# Patient Record
Sex: Female | Born: 1935 | Race: White | Hispanic: No | Smoking: Never smoker
Health system: Southern US, Community
[De-identification: ages and names within clinical notes are randomized; demographics above are authoritative.]

## PROBLEM LIST (undated history)

## (undated) ENCOUNTER — Inpatient Hospital Stay: Admission: AD | Payer: Medicare PPO | Source: Ambulatory Visit | Admitting: Internal Medicine

## (undated) DIAGNOSIS — I739 Peripheral vascular disease, unspecified: Secondary | ICD-10-CM

## (undated) DIAGNOSIS — I2510 Atherosclerotic heart disease of native coronary artery without angina pectoris: Secondary | ICD-10-CM

## (undated) DIAGNOSIS — L8990 Pressure ulcer of unspecified site, unspecified stage: Secondary | ICD-10-CM

## (undated) DIAGNOSIS — I219 Acute myocardial infarction, unspecified: Secondary | ICD-10-CM

## (undated) DIAGNOSIS — E785 Hyperlipidemia, unspecified: Secondary | ICD-10-CM

## (undated) DIAGNOSIS — I773 Arterial fibromuscular dysplasia: Secondary | ICD-10-CM

## (undated) DIAGNOSIS — I509 Heart failure, unspecified: Secondary | ICD-10-CM

## (undated) DIAGNOSIS — K219 Gastro-esophageal reflux disease without esophagitis: Secondary | ICD-10-CM

## (undated) DIAGNOSIS — K222 Esophageal obstruction: Secondary | ICD-10-CM

## (undated) DIAGNOSIS — D649 Anemia, unspecified: Secondary | ICD-10-CM

## (undated) DIAGNOSIS — K5730 Diverticulosis of large intestine without perforation or abscess without bleeding: Secondary | ICD-10-CM

## (undated) DIAGNOSIS — F419 Anxiety disorder, unspecified: Secondary | ICD-10-CM

## (undated) DIAGNOSIS — I10 Essential (primary) hypertension: Secondary | ICD-10-CM

## (undated) DIAGNOSIS — I4891 Unspecified atrial fibrillation: Secondary | ICD-10-CM

## (undated) DIAGNOSIS — S72001A Fracture of unspecified part of neck of right femur, initial encounter for closed fracture: Secondary | ICD-10-CM

## (undated) DIAGNOSIS — R0602 Shortness of breath: Secondary | ICD-10-CM

## (undated) DIAGNOSIS — J449 Chronic obstructive pulmonary disease, unspecified: Secondary | ICD-10-CM

## (undated) HISTORY — PX: CATARACT EXTRACTION: SUR2

## (undated) HISTORY — DX: Arterial fibromuscular dysplasia: I77.3

## (undated) HISTORY — DX: Essential (primary) hypertension: I10

## (undated) HISTORY — DX: Atherosclerotic heart disease of native coronary artery without angina pectoris: I25.10

## (undated) HISTORY — DX: Heart failure, unspecified: I50.9

## (undated) HISTORY — DX: Anemia, unspecified: D64.9

## (undated) HISTORY — DX: Gastro-esophageal reflux disease without esophagitis: K21.9

## (undated) HISTORY — DX: Esophageal obstruction: K22.2

## (undated) HISTORY — DX: Diverticulosis of large intestine without perforation or abscess without bleeding: K57.30

## (undated) HISTORY — DX: Pressure ulcer of unspecified site, unspecified stage: L89.90

## (undated) HISTORY — DX: Hyperlipidemia, unspecified: E78.5

## (undated) HISTORY — DX: Unspecified atrial fibrillation: I48.91

## (undated) HISTORY — DX: Chronic obstructive pulmonary disease, unspecified: J44.9

## (undated) HISTORY — DX: Peripheral vascular disease, unspecified: I73.9

## (undated) HISTORY — DX: Anxiety disorder, unspecified: F41.9

## (undated) HISTORY — DX: Fracture of unspecified part of neck of right femur, initial encounter for closed fracture: S72.001A

---

## 1998-10-08 ENCOUNTER — Other Ambulatory Visit: Admission: RE | Admit: 1998-10-08 | Discharge: 1998-10-08 | Payer: Self-pay | Admitting: Gastroenterology

## 1998-10-08 LAB — HM COLONOSCOPY

## 1999-05-29 ENCOUNTER — Encounter: Payer: Self-pay | Admitting: Emergency Medicine

## 1999-05-29 ENCOUNTER — Emergency Department (HOSPITAL_COMMUNITY): Admission: EM | Admit: 1999-05-29 | Discharge: 1999-05-29 | Payer: Self-pay | Admitting: Emergency Medicine

## 1999-10-16 ENCOUNTER — Ambulatory Visit (HOSPITAL_COMMUNITY): Admission: RE | Admit: 1999-10-16 | Discharge: 1999-10-16 | Payer: Self-pay | Admitting: Internal Medicine

## 1999-10-16 ENCOUNTER — Encounter: Payer: Self-pay | Admitting: Internal Medicine

## 2001-01-27 ENCOUNTER — Encounter: Payer: Self-pay | Admitting: Emergency Medicine

## 2001-01-27 ENCOUNTER — Emergency Department (HOSPITAL_COMMUNITY): Admission: EM | Admit: 2001-01-27 | Discharge: 2001-01-27 | Payer: Self-pay | Admitting: Emergency Medicine

## 2002-02-09 ENCOUNTER — Emergency Department (HOSPITAL_COMMUNITY): Admission: EM | Admit: 2002-02-09 | Discharge: 2002-02-09 | Payer: Self-pay | Admitting: Emergency Medicine

## 2002-10-23 ENCOUNTER — Encounter: Admission: RE | Admit: 2002-10-23 | Discharge: 2002-10-23 | Payer: Self-pay | Admitting: Internal Medicine

## 2002-10-23 ENCOUNTER — Encounter: Payer: Self-pay | Admitting: Internal Medicine

## 2003-04-21 HISTORY — PX: PTCA: SHX146

## 2003-05-16 ENCOUNTER — Emergency Department (HOSPITAL_COMMUNITY): Admission: EM | Admit: 2003-05-16 | Discharge: 2003-05-16 | Payer: Self-pay | Admitting: Emergency Medicine

## 2003-05-17 ENCOUNTER — Inpatient Hospital Stay (HOSPITAL_COMMUNITY): Admission: AD | Admit: 2003-05-17 | Discharge: 2003-05-19 | Payer: Self-pay | Admitting: Internal Medicine

## 2003-05-17 ENCOUNTER — Encounter: Payer: Self-pay | Admitting: Cardiology

## 2003-05-17 ENCOUNTER — Encounter: Payer: Self-pay | Admitting: Emergency Medicine

## 2005-01-05 ENCOUNTER — Ambulatory Visit: Payer: Self-pay | Admitting: Gastroenterology

## 2005-01-08 ENCOUNTER — Ambulatory Visit (HOSPITAL_COMMUNITY): Admission: RE | Admit: 2005-01-08 | Discharge: 2005-01-08 | Payer: Self-pay | Admitting: Gastroenterology

## 2005-02-03 ENCOUNTER — Ambulatory Visit: Payer: Self-pay | Admitting: Internal Medicine

## 2005-02-26 ENCOUNTER — Ambulatory Visit: Payer: Self-pay | Admitting: Internal Medicine

## 2005-04-08 ENCOUNTER — Emergency Department (HOSPITAL_COMMUNITY): Admission: EM | Admit: 2005-04-08 | Discharge: 2005-04-09 | Payer: Self-pay | Admitting: Emergency Medicine

## 2005-04-16 ENCOUNTER — Ambulatory Visit: Payer: Self-pay | Admitting: Internal Medicine

## 2005-10-14 ENCOUNTER — Ambulatory Visit: Payer: Self-pay | Admitting: Internal Medicine

## 2005-11-11 ENCOUNTER — Emergency Department (HOSPITAL_COMMUNITY): Admission: EM | Admit: 2005-11-11 | Discharge: 2005-11-11 | Payer: Self-pay | Admitting: Emergency Medicine

## 2005-11-30 ENCOUNTER — Ambulatory Visit: Payer: Self-pay | Admitting: Internal Medicine

## 2005-12-03 ENCOUNTER — Ambulatory Visit: Payer: Self-pay | Admitting: Internal Medicine

## 2006-02-23 ENCOUNTER — Ambulatory Visit: Payer: Self-pay | Admitting: Internal Medicine

## 2006-03-05 ENCOUNTER — Ambulatory Visit: Payer: Self-pay

## 2006-05-13 ENCOUNTER — Ambulatory Visit: Payer: Self-pay | Admitting: Internal Medicine

## 2006-11-02 ENCOUNTER — Ambulatory Visit: Payer: Self-pay | Admitting: Internal Medicine

## 2006-11-12 ENCOUNTER — Ambulatory Visit: Payer: Self-pay | Admitting: Internal Medicine

## 2006-11-24 ENCOUNTER — Ambulatory Visit: Payer: Self-pay | Admitting: Internal Medicine

## 2006-11-24 ENCOUNTER — Inpatient Hospital Stay (HOSPITAL_COMMUNITY): Admission: EM | Admit: 2006-11-24 | Discharge: 2006-11-27 | Payer: Self-pay | Admitting: Emergency Medicine

## 2007-01-18 ENCOUNTER — Ambulatory Visit: Payer: Self-pay | Admitting: Internal Medicine

## 2007-06-15 ENCOUNTER — Ambulatory Visit: Payer: Self-pay | Admitting: Internal Medicine

## 2007-06-15 LAB — CONVERTED CEMR LAB
ALT: 13 units/L (ref 0–35)
Albumin: 3.3 g/dL — ABNORMAL LOW (ref 3.5–5.2)
Alkaline Phosphatase: 67 units/L (ref 39–117)
BUN: 15 mg/dL (ref 6–23)
CO2: 33 meq/L — ABNORMAL HIGH (ref 19–32)
Calcium: 9.2 mg/dL (ref 8.4–10.5)
Eosinophils Absolute: 0.2 10*3/uL (ref 0.0–0.6)
Eosinophils Relative: 2.7 % (ref 0.0–5.0)
GFR calc Af Amer: 87 mL/min
GFR calc non Af Amer: 72 mL/min
Glucose, Bld: 105 mg/dL — ABNORMAL HIGH (ref 70–99)
HCT: 39.5 % (ref 36.0–46.0)
Ketones, ur: NEGATIVE mg/dL
Mucus, UA: NEGATIVE
Neutro Abs: 4 10*3/uL (ref 1.4–7.7)
Neutrophils Relative %: 61.5 % (ref 43.0–77.0)
Nitrite: POSITIVE — AB
Platelets: 197 10*3/uL (ref 150–400)
Sodium: 142 meq/L (ref 135–145)
Total Protein, Urine: NEGATIVE mg/dL
Urine Glucose: NEGATIVE mg/dL
Urobilinogen, UA: 1 (ref 0.0–1.0)
WBC: 6.6 10*3/uL (ref 4.5–10.5)

## 2007-10-04 ENCOUNTER — Ambulatory Visit: Payer: Self-pay | Admitting: Internal Medicine

## 2007-10-04 DIAGNOSIS — E785 Hyperlipidemia, unspecified: Secondary | ICD-10-CM

## 2007-10-04 DIAGNOSIS — I739 Peripheral vascular disease, unspecified: Secondary | ICD-10-CM

## 2007-10-04 DIAGNOSIS — K219 Gastro-esophageal reflux disease without esophagitis: Secondary | ICD-10-CM

## 2007-10-04 DIAGNOSIS — M810 Age-related osteoporosis without current pathological fracture: Secondary | ICD-10-CM | POA: Insufficient documentation

## 2007-10-04 DIAGNOSIS — I10 Essential (primary) hypertension: Secondary | ICD-10-CM | POA: Insufficient documentation

## 2007-10-04 DIAGNOSIS — K5730 Diverticulosis of large intestine without perforation or abscess without bleeding: Secondary | ICD-10-CM | POA: Insufficient documentation

## 2007-12-22 HISTORY — PX: ORIF ACETABULAR FRACTURE: SHX5029

## 2008-01-16 ENCOUNTER — Ambulatory Visit: Payer: Self-pay | Admitting: Internal Medicine

## 2008-01-16 DIAGNOSIS — J309 Allergic rhinitis, unspecified: Secondary | ICD-10-CM | POA: Insufficient documentation

## 2008-01-16 DIAGNOSIS — R42 Dizziness and giddiness: Secondary | ICD-10-CM

## 2008-01-17 ENCOUNTER — Telehealth: Payer: Self-pay | Admitting: Internal Medicine

## 2008-02-15 ENCOUNTER — Ambulatory Visit: Payer: Self-pay | Admitting: Internal Medicine

## 2008-02-15 ENCOUNTER — Ambulatory Visit: Payer: Self-pay | Admitting: Cardiology

## 2008-02-15 ENCOUNTER — Inpatient Hospital Stay (HOSPITAL_COMMUNITY): Admission: EM | Admit: 2008-02-15 | Discharge: 2008-02-23 | Payer: Self-pay | Admitting: Emergency Medicine

## 2008-02-17 ENCOUNTER — Encounter: Payer: Self-pay | Admitting: Internal Medicine

## 2008-03-20 ENCOUNTER — Inpatient Hospital Stay (HOSPITAL_COMMUNITY): Admission: EM | Admit: 2008-03-20 | Discharge: 2008-03-22 | Payer: Self-pay | Admitting: Emergency Medicine

## 2008-03-29 ENCOUNTER — Ambulatory Visit: Payer: Self-pay | Admitting: Internal Medicine

## 2008-05-01 ENCOUNTER — Encounter: Payer: Self-pay | Admitting: Internal Medicine

## 2008-05-03 ENCOUNTER — Ambulatory Visit: Payer: Self-pay | Admitting: Internal Medicine

## 2008-05-03 DIAGNOSIS — S72009A Fracture of unspecified part of neck of unspecified femur, initial encounter for closed fracture: Secondary | ICD-10-CM | POA: Insufficient documentation

## 2008-05-17 ENCOUNTER — Telehealth: Payer: Self-pay | Admitting: Internal Medicine

## 2008-05-24 ENCOUNTER — Telehealth: Payer: Self-pay | Admitting: Internal Medicine

## 2008-05-25 ENCOUNTER — Encounter: Payer: Self-pay | Admitting: Internal Medicine

## 2008-05-30 ENCOUNTER — Telehealth: Payer: Self-pay | Admitting: Internal Medicine

## 2008-05-31 ENCOUNTER — Ambulatory Visit: Payer: Self-pay | Admitting: Internal Medicine

## 2008-05-31 DIAGNOSIS — I4891 Unspecified atrial fibrillation: Secondary | ICD-10-CM | POA: Insufficient documentation

## 2008-05-31 DIAGNOSIS — F411 Generalized anxiety disorder: Secondary | ICD-10-CM | POA: Insufficient documentation

## 2008-05-31 DIAGNOSIS — I509 Heart failure, unspecified: Secondary | ICD-10-CM | POA: Insufficient documentation

## 2008-05-31 DIAGNOSIS — D649 Anemia, unspecified: Secondary | ICD-10-CM

## 2008-07-16 ENCOUNTER — Telehealth: Payer: Self-pay | Admitting: Internal Medicine

## 2008-07-24 ENCOUNTER — Telehealth: Payer: Self-pay | Admitting: Internal Medicine

## 2008-07-30 ENCOUNTER — Ambulatory Visit: Payer: Self-pay | Admitting: Internal Medicine

## 2008-07-30 ENCOUNTER — Telehealth: Payer: Self-pay | Admitting: Internal Medicine

## 2008-07-30 LAB — CONVERTED CEMR LAB
Bilirubin Urine: NEGATIVE
Ketones, ur: NEGATIVE mg/dL
Mucus, UA: NEGATIVE
Nitrite: NEGATIVE
RBC / HPF: NONE SEEN
Specific Gravity, Urine: 1.015 (ref 1.000–1.03)
Urine Glucose: NEGATIVE mg/dL
Urobilinogen, UA: 0.2 (ref 0.0–1.0)
pH: 6.5 (ref 5.0–8.0)

## 2008-09-05 ENCOUNTER — Telehealth: Payer: Self-pay | Admitting: Internal Medicine

## 2008-09-05 ENCOUNTER — Ambulatory Visit: Payer: Self-pay | Admitting: Internal Medicine

## 2008-09-05 DIAGNOSIS — M79609 Pain in unspecified limb: Secondary | ICD-10-CM

## 2008-09-27 ENCOUNTER — Telehealth: Payer: Self-pay | Admitting: Internal Medicine

## 2008-12-06 ENCOUNTER — Telehealth: Payer: Self-pay | Admitting: Internal Medicine

## 2009-02-26 ENCOUNTER — Ambulatory Visit: Payer: Self-pay | Admitting: Internal Medicine

## 2009-02-26 DIAGNOSIS — I2510 Atherosclerotic heart disease of native coronary artery without angina pectoris: Secondary | ICD-10-CM | POA: Insufficient documentation

## 2009-03-04 ENCOUNTER — Telehealth: Payer: Self-pay | Admitting: Internal Medicine

## 2009-07-10 ENCOUNTER — Encounter: Payer: Self-pay | Admitting: Internal Medicine

## 2009-07-10 ENCOUNTER — Telehealth (INDEPENDENT_AMBULATORY_CARE_PROVIDER_SITE_OTHER): Payer: Self-pay | Admitting: *Deleted

## 2009-08-13 ENCOUNTER — Ambulatory Visit: Payer: Self-pay | Admitting: Internal Medicine

## 2009-08-13 DIAGNOSIS — G4700 Insomnia, unspecified: Secondary | ICD-10-CM

## 2009-08-30 ENCOUNTER — Telehealth: Payer: Self-pay | Admitting: Internal Medicine

## 2009-09-24 ENCOUNTER — Ambulatory Visit: Payer: Self-pay | Admitting: Internal Medicine

## 2009-10-07 ENCOUNTER — Telehealth (INDEPENDENT_AMBULATORY_CARE_PROVIDER_SITE_OTHER): Payer: Self-pay | Admitting: *Deleted

## 2009-11-12 IMAGING — CT CT HEAD W/O CM
1 series · 16 of 30 positions shown, 20 images · non-contrast
Comparison: 04/08/2005

CLINICAL DATA: Fall, right hip pain.

HEAD CT WITHOUT CONTRAST
TECHNIQUE: 5mm collimated images were obtained from the base of the skull
through the vertex according to standard protocol without contrast.

[Series 2: head routine 4.8 h37s · axial · 0.43mm/px · z∈[-160,-25]mm · 16 of 30 slices shown, 20 images]
[im 2/30  brain]
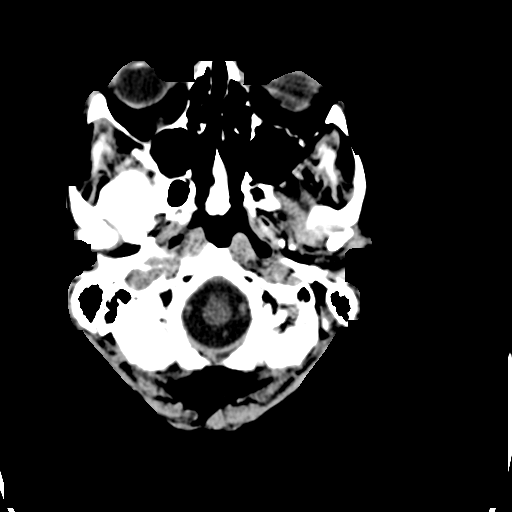
[im 2/30  bone]
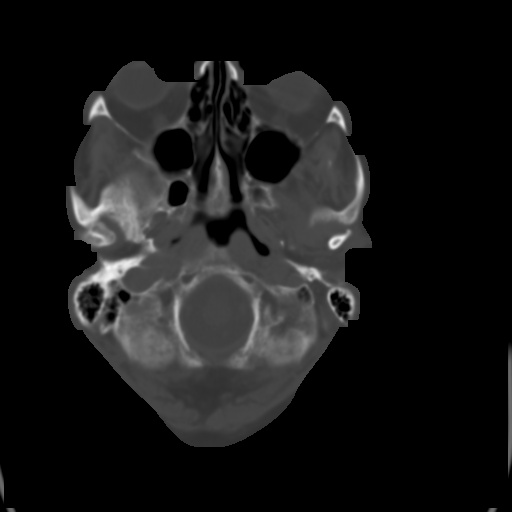
[im 4/30  brain]
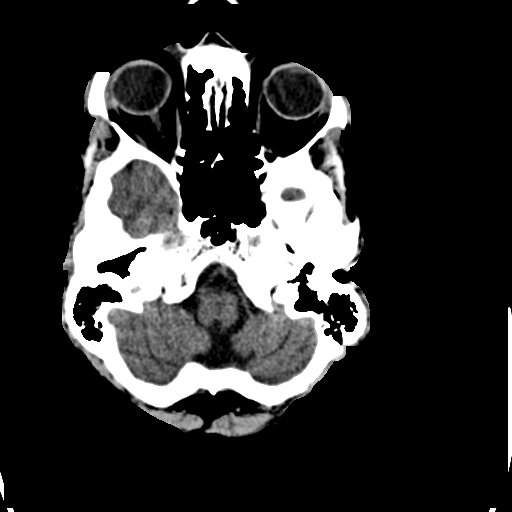
[im 6/30  brain]
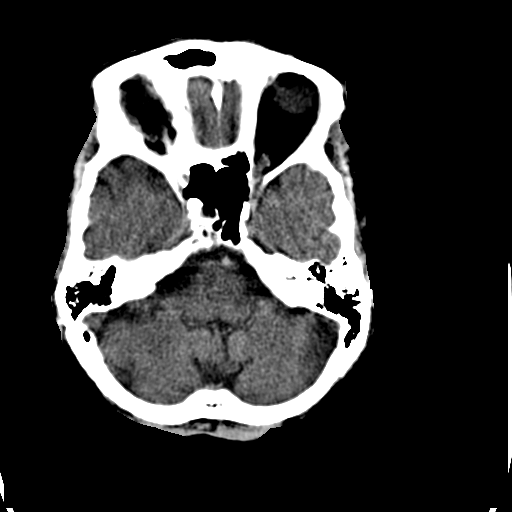
[im 8/30  brain]
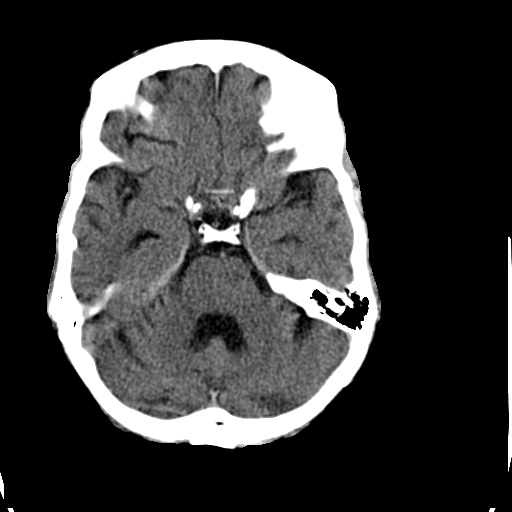
[im 9/30  brain]
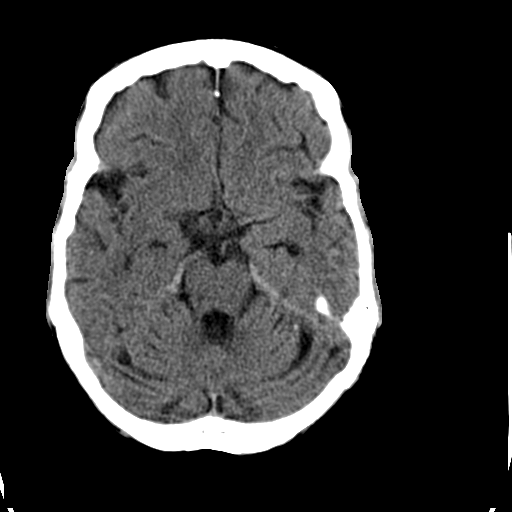
[im 9/30  bone]
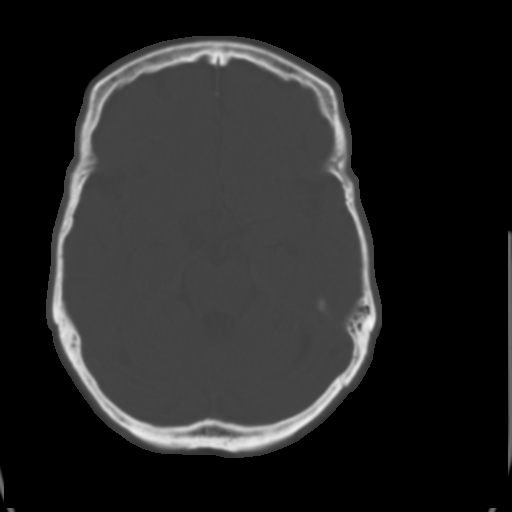
[im 11/30  brain]
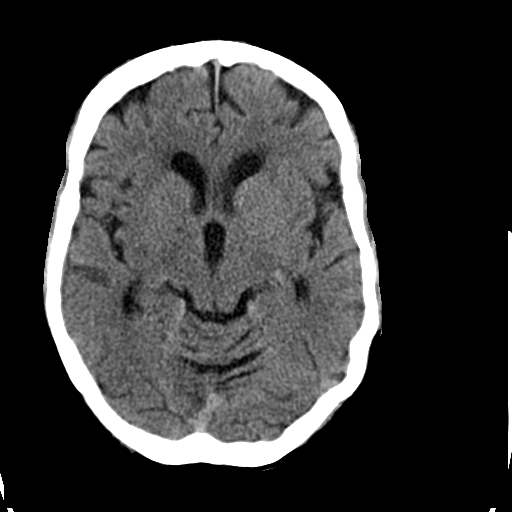
[im 13/30  brain]
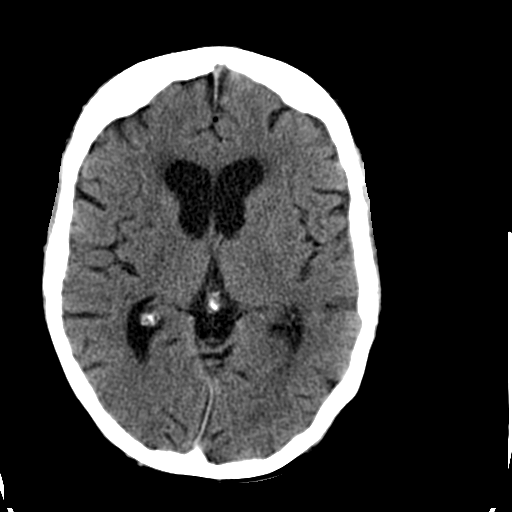
[im 15/30  brain]
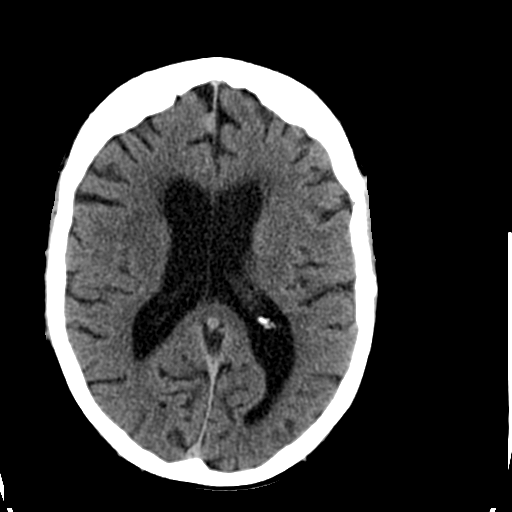
[im 16/30  brain]
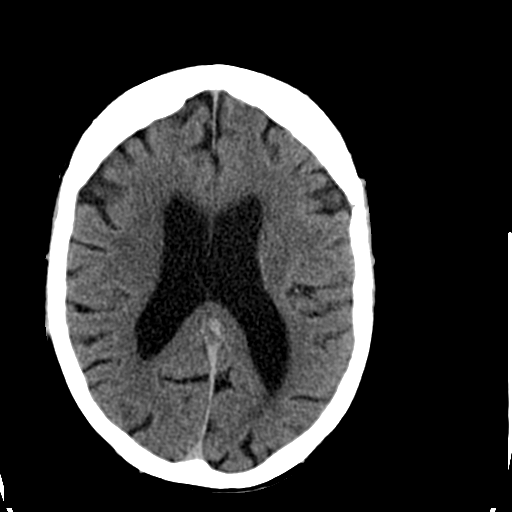
[im 16/30  bone]
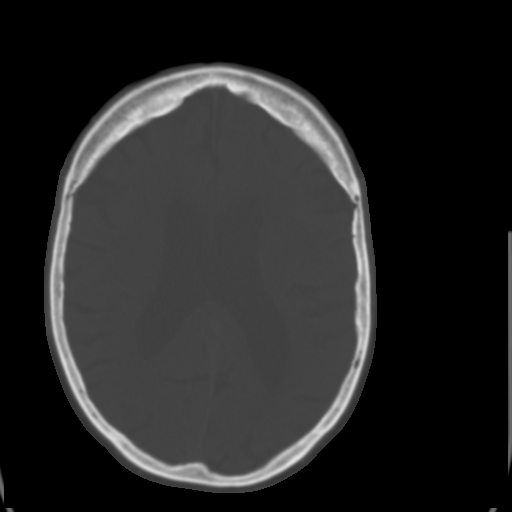
[im 18/30  brain]
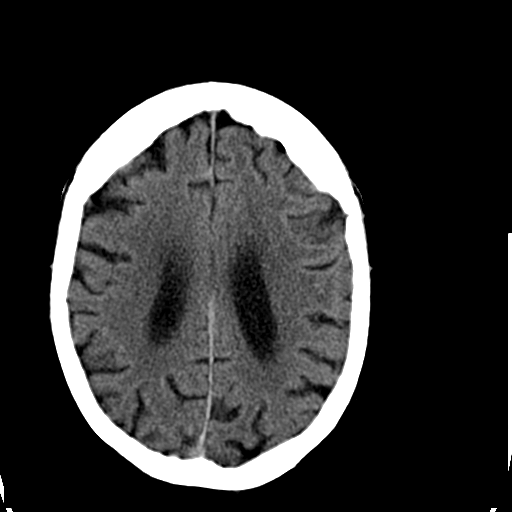
[im 20/30  brain]
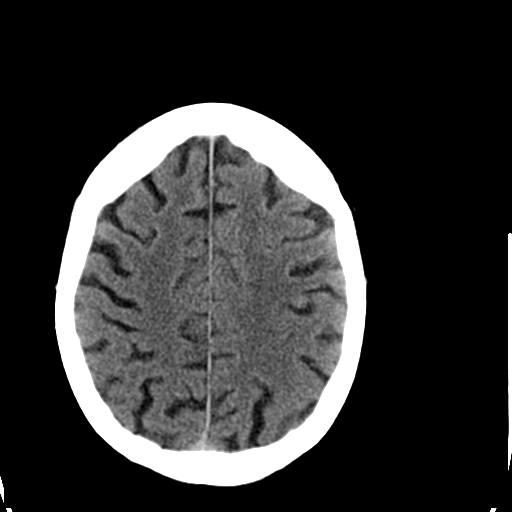
[im 22/30  brain]
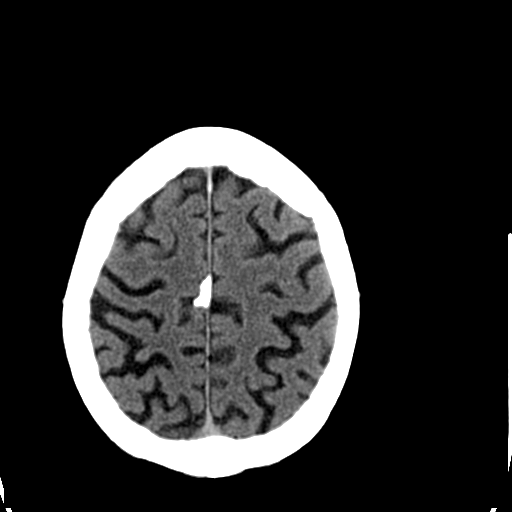
[im 23/30  brain]
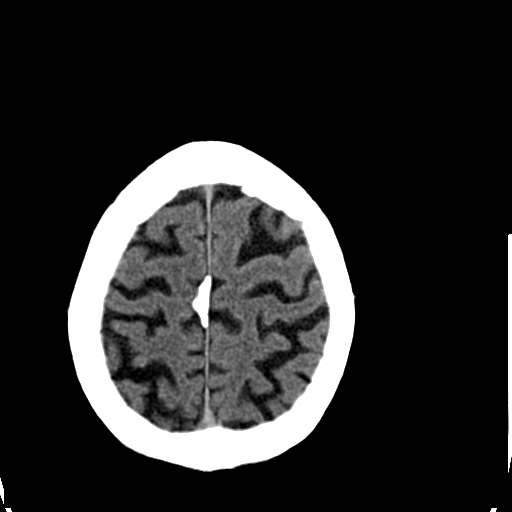
[im 23/30  bone]
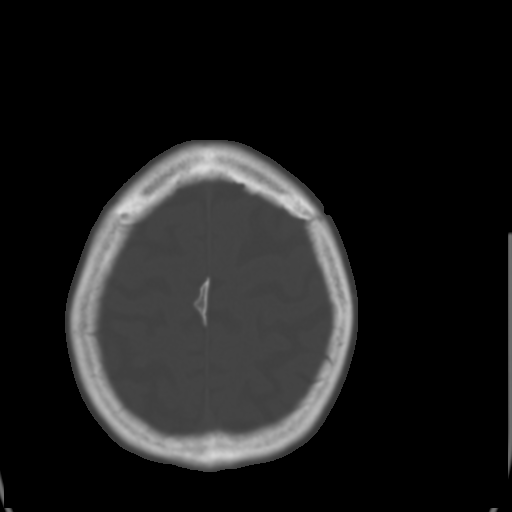
[im 25/30  brain]
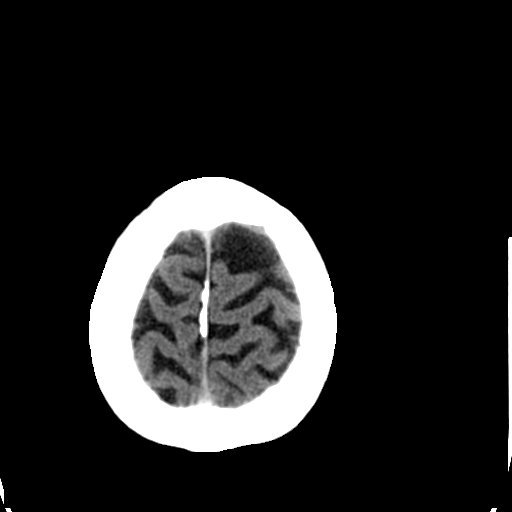
[im 27/30  brain]
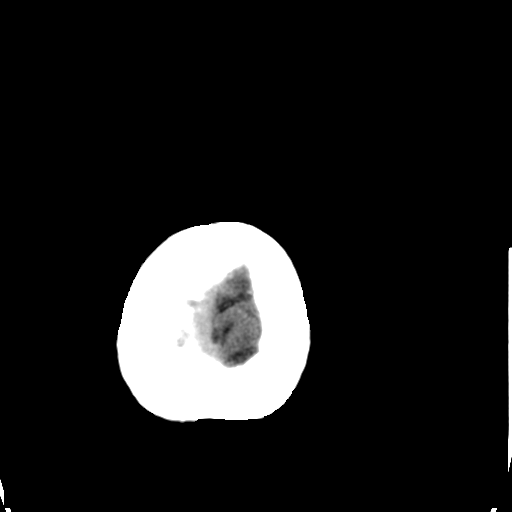
[im 29/30  brain]
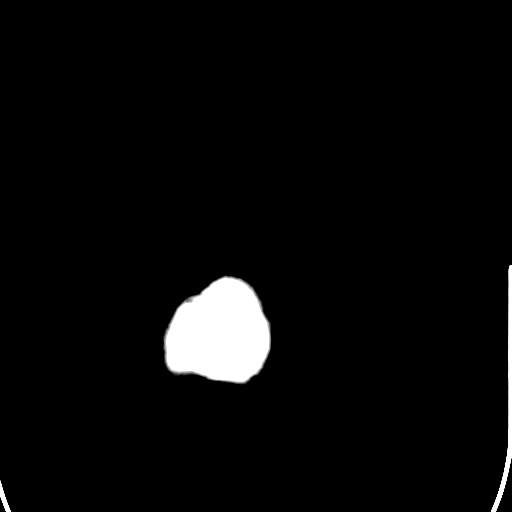

[16 of 30 positions shown; findings below may reference images not displayed]

FINDINGS: Atrophy and chronic ischemic white matter disease. No mass,
mass-effect, midline shift, hydrocephalus, or hemorrhage. No territorial
ischemia/infarct. Small low density areas are present representing dilated
perivascular spaces.

IMPRESSION
1. Atrophy and chronic ischemic white matter disease.
2. No acute intracranial abnormality.

## 2009-11-12 IMAGING — CR DG HIP COMPLETE 2+V*R*
2 series · 2 of 2 positions shown · non-contrast
Comparison: 11/24/2006.

Right hip, 3 views, 02/15/2008.
INDICATION: Fall, right hip pain, trauma.

[t pelvis a.p.]
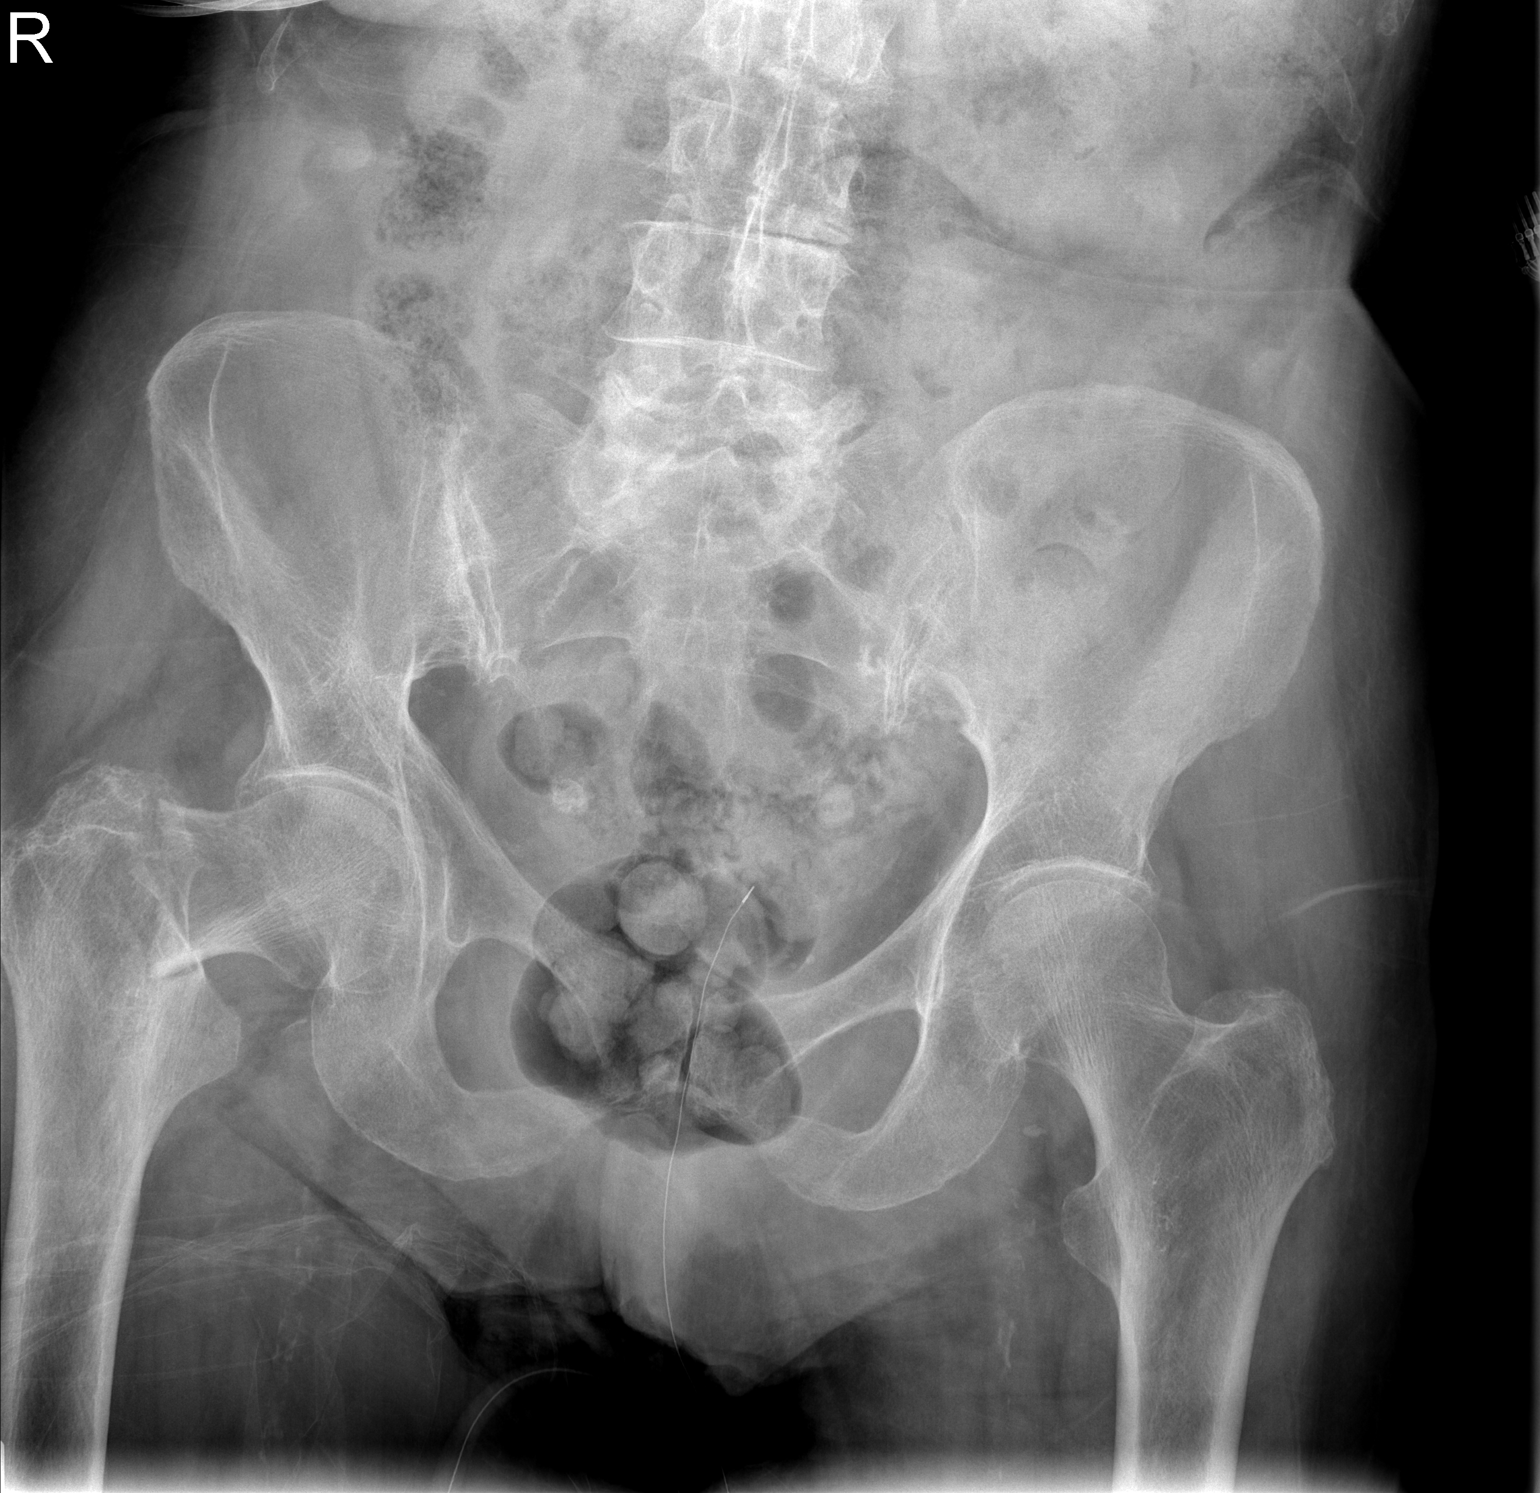

[t hip ap right]
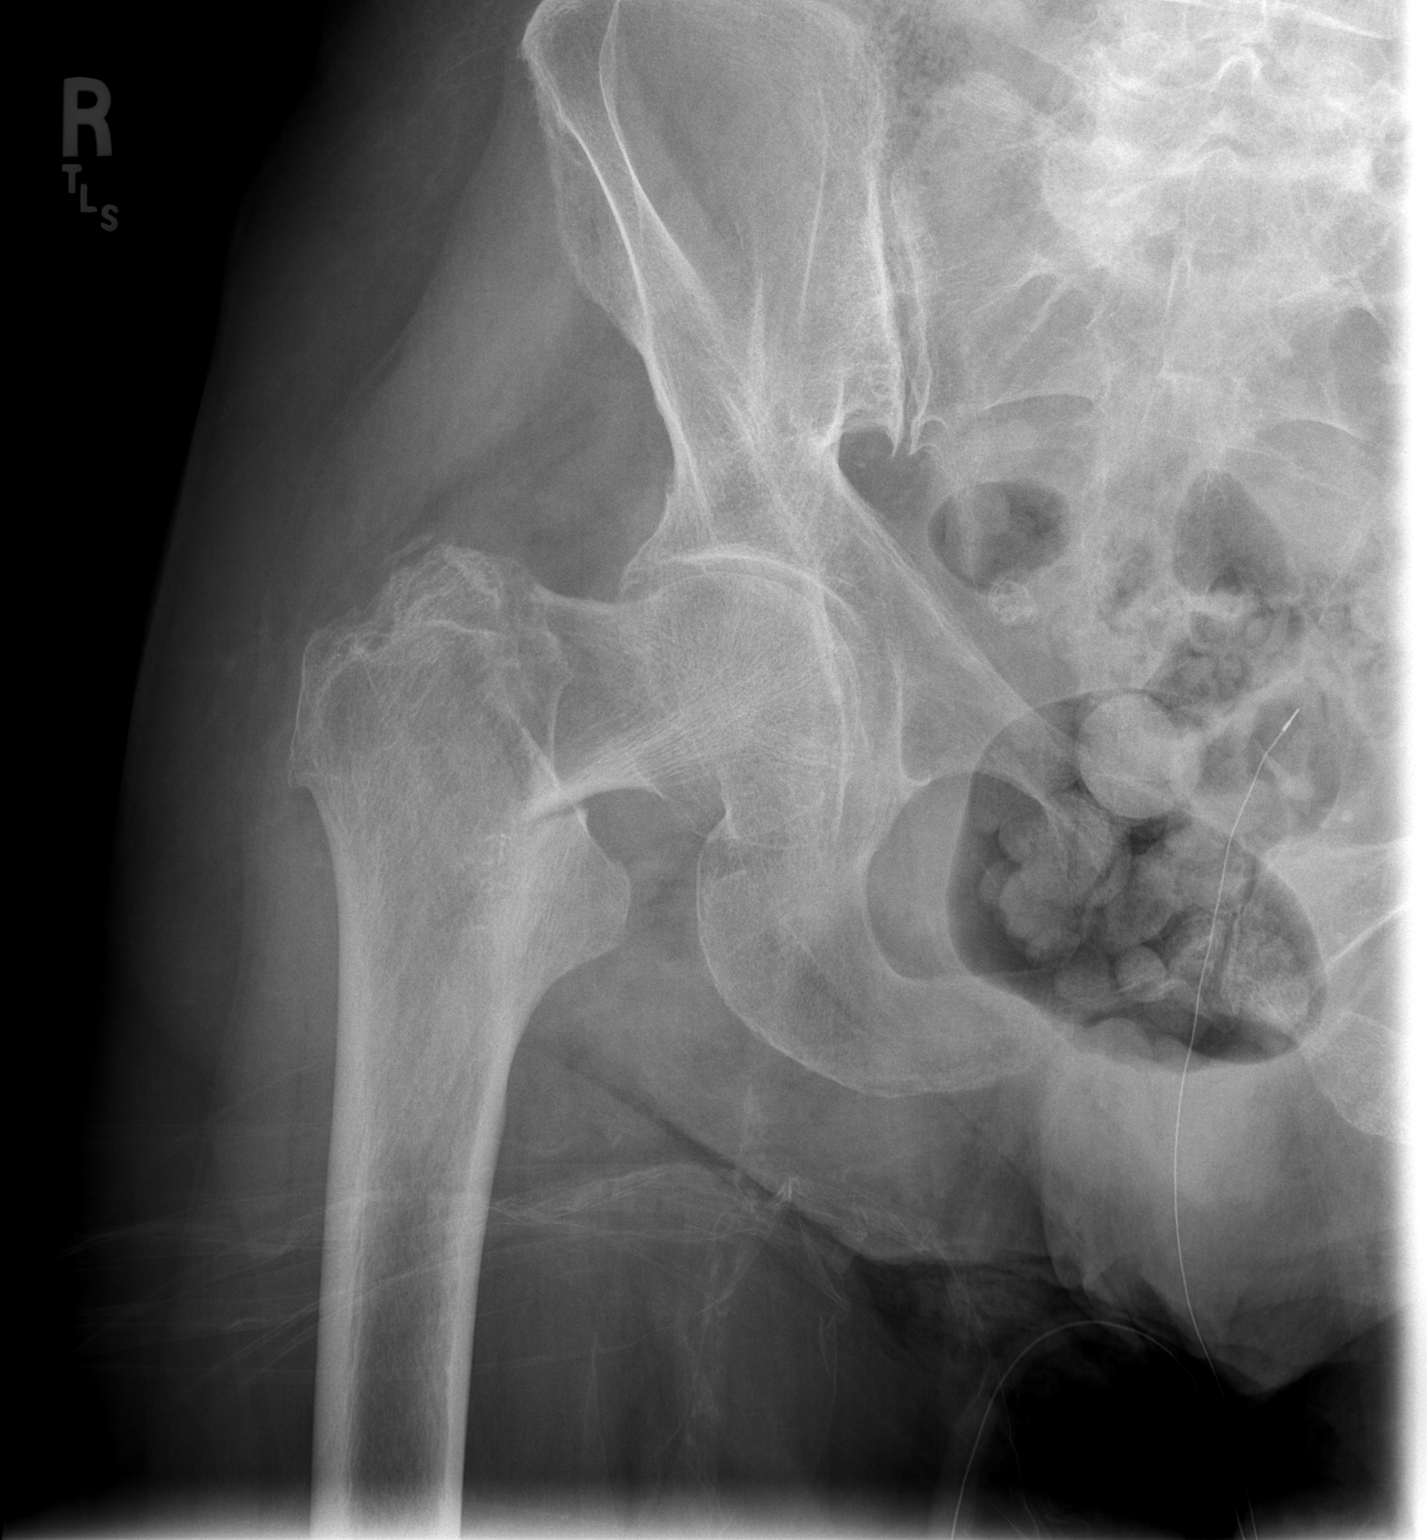

[2 of 2 positions shown; findings below may reference images not displayed]

FINDINGS: Intertrochanteric right hip fracture present, with apex superolateral
angulation. Osteopenia. Lumbosacral spondylosis. SI joint degenerative disease.
No displaced pelvic ring fractures are identified. Left hip intact. Right
femoral head located.
IMPRESSION: Intertrochanteric right hip fracture.

## 2009-11-13 IMAGING — CR DG CHEST 1V PORT
1 series · 1 of 1 positions shown · non-contrast
Comparison: 02/15/2008, [DATE] hours.

Portable supine AP chest, 02/16/2008, [DATE] hours.
INDICATION: CHF, short of breath, hypertension.

[AP]
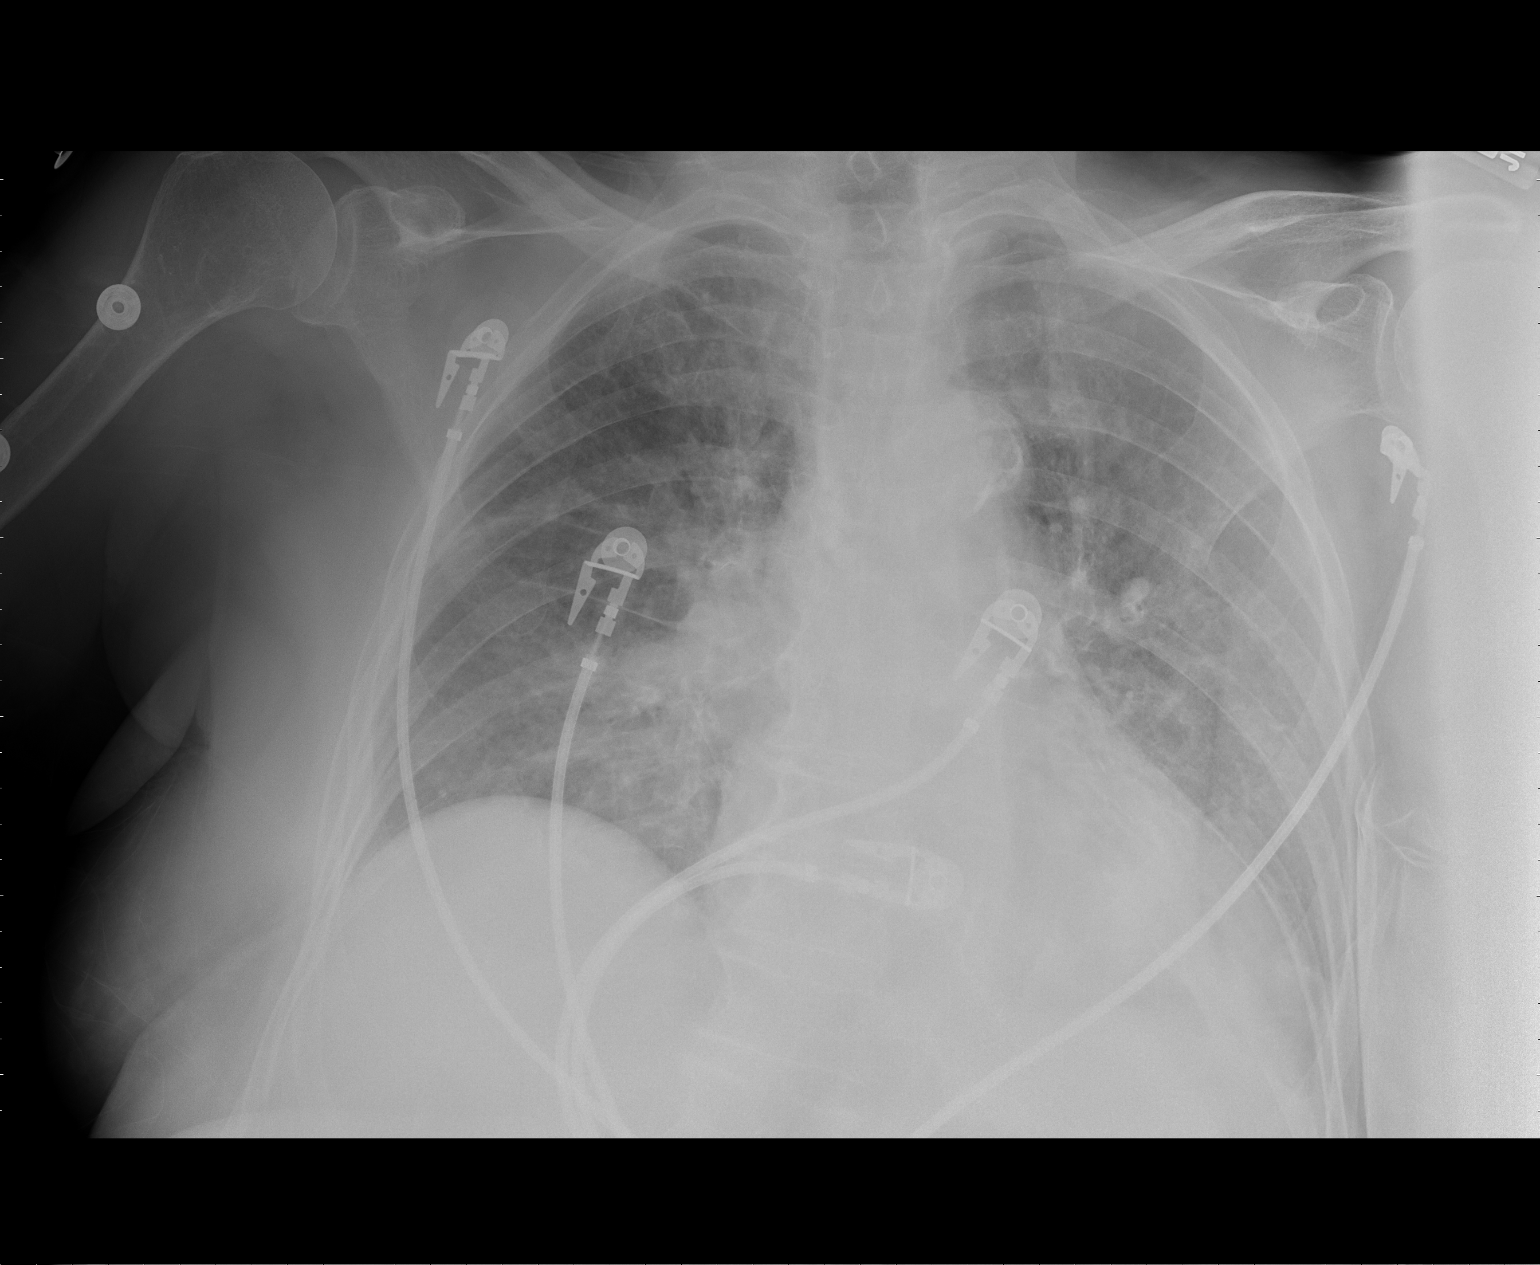

[1 of 1 positions shown; findings below may reference images not displayed]

FINDINGS: Worsening congestive heart failure is present. Bilateral perihilar
pulmonary edema is increased. Developing retrocardiac atelectasis. Small left
pleural effusion. Moderate cardiomegaly.
IMPRESSION: Worsening congestive heart failure.

## 2009-11-14 IMAGING — RF DG HIP OPERATIVE*R*
1 series · 2 of 2 positions shown · non-contrast
Comparison: Right hip x-rays 02/15/2008 and 11/24/2006.

CLINICAL DATA: Right hip fracture.

INTRAOPERATIVE RIGHT HIP - 2 VIEW 02/17/2008:

[Series 1: run · 2 of 2 slices shown]
[im 1/2]
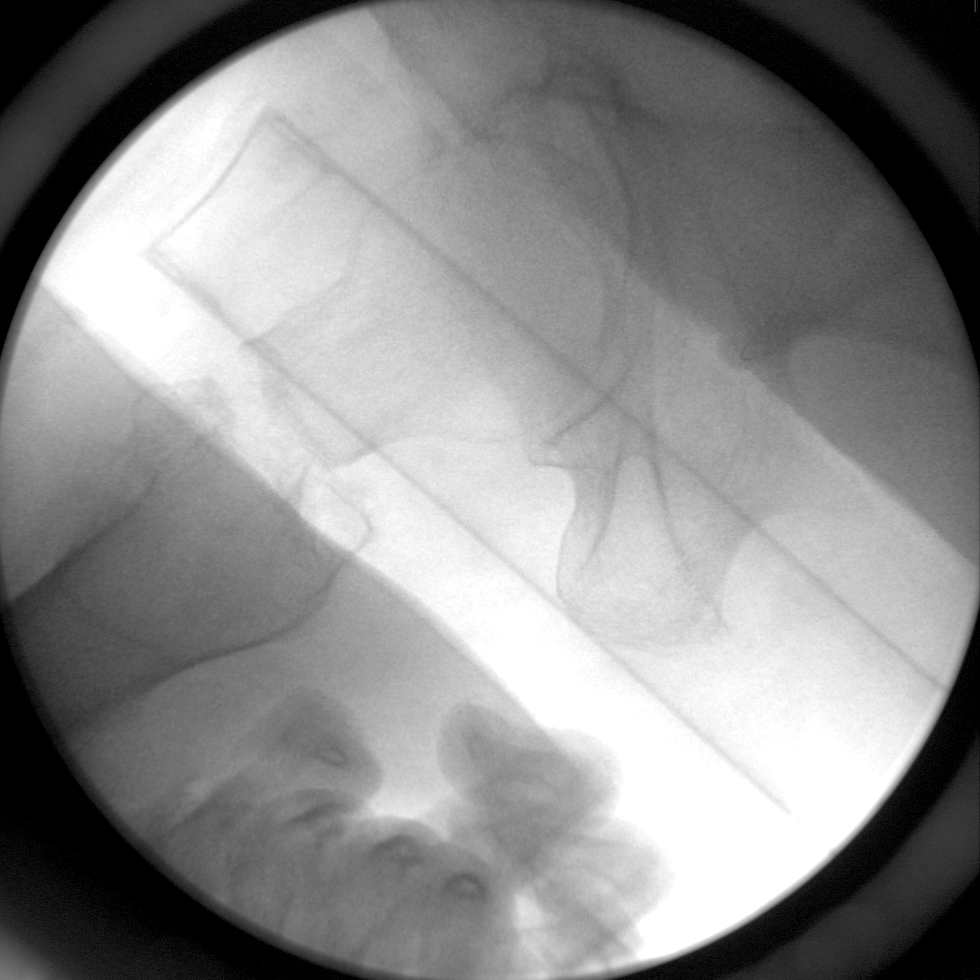
[im 2/2]
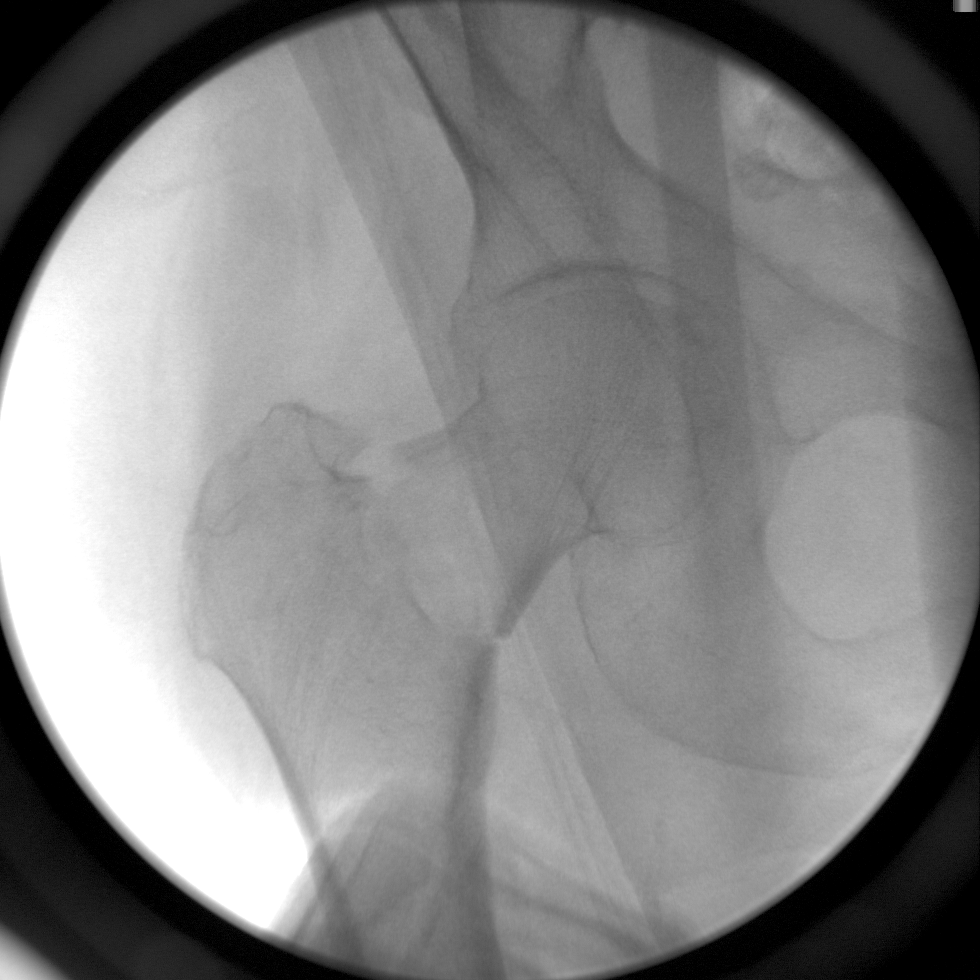

[2 of 2 positions shown; findings below may reference images not displayed]

FINDINGS: Two spot images from the C-arm fluoroscopic device presented for
interpretation post-operatively demonstrate apparent closed reduction of the
right intertrochanteric femoral neck fracture with near anatomic alignment.
IMPRESSION: Near-anatomic alignment status post closed reduction.

## 2010-01-28 ENCOUNTER — Telehealth: Payer: Self-pay | Admitting: Internal Medicine

## 2010-02-04 ENCOUNTER — Ambulatory Visit: Payer: Self-pay | Admitting: Internal Medicine

## 2010-02-04 LAB — CONVERTED CEMR LAB
ALT: 11 units/L (ref 0–35)
AST: 15 units/L (ref 0–37)
Albumin: 3.4 g/dL — ABNORMAL LOW (ref 3.5–5.2)
Alkaline Phosphatase: 52 units/L (ref 39–117)
Basophils Absolute: 0.1 10*3/uL (ref 0.0–0.1)
Bilirubin, Direct: 0.1 mg/dL (ref 0.0–0.3)
CO2: 34 meq/L — ABNORMAL HIGH (ref 19–32)
Calcium: 9.8 mg/dL (ref 8.4–10.5)
Chloride: 102 meq/L (ref 96–112)
Creatinine, Ser: 1 mg/dL (ref 0.4–1.2)
Direct LDL: 165.3 mg/dL
Eosinophils Absolute: 0.1 10*3/uL (ref 0.0–0.7)
Glucose, Bld: 94 mg/dL (ref 70–99)
HCT: 40.7 % (ref 36.0–46.0)
Lymphocytes Relative: 29.7 % (ref 12.0–46.0)
MCHC: 32.2 g/dL (ref 30.0–36.0)
MCV: 88.1 fL (ref 78.0–100.0)
Monocytes Absolute: 0.4 10*3/uL (ref 0.1–1.0)
Neutrophils Relative %: 60.7 % (ref 43.0–77.0)
Platelets: 193 10*3/uL (ref 150.0–400.0)
Sodium: 139 meq/L (ref 135–145)
Total Bilirubin: 0.3 mg/dL (ref 0.3–1.2)

## 2010-02-09 ENCOUNTER — Encounter: Payer: Self-pay | Admitting: Internal Medicine

## 2010-07-08 ENCOUNTER — Ambulatory Visit: Payer: Self-pay | Admitting: Internal Medicine

## 2010-07-08 LAB — CONVERTED CEMR LAB
Calcium: 9.5 mg/dL (ref 8.4–10.5)
Eosinophils Relative: 1.9 % (ref 0.0–5.0)
GFR calc non Af Amer: 71.09 mL/min (ref >60)
Glucose, Bld: 95 mg/dL (ref 70–99)
HCT: 42.4 % (ref 36.0–46.0)
Hemoglobin: 14.4 g/dL (ref 12.0–15.0)
Lymphocytes Relative: 22.8 % (ref 12.0–46.0)
Monocytes Absolute: 0.5 10*3/uL (ref 0.1–1.0)
Monocytes Relative: 6.3 % (ref 3.0–12.0)
Neutrophils Relative %: 68.6 % (ref 43.0–77.0)
Platelets: 210 10*3/uL (ref 150.0–400.0)
Potassium: 4.5 meq/L (ref 3.5–5.1)
Pro B Natriuretic peptide (BNP): 510.2 pg/mL — ABNORMAL HIGH (ref 0.0–100.0)

## 2010-07-12 ENCOUNTER — Encounter: Payer: Self-pay | Admitting: Internal Medicine

## 2010-07-29 ENCOUNTER — Telehealth: Payer: Self-pay | Admitting: Internal Medicine

## 2010-08-01 ENCOUNTER — Telehealth: Payer: Self-pay | Admitting: Internal Medicine

## 2011-01-20 NOTE — Letter (Signed)
[facility] [address] [place], [place]  [address] Phone: [phone]      February 10, 2010   [facility] [address] [place], [place] [address]  RE:  LAB RESULTS  Dear  Ms. Deringer,  The following is an interpretation of your most recent lab tests.  Please take note of any instructions provided or changes to medications that have resulted from your lab work.  ELECTROLYTES:  Good - no changes needed  KIDNEY FUNCTION TESTS:  Good - no changes needed  LIVER FUNCTION TESTS:  Good - no changes needed  LIPID PANEL:  Fair - review at your next visit Triglyceride: 144.0   Cholesterol: 224   HDL: 42.90   Chol/HDL%:  5  THYROID STUDIES:  Thyroid studies normal TSH: 2.65     DIABETIC STUDIES:  Excellent - no changes needed Blood Glucose: 94    CBC:  Good - no changes needed  BNP 195 - consistent with a trace of congestive heart failure   Lab results are OK. Your cholesterol, LDL, is too high. Please watch the fat in your diet.  Call or e-mail me if you have questions ([email or web address]).   Sincerely Yours,    Jacques Navy MD  tient: [name] Note: All result statuses are Final unless otherwise noted.  Tests: (1) B-Type Natiuretic Peptide (BNPR)  B-Type Natriuetic Peptide                        [H]  195.0 pg/mL                 0.0-100.0  Tests: (2) CBC Platelet w/Diff (CBCD)   White Cell Count          6.5 K/uL                    4.5-10.5   Red Cell Count            4.62 Mil/uL                 3.87-5.11   Hemoglobin                13.1 g/dL                   65.7-84.6   Hematocrit                40.7 %                      36.0-46.0   MCV                       88.1 fl                     78.0-100.0   MCHC                      32.2 g/dL                   96.2-95.2   RDW                       13.0 %                      11.5-14.6   Platelet Count            193.0 K/uL  150.0-400.0   Neutrophil %               60.7 %                      43.0-77.0   Lymphocyte %              29.7 %                      12.0-46.0   Monocyte %                6.5 %                       3.0-12.0   Eosinophils%              2.0 %                       0.0-5.0   Basophils %               1.1 %                       0.0-3.0   Neutrophill Absolute      4.0 K/uL                    1.4-7.7   Lymphocyte Absolute       1.9 K/uL                    0.7-4.0   Monocyte Absolute         0.4 K/uL                    0.1-1.0  Eosinophils, Absolute                             0.1 K/uL                    0.0-0.7   Basophils Absolute        0.1 K/uL                    0.0-0.1  Tests: (3) BMP (METABOL)   Sodium                    139 mEq/L                   135-145   Potassium                 4.5 mEq/L                   3.5-5.1   Chloride                  102 mEq/L                   96-112   Carbon Dioxide       [H]  34 mEq/L                    19-32   Glucose                   94 mg/dL  70-99   BUN                       15 mg/dL                    0-45   Creatinine                1.0 mg/dL                   4.0-9.8   Calcium                   9.8 mg/dL                   1.1-91.4   GFR                       55.01 mL/min                >60  Tests: (4) Lipid Panel (LIPID)   Cholesterol          [H]  224 mg/dL                   7-829     ATP III Classification            Desirable:  < 200 mg/dL                    Borderline High:  200 - 239 mg/dL               High:  > = 240 mg/dL   Triglycerides             144.0 mg/dL                 5.6-213.0     Normal:  <150 mg/dL     Borderline High:  865 - 199 mg/dL   HDL                       78.46 mg/dL                 >96.29   VLDL Cholesterol          28.8 mg/dL                  5.2-84.1  CHO/HDL Ratio:  CHD Risk                             5                    Men          Women     1/2 Average Risk     3.4          3.3     Average Risk          5.0           4.4     2X Average Risk          9.6          7.1     3X Average Risk          15.0          11.0  Tests: (5) Hepatic/Liver Function Panel (HEPATIC)   Total Bilirubin           0.3 mg/dL                   5.6-2.1   Direct Bilirubin          0.1 mg/dL                   3.0-8.6   Alkaline Phosphatase      52 U/L                      39-117   AST                       15 U/L                      0-37   ALT                       11 U/L                      0-35   Total Protein             6.2 g/dL                    5.7-8.4   Albumin              [L]  3.4 g/dL                    6.9-6.2  Tests: (6) Cholesterol LDL - Direct (DIRLDL)  Cholesterol LDL - Direct                             165.3 mg/dL     Optimal:  <952 mg/dL     Near or Above Optimal:  100-129 mg/dL     Borderline High:  841-324 mg/dL     High:  401-027 mg/dL     Very High:  >253 mg/dL

## 2011-01-20 NOTE — Progress Notes (Signed)
  Phone Note Refill Request Message from:  Fax from Pharmacy on July 29, 2010 1:24 PM  Refills Requested: Medication #1:  ZOLPIDEM TARTRATE 10 MG  TABS 1 by mouth at bedtime   Last Refilled: 08/13/2009 [facility] [address]. Please Advise refill. Pt also needs another alternative to clonidine. Pt states medication makes her feel "foggy" after taking. Please advise  Initial call taken by: Ami Bullins CMA,  July 29, 2010 1:24 PM  Follow-up for Phone Call        1. ok to refill zolpidem as needed 2. diltiazem ER 120mg  once a day, #30, refill prn  to replace clonidine. Will need to monitor BP at home or here. Follow-up by: Jacques Navy MD,  July 29, 2010 3:36 PM  Additional Follow-up for Phone Call Additional follow up Details #1::        informed pt    New/Updated Medications: DILTIAZEM HCL ER BEADS 120 MG XR24H-CAP (DILTIAZEM HCL ER BEADS) 1 by mouth once daily for BP (to replace clonidine) Prescriptions: DILTIAZEM HCL ER BEADS 120 MG XR24H-CAP (DILTIAZEM HCL ER BEADS) 1 by mouth once daily for BP (to replace clonidine)  #30 x 12   Entered and Authorized by:   Jacques Navy MD   Signed by:   Jacques Navy MD on 07/29/2010   Method used:   [facility]        [facility] [phone]* (retail)       [address]       [place], [place]  [address]       Ph: [phone] or [phone]       Fax: [phone]   RxID:   [id number]

## 2011-01-20 NOTE — Progress Notes (Signed)
Summary: Refill--Zolpidem  Phone Note Refill Request Message from:  Fax from Pharmacy on August 01, 2010 12:11 PM  Refills Requested: Medication #1:  ZOLPIDEM TARTRATE 10 MG  TABS 1 by mouth at bedtime Does require prior authorization and it appears that [facility] is working on that.  Next Appointment Scheduled: none Initial call taken by: Lucious Groves CMA,  August 01, 2010 12:11 PM  Follow-up for Phone Call        OK for refills as needed.  Will sign PA if on my work space Follow-up by: Jacques Navy MD,  August 01, 2010 4:34 PM    Prescriptions: ZOLPIDEM TARTRATE 10 MG  TABS (ZOLPIDEM TARTRATE) 1 by mouth at bedtime  #30 x 5   Entered by:   Lucious Groves CMA   Authorized by:   Jacques Navy MD   Signed by:   Lucious Groves CMA on 08/01/2010   Method used:   Telephoned to ...       [facility] [place] [phone]* (retail)       [phone]       [place], [place]  [address]       Ph: [phone] or [phone]       Fax: [phone]   RxID:   [id number]

## 2011-01-20 NOTE — Assessment & Plan Note (Signed)
Summary: discuss medications/#/cd   Vital Signs:  Patient profile:   77 year old female Height:      65 inches Weight:      147 pounds BMI:     24.55 O2 Sat:      96 % on Room air Temp:     96.9 degrees F oral Pulse rate:   58 / minute BP sitting:   168 / 74  (left arm) Cuff size:   regular  Vitals Entered By: Bill Salinas CMA (July 08, 2010 9:27 AM)  O2 Flow:  Room air Comments Pt states she is not taking Lasix and needs a refill on her zolpidem   Primary Care Provider:  Jhana Giarratano   History of Present Illness: Patient presents for follow-up. She c/o sinus congestion and cough for several weeks. She has rhinnorhea that is clear. She reports that she had no help with fluticasone.   She reports no interval medical problems since her last visit in February '11. she has been using her walker. She has remained independent for most of her ADLs except driving.   Reviewed her EMR and e-chart records.  Reviewed all her medications in detail along with the source of Rx's.  Discussed code status.  Current Medications (verified): 1)  Aspirin Ec 325 Mg Tbec (Aspirin) .... Take 1 Tablet By Mouth Once A Day 2)  Atenolol 100 Mg Tabs (Atenolol) .... Take 1 Tablet By Mouth Once A Day 3)  Ranitidine Hcl 150 Mg Tabs (Ranitidine Hcl) .Marland Kitchen.. 1 By Mouth Two Times A Day For Stomach 4)  Plavix 75 Mg Tabs (Clopidogrel Bisulfate) .... Take 1 Tablet By Mouth Once A Day 5)  Zolpidem Tartrate 10 Mg  Tabs (Zolpidem Tartrate) .Marland Kitchen.. 1 By Mouth At Bedtime 6)  Ferrous Sulfate 325 (65 Fe) Mg  Tabs (Ferrous Sulfate) .... Take 1 Tablet By Mouth Once A Day 7)  Lasix 20 Mg  Tabs (Furosemide) .... Take 1 Tablet By Mouth Once A Day 8)  Clonidine Hcl 0.1 Mg  Tabs (Clonidine Hcl) .Marland Kitchen.. 1 Two Times A Day 9)  Dulcolax 10 Mg  Supp (Bisacodyl) .Marland Kitchen.. 1 Pr May Repeat After 4 Hrs If No Results 10)  Gabapentin 300 Mg Caps (Gabapentin) .Marland Kitchen.. 1 By Mouth Three Times A Day 11)  Cilostazol 100 Mg Tabs (Cilostazol) .Marland Kitchen.. 1 By Mouth Two  Times A Day For Circulation 12)  Fluticasone Propionate 50 Mcg/act Susp (Fluticasone Propionate) .Marland Kitchen.. 1 Spray To Each Nostril Once A Day  Allergies (verified): 1)  ! Erythromycin 2)  ! Doxycycline 3)  Codeine Phosphate (Codeine Phosphate)  Past History:  Past Medical History: Last updated: 05/31/2008 Coronary artery disease Hyperlipidemia Osteoporosis Peripheral vascular disease Hypertension Diverticulosis, colon GERD fracture right hip '09 decubitus ulcer right heel COPD Anxiety left heel ulcer/decub Anemia-NOS hx of esophageal stricture Congestive heart failure - diastolic Atrial fibrillation hx of fibromuscular dysplasia of the renal artery  Past Surgical History: Last updated: 05/03/2008 Percutaneous transluminal coronary angioplasty -04/2003 EGD-02/14/2002 COLONOSCOPY-10/08/98 Cataract extraction-Bilat ORIF right hip '09  Family History: Last updated: 05/31/2008 non-contributory  Social History: Married '36- '43 divorced; married '46 -''82 widowed Retired- worked in Engineer, manufacturing at [employer], [employer] - Manufacturing engineer Lives alone - grandson lives with her. I-ADLs. daughter is close at hand  End-of-life: DNR, DNI, no heroic or futile.   Review of Systems  The patient denies anorexia, fever, weight loss, weight gain, decreased hearing, chest pain, syncope, dyspnea on exertion, prolonged cough, hemoptysis, abdominal pain, hematochezia, hematuria, incontinence, muscle weakness, difficulty  walking, depression, enlarged lymph nodes, and angioedema.    Physical Exam  General:  Elderly white female who looks younger than her stated age.  Head:  normocephalic and atraumatic.   Eyes:  C&S clear Neck:  supple.   Lungs:  Normal respiratory effort, chest expands symmetrically. Lungs are clear to auscultation, no crackles or wheezes. Heart:  Normal rate and regular rhythm. S1 and S2 normal without gallop, murmur, click, rub or other extra sounds. Abdomen:  soft,  non-tender, and normal bowel sounds.   Msk:  no joint tenderness, no joint swelling, no joint warmth, and no redness over joints.   Pulses:  2+ radial pulses Neurologic:  alert & oriented X3 and cranial nerves II-XII intact.  Gait is stable with the use of a walker.  Skin:  poor skin turgor and increased "wrinkles." Psych:  Oriented X3, memory intact for recent and remote, normally interactive, and good eye contact.     Impression & Recommendations:  Problem # 1:  ATRIAL FIBRILLATION (ICD-427.31) On exam her rate seemed regular vs. rate control.  Plan - continue present medications with aspirin only for anti-coagulation due to fall risk.  Her updated medication list for this problem includes:    Aspirin Ec 325 Mg Tbec (Aspirin) .Marland Kitchen... Take 1 tablet by mouth once a day    Atenolol 100 Mg Tabs (Atenolol) .Marland Kitchen... Take 1 tablet by mouth once a day    Plavix 75 Mg Tabs (Clopidogrel bisulfate) .Marland Kitchen... Take 1 tablet by mouth once a day    Cilostazol 100 Mg Tabs (Cilostazol) .Marland Kitchen... 1 by mouth two times a day for circulation  Problem # 2:  CONGESTIVE HEART FAILURE (ICD-428.0) Prior h/o CHF with no c/o SOB or DOE.   Plan - continue present medications           BNP   Her updated medication list for this problem includes:    Aspirin Ec 325 Mg Tbec (Aspirin) .Marland Kitchen... Take 1 tablet by mouth once a day    Atenolol 100 Mg Tabs (Atenolol) .Marland Kitchen... Take 1 tablet by mouth once a day    Plavix 75 Mg Tabs (Clopidogrel bisulfate) .Marland Kitchen... Take 1 tablet by mouth once a day    Lasix 20 Mg Tabs (Furosemide) .Marland Kitchen... Take 1 tablet by mouth once a day    Cilostazol 100 Mg Tabs (Cilostazol) .Marland Kitchen... 1 by mouth two times a day for circulation  Orders: TLB-BNP (B-Natriuretic Peptide) (83880-BNPR)  Problem # 3:  ANEMIA-NOS (ICD-285.9)  The following medications were removed from the medication list:    Ferrous Sulfate 325 (65 Fe) Mg Tabs (Ferrous sulfate) .Marland Kitchen... Take 1 tablet by mouth once a day  Orders: TLB-CBC Platelet -  w/Differential (85025-CBCD)  Hgb: 13.1 (02/04/2010)   Hct: 40.7 (02/04/2010)   Platelets: 193.0 (02/04/2010) RBC: 4.62 (02/04/2010)   RDW: 13.0 (02/04/2010)   WBC: 6.5 (02/04/2010) MCV: 88.1 (02/04/2010)   MCHC: 32.2 (02/04/2010) TSH: 2.65 (06/15/2007)  Plan - follow-up CBC with recommendations to follow  Problem # 4:  ALLERGIC RHINITIS (ICD-477.9) Long standing problem that is refractory to treatment  Plan - another trial of daily loratadine.  The following medications were removed from the medication list:    Fluticasone Propionate 50 Mcg/act Susp (Fluticasone propionate) .Marland Kitchen... 1 spray to each nostril once a day  Problem # 5:  HYPERTENSION (ICD-401.9)  Her updated medication list for this problem includes:    Atenolol 100 Mg Tabs (Atenolol) .Marland Kitchen... Take 1 tablet by mouth once a day  Lasix 20 Mg Tabs (Furosemide) .Marland Kitchen... Take 1 tablet by mouth once a day    Clonidine Hcl 0.1 Mg Tabs (Clonidine hcl) .Marland Kitchen... 1 two times a day  Orders: TLB-BMP (Basic Metabolic Panel-BMET) (80048-METABOL)  BP today: 168/74 Prior BP: 136/62 (02/04/2010)  Labs Reviewed: K+: 4.5 (02/04/2010) Creat: : 1.0 (02/04/2010)    Poor control today. Records indicate better control in the recent past.  Plan - continue present medications.   Problem # 6:  PERIPHERAL VASCULAR DISEASE (ICD-443.9) Previously diagnosed by [facility] as having significant PAD, but not a surgical candidate. She is not c/o too much pain and has no report of ulcerations or skin breakdown.  Plan - called in Rx for Pletal two times a day.   Problem # 7:  Preventive Health Care (ICD-V70.0) End of life care discussed. Her grandson is present. She clearly stated she did not want cardiac resuscitation or mechanical ventilation or heoric meausre. Provided signed "Out of Facility Order" and MOST form.  Complete Medication List: 1)  Aspirin Ec 325 Mg Tbec (Aspirin) .... Take 1 tablet by mouth once a day 2)  Atenolol 100 Mg Tabs (Atenolol) ....  Take 1 tablet by mouth once a day 3)  Ranitidine Hcl 150 Mg Tabs (Ranitidine hcl) .Marland Kitchen.. 1 by mouth two times a day for stomach 4)  Plavix 75 Mg Tabs (Clopidogrel bisulfate) .... Take 1 tablet by mouth once a day 5)  Zolpidem Tartrate 10 Mg Tabs (Zolpidem tartrate) .Marland Kitchen.. 1 by mouth at bedtime 6)  Lasix 20 Mg Tabs (Furosemide) .... Take 1 tablet by mouth once a day 7)  Clonidine Hcl 0.1 Mg Tabs (Clonidine hcl) .Marland Kitchen.. 1 two times a day 8)  Dulcolax 10 Mg Supp (Bisacodyl) .Marland Kitchen.. 1 pr may repeat after 4 hrs if no results 9)  Cilostazol 100 Mg Tabs (Cilostazol) .Marland Kitchen.. 1 by mouth two times a day for circulation  Patient Instructions: 1)  Cardiac - stable blood pressure and no evidence of heart failure. Plan - continue medications as listed  below. 2)  Circulation - continue medications as listed below - cilostazol. 3)  Runny nose - for cough take ro bitussin DM; for ruuny nose try taking generic Claritin (loratadine) once a day. 4)  Keep a folder with the documents I gave you handy.  Prescriptions: CILOSTAZOL 100 MG TABS (CILOSTAZOL) 1 by mouth two times a day for circulation  #60 x 12   Entered and Authorized by:   Jacques Navy MD   Signed by:   Jacques Navy MD on 07/08/2010   Method used:   [facility]        [facility] [phone]* (retail)       [address]       [place], [place]  [address]       Ph: [phone] or [phone]       Fax: [phone]   RxID:   [id number]

## 2011-01-20 NOTE — Progress Notes (Signed)
  Phone Note Refill Request Message from:  Fax from Pharmacy on January 28, 2010 3:45 PM  Refills Requested: Medication #1:  PLAVIX 75 MG TABS Take 1 tablet by mouth once a day Initial call taken by: Ami Bullins CMA,  January 28, 2010 3:45 PM    Prescriptions: PLAVIX 75 MG TABS (CLOPIDOGREL BISULFATE) Take 1 tablet by mouth once a day  #30 Tablet x 5   Entered by:   Ami Bullins CMA   Authorized by:   Jacques Navy MD   Signed by:   Bill Salinas CMA on 01/28/2010   Method used:   [facility] [phone]* (retail)       [address]       [place], [place]  [address]       Ph: [phone] or [phone]       Fax: [phone]   RxID:   [id number]

## 2011-01-20 NOTE — Assessment & Plan Note (Signed)
Summary: f/u appt/ # / cd   Vital Signs:  Patient profile:   77 year old female Height:      65 inches Weight:      146 pounds O2 Sat:      97 % on Room air Temp:     97.6 degrees F oral Pulse rate:   53 / minute BP sitting:   136 / 62  (left arm) Cuff size:   regular  Vitals Entered By: Bill Salinas CMA (February 04, 2010 11:16 AM)  O2 Flow:  Room air CC: pt here for follow up on BP and wants to discuss medications/ ab   Primary Care Provider:  Aeric Burnham  CC:  pt here for follow up on BP and wants to discuss medications/ ab.  History of Present Illness: Patient presents for allergic rhinnitis - she c/o rhinorrhea, with no other symptoms. Loratadine is on her med list but she says she is not taking this. She says that this did not work and she also failed zyrtec.  She did have an ear infection for which she saw Dr. Haroldine Laws: she had irrigation and he put her on  antibiotics.   She reports that she has otherwise been doing well.  Current Medications (verified): 1)  Aspirin Ec 325 Mg Tbec (Aspirin) .... Take 1 Tablet By Mouth Once A Day 2)  Atenolol 100 Mg Tabs (Atenolol) .... Take 1 Tablet By Mouth Once A Day 3)  Ranitidine Hcl 150 Mg Tabs (Ranitidine Hcl) .Marland Kitchen.. 1 By Mouth Two Times A Day For Stomach 4)  Plavix 75 Mg Tabs (Clopidogrel Bisulfate) .... Take 1 Tablet By Mouth Once A Day 5)  Zolpidem Tartrate 10 Mg  Tabs (Zolpidem Tartrate) .Marland Kitchen.. 1 By Mouth At Bedtime 6)  Ferrous Sulfate 325 (65 Fe) Mg  Tabs (Ferrous Sulfate) .... Take 1 Tablet By Mouth Once A Day 7)  Lasix 20 Mg  Tabs (Furosemide) .... Take 1 Tablet By Mouth Once A Day 8)  Clonidine Hcl 0.1 Mg  Tabs (Clonidine Hcl) .Marland Kitchen.. 1 Two Times A Day 9)  Dulcolax 10 Mg  Supp (Bisacodyl) .Marland Kitchen.. 1 Pr May Repeat After 4 Hrs If No Results 10)  Gabapentin 300 Mg Caps (Gabapentin) .Marland Kitchen.. 1 By Mouth Three Times A Day 11)  Cilostazol 100 Mg Tabs (Cilostazol) .Marland Kitchen.. 1 By Mouth Two Times A Day For Circulation 12)  Gnp Loratadine 10 Mg Tabs  (Loratadine) .Marland Kitchen.. 1 By Mouth Once Daily For Allergy  Allergies (verified): 1)  ! Erythromycin 2)  ! Doxycycline 3)  Codeine Phosphate (Codeine Phosphate)  Past History:  Past Medical History: Last updated: 05/31/2008 Coronary artery disease Hyperlipidemia Osteoporosis Peripheral vascular disease Hypertension Diverticulosis, colon GERD fracture right hip '09 decubitus ulcer right heel COPD Anxiety left heel ulcer/decub Anemia-NOS hx of esophageal stricture Congestive heart failure - diastolic Atrial fibrillation hx of fibromuscular dysplasia of the renal artery  Past Surgical History: Last updated: 05/03/2008 Percutaneous transluminal coronary angioplasty -04/2003 EGD-02/14/2002 COLONOSCOPY-10/08/98 Cataract extraction-Bilat ORIF right hip '09  Family History: Last updated: 05/31/2008 non-contributory  Social History: Last updated: 05/03/2008 Retired Never Smoked widowed.  Lives alone - daughter is close at hand   Risk Factors: Caffeine Use: 1 (01/16/2008) Exercise: no (01/16/2008)  Risk Factors: Smoking Status: never (10/04/2007)  Review of Systems  The patient denies anorexia, fever, weight loss, weight gain, vision loss, hoarseness, chest pain, dyspnea on exertion, peripheral edema, headaches, abdominal pain, hematochezia, hematuria, muscle weakness, transient blindness, difficulty walking, depression, abnormal bleeding, and breast masses.  Physical Exam  General:  WNWD white female in no distress Head:  no sinus tenderness to percussion Eyes:  C&S clear Neck:  full ROM and no thyromegaly.   Lungs:  normal respiratory effort, normal breath sounds, no crackles, and no wheezes.   Heart:  normal rate and regular rhythm.   Abdomen:  soft, non-tender, and normal bowel sounds.   Msk:  normal ROM, no joint tenderness, no joint swelling, and no joint warmth.   Neurologic:  alert & oriented X3, cranial nerves II-XII intact, strength normal in all  extremities, and gait normal.   Skin:  turgor normal, color normal, and no rashes.   Cervical Nodes:  no anterior cervical adenopathy and no posterior cervical adenopathy.   Psych:  Oriented X3, memory intact for recent and remote, normally interactive, and good eye contact.     Impression & Recommendations:  Problem # 1:  ALLERGIC RHINITIS (ICD-477.9)  Patient with continued rhinnitis with no response to antihistamines.  Plan - trial of Fluticasone 1 spray to each nostril once a day  The following medications were removed from the medication list:    Gnp Loratadine 10 Mg Tabs (Loratadine) .Marland Kitchen... 1 by mouth once daily for allergy Her updated medication list for this problem includes:    Fluticasone Propionate 50 Mcg/act Susp (Fluticasone propionate) .Marland Kitchen... 1 spray to each nostril once a day  Orders: Prescription Created Electronically [phone])  Problem # 2:  CONGESTIVE HEART FAILURE (ICD-428.0)  Lungs are clear. No evidence of decompensation.  Plan - continue taking present medications.  Her updated medication list for this problem includes:    Aspirin Ec 325 Mg Tbec (Aspirin) .Marland Kitchen... Take 1 tablet by mouth once a day    Atenolol 100 Mg Tabs (Atenolol) .Marland Kitchen... Take 1 tablet by mouth once a day    Plavix 75 Mg Tabs (Clopidogrel bisulfate) .Marland Kitchen... Take 1 tablet by mouth once a day    Lasix 20 Mg Tabs (Furosemide) .Marland Kitchen... Take 1 tablet by mouth once a day    Cilostazol 100 Mg Tabs (Cilostazol) .Marland Kitchen... 1 by mouth two times a day for circulation  Orders: TLB-BNP (B-Natriuretic Peptide) (83880-BNPR)  Problem # 3:  GERD (ICD-530.81) C/o of acid reflux. she does say she is taking ranitidine.  Plan - ranitidine  twice a day. If this fails to control symptoms can consider PPI therapy.   Her updated medication list for this problem includes:    Ranitidine Hcl 150 Mg Tabs (Ranitidine hcl) .Marland Kitchen... 1 by mouth two times a day for stomach  Problem # 4:  HYPERTENSION (ICD-401.9)  Her updated  medication list for this problem includes:    Atenolol 100 Mg Tabs (Atenolol) .Marland Kitchen... Take 1 tablet by mouth once a day    Lasix 20 Mg Tabs (Furosemide) .Marland Kitchen... Take 1 tablet by mouth once a day    Clonidine Hcl 0.1 Mg Tabs (Clonidine hcl) .Marland Kitchen... 1 two times a day  BP today: 136/62 Prior BP: 268/90 (09/24/2009)  Good control on present medications  Plan - continue present meds.   Orders: TLB-BMP (Basic Metabolic Panel-BMET) (80048-METABOL)  Problem # 5:  HYPERLIPIDEMIA (ICD-272.4)  Patient hasn'[t had a lipid panel since '08 with a history of CAD/CHF.  Plan - lipid panel  Orders: TLB-Lipid Panel (80061-LIPID) TLB-Hepatic/Liver Function Pnl (80076-HEPATIC)  Problem # 6:  PERIPHERAL VASCULAR DISEASE (ICD-443.9) has chronic lower extremity pain despite use of cilostazaol. She is not a surgerical candidate.  Plan - continue present medications.  Complete Medication List: 1)  Aspirin Ec 325 Mg Tbec (Aspirin) .... Take 1 tablet by mouth once a day 2)  Atenolol 100 Mg Tabs (Atenolol) .... Take 1 tablet by mouth once a day 3)  Ranitidine Hcl 150 Mg Tabs (Ranitidine hcl) .Marland Kitchen.. 1 by mouth two times a day for stomach 4)  Plavix 75 Mg Tabs (Clopidogrel bisulfate) .... Take 1 tablet by mouth once a day 5)  Zolpidem Tartrate 10 Mg Tabs (Zolpidem tartrate) .Marland Kitchen.. 1 by mouth at bedtime 6)  Ferrous Sulfate 325 (65 Fe) Mg Tabs (Ferrous sulfate) .... Take 1 tablet by mouth once a day 7)  Lasix 20 Mg Tabs (Furosemide) .... Take 1 tablet by mouth once a day 8)  Clonidine Hcl 0.1 Mg Tabs (Clonidine hcl) .Marland Kitchen.. 1 two times a day 9)  Dulcolax 10 Mg Supp (Bisacodyl) .Marland Kitchen.. 1 pr may repeat after 4 hrs if no results 10)  Gabapentin 300 Mg Caps (Gabapentin) .Marland Kitchen.. 1 by mouth three times a day 11)  Cilostazol 100 Mg Tabs (Cilostazol) .Marland Kitchen.. 1 by mouth two times a day for circulation 12)  Fluticasone Propionate 50 Mcg/act Susp (Fluticasone propionate) .Marland Kitchen.. 1 spray to each nostril once a day  Other Orders: TLB-CBC  Platelet - w/Differential (85025-CBCD)  Patient Instructions: 1)  Allergic rhinnitis with nasal drainage - failed to get relief with claritin or zyrtec. Plan - try fluticasone nasal spray - one spray to each nostril once a day. If it works you may be able to get by using the medication three times a week. Watch for nosebleeds - if that happens stop the medication for a week and then resume. Watch for "thrush" - you can avoid this by rinsing your mouth after use.  2)  Acid reflux - take the rantidine AM and bedtime. Pantoprazole is stronger but very expensive and I would only take it if the ranitidine doesn't work.  3)  Blood pressure - looks good continue taking present medication.  4)  Routine lab - will check on routine lab since it hasn't been checked since the summer of '08 Prescriptions: FLUTICASONE PROPIONATE 50 MCG/ACT SUSP (FLUTICASONE PROPIONATE) 1 spray to each nostril once a day  #1 x 12   Entered and Authorized by:   Jacques Navy MD   Signed by:   Jacques Navy MD on 02/04/2010   Method used:   Electronically to        [facility] [phone]* (retail)       [address]       [place], [place]  [address]       Ph: [phone] or [phone]       Fax: [phone]   RxID:   [id number]

## 2011-01-20 NOTE — Letter (Signed)
[facility] [address] [place], [place]  [address] Phone: [phone]      July 12, 2010   [facility] [address] [place], [place] [address]  RE:  LAB RESULTS  Dear  [name],  The following is an interpretation of your most recent lab tests.  Please take note of any instructions provided or changes to medications that have resulted from your lab work.  ELECTROLYTES:  Good - no changes needed  KIDNEY FUNCTION TESTS:  Good - no changes needed    DIABETIC STUDIES:  Excellent - no changes needed Blood Glucose: 95    CBC:  Good - no changes needed   Sincerely Yours,    Jacques Navy MD  Patient: [name] Note: All result statuses are Final unless otherwise noted.  Tests: (1) CBC Platelet w/Diff (CBCD)   White Cell Count          7.9 K/uL                    4.5-10.5   Red Cell Count            4.92 Mil/uL                 3.87-5.11   Hemoglobin                14.4 g/dL                   29.5-62.1   Hematocrit                42.4 %                      36.0-46.0   MCV                       86.1 fl                     78.0-100.0   MCHC                      34.1 g/dL                   30.8-65.7   RDW                       14.5 %                      11.5-14.6   Platelet Count            210.0 K/uL                  150.0-400.0   Neutrophil %              68.6 %                      43.0-77.0   Lymphocyte %              22.8 %                      12.0-46.0   Monocyte %                6.3 %  3.0-12.0   Eosinophils%              1.9 %                       0.0-5.0   Basophils %               0.4 %                       0.0-3.0   Neutrophill Absolute      5.4 K/uL                    1.4-7.7   Lymphocyte Absolute       1.8 K/uL                    0.7-4.0   Monocyte Absolute         0.5 K/uL                    0.1-1.0  Eosinophils, Absolute                             0.2 K/uL                    0.0-0.7   Basophils  Absolute        0.0 K/uL                    0.0-0.1  Tests: (2) B-Type Natiuretic Peptide (BNPR)  B-Type Natriuetic Peptide                        [H]  510.2 pg/mL                 0.0-100.0  Tests: (3) BMP (METABOL)   Sodium                    139 mEq/L                   135-145   Potassium                 4.5 mEq/L                   3.5-5.1   Chloride                  104 mEq/L                   96-112   Carbon Dioxide            31 mEq/L                    19-32   Glucose                   95 mg/dL                    04-54   BUN                       17 mg/dL                    0-98   Creatinine                0.8 mg/dL  0.4-1.2   Calcium                   9.5 mg/dL                   1.6-10.9   GFR                       71.09 mL/min                >60

## 2011-03-17 ENCOUNTER — Ambulatory Visit: Payer: Self-pay | Admitting: Internal Medicine

## 2011-03-30 ENCOUNTER — Encounter: Payer: Self-pay | Admitting: Internal Medicine

## 2011-03-30 ENCOUNTER — Ambulatory Visit (INDEPENDENT_AMBULATORY_CARE_PROVIDER_SITE_OTHER): Payer: Medicare Other | Admitting: Internal Medicine

## 2011-03-30 ENCOUNTER — Other Ambulatory Visit (INDEPENDENT_AMBULATORY_CARE_PROVIDER_SITE_OTHER): Payer: Medicare Other

## 2011-03-30 DIAGNOSIS — I10 Essential (primary) hypertension: Secondary | ICD-10-CM

## 2011-03-30 DIAGNOSIS — D649 Anemia, unspecified: Secondary | ICD-10-CM

## 2011-03-30 DIAGNOSIS — R5381 Other malaise: Secondary | ICD-10-CM

## 2011-03-30 DIAGNOSIS — I509 Heart failure, unspecified: Secondary | ICD-10-CM

## 2011-03-30 DIAGNOSIS — R5383 Other fatigue: Secondary | ICD-10-CM

## 2011-03-30 LAB — COMPREHENSIVE METABOLIC PANEL WITH GFR
ALT: 13 U/L (ref 0–35)
AST: 18 U/L (ref 0–37)
Albumin: 3.4 g/dL — ABNORMAL LOW (ref 3.5–5.2)
Alkaline Phosphatase: 55 U/L (ref 39–117)
BUN: 17 mg/dL (ref 6–23)
CO2: 32 meq/L (ref 19–32)
Calcium: 9.6 mg/dL (ref 8.4–10.5)
Chloride: 98 meq/L (ref 96–112)
Creatinine, Ser: 0.9 mg/dL (ref 0.4–1.2)
GFR: 60.41 mL/min
Glucose, Bld: 101 mg/dL — ABNORMAL HIGH (ref 70–99)
Potassium: 4.5 meq/L (ref 3.5–5.1)
Sodium: 136 meq/L (ref 135–145)
Total Bilirubin: 0.7 mg/dL (ref 0.3–1.2)
Total Protein: 6.3 g/dL (ref 6.0–8.3)

## 2011-03-30 LAB — TSH: TSH: 3.11 u[IU]/mL (ref 0.35–5.50)

## 2011-03-30 LAB — CBC WITH DIFFERENTIAL/PLATELET
Basophils Absolute: 0 10*3/uL (ref 0.0–0.1)
Basophils Relative: 0.5 % (ref 0.0–3.0)
Eosinophils Absolute: 0.1 10*3/uL (ref 0.0–0.7)
HCT: 42 % (ref 36.0–46.0)
Lymphocytes Relative: 27.2 % (ref 12.0–46.0)
Monocytes Relative: 7.5 % (ref 3.0–12.0)
Neutrophils Relative %: 63.3 % (ref 43.0–77.0)
Platelets: 210 10*3/uL (ref 150.0–400.0)

## 2011-03-30 LAB — BRAIN NATRIURETIC PEPTIDE: Pro B Natriuretic peptide (BNP): 580.9 pg/mL — ABNORMAL HIGH (ref 0.0–100.0)

## 2011-03-30 MED ORDER — ATENOLOL 50 MG PO TABS: 50.0000 mg | ORAL_TABLET | Freq: Every day | ORAL | Status: DC

## 2011-03-30 MED ORDER — FLUTICASONE PROPIONATE 50 MCG/ACT NA SUSP: 1.0000 | Freq: Two times a day (BID) | NASAL | Status: DC

## 2011-03-30 MED ORDER — FUROSEMIDE 20 MG PO TABS: 20.0000 mg | ORAL_TABLET | Freq: Every day | ORAL | Status: DC

## 2011-03-30 MED ORDER — DILTIAZEM HCL ER BEADS 120 MG PO CP24: 120.0000 mg | ORAL_CAPSULE | Freq: Every day | ORAL | Status: DC

## 2011-03-30 NOTE — Progress Notes (Signed)
Subjective:    Patient ID: [name], female    DOB: 16-May-1915, 77 y.o.   MRN: [id number]  HPI  [name] was last seen July '11. She is followed for CHF, a. Fib, HTN, chronic dizziness. She has now given up driving for 2 years. She c/o increased weakness and fatigue but she has had no serious illness, repeat hospitalizations or other doctor visits.  He level of activity and independence in all ADLs except driving remains unchanged. She is not limited by DOE, chest pain or focal medical symptoms.  She presents today for a greater than 1 year h/o cough and rhinorrhea for 1 year. She has had not had any fever, no productive sputum. She has tried otc products: zyrtec, claritin with no relief. At her last office visit she reported lack of symptom relief with flonase nasal spray. She has had no respiratory symptoms, no purulent sputum, no fevers, sweats or chills.  Past Medical History  Diagnosis Date  . CAD (coronary artery disease)   . Hyperlipidemia   . Osteoporosis   . PVD (peripheral vascular disease)   . HTN (hypertension)   . Diverticulosis of colon   . GERD (gastroesophageal reflux disease)   . Hip fracture, right   . Decubitus ulcer     Right heel  . COPD (chronic obstructive pulmonary disease)   . Anxiety   . Anemia   . Esophageal stricture   . CHF (congestive heart failure)   . Atrial fibrillation   . Fibromuscular dysplasia     Renal artery   Past Surgical History  Procedure Date  . Ptca 04/2003  . Cataract extraction     Bilateral  . Orif acetabular fracture 2009   No family history on file. History   Social History  . Marital Status: Widowed    Spouse Name: N/A    Number of Children: N/A  . Years of Education: N/A   Occupational History  . Retired, Engineer, manufacturing at [employer], [employer] - Manufacturing engineer    Social History Main Topics  . Smoking status: Never Smoker   . Smokeless tobacco: Not on file  . Alcohol Use: Not on file  . Drug Use: Not on file  .  Sexually Active: Not on file   Other Topics Concern  . Not on file   Social History Narrative   Married '36 - '43 Divorced: Married '46 - '82 widowed Lives alone, grandson lives w/her. I-ADLs, daughter is close at handEND of Life Care: DNR, DNI, No heroic or futile        Review of Systems  Constitutional: Positive for fatigue. Negative for fever, chills, diaphoresis, activity change, appetite change and unexpected weight change.  HENT: Positive for hearing loss, rhinorrhea and postnasal drip. Negative for ear pain, congestion, neck pain and tinnitus.   Eyes: Negative for photophobia, pain, discharge, redness and itching.       Reports loss of vision with increased difficulty with reading.  Respiratory: Positive for cough and shortness of breath. Negative for apnea, choking, chest tightness, wheezing and stridor.   Cardiovascular: Negative for chest pain, palpitations and leg swelling.  Gastrointestinal: Negative.   Genitourinary: Negative.   Musculoskeletal: Positive for back pain and arthralgias.  Neurological: Negative.  Negative for tremors, seizures, syncope and headaches.  Hematological: Negative.   Psychiatric/Behavioral: Negative for behavioral problems, confusion, dysphoric mood and agitation.       Objective:   Physical Exam  Vitals reviewed. Constitutional: She is oriented to person, place, and  time. She appears well-developed and well-nourished. No distress.       Elderly white woman who looks considerably younger than her 89 years.  HENT:  Head: Normocephalic and atraumatic.  Right Ear: Tympanic membrane and ear canal normal.  Left Ear: Tympanic membrane and ear canal normal.  Mouth/Throat: Uvula is midline, oropharynx is clear and moist and mucous membranes are normal. No oral lesions. No posterior oropharyngeal edema or posterior oropharyngeal erythema.  Eyes: Conjunctivae and EOM are normal. Left eye exhibits no discharge.  Neck: Neck supple.  Cardiovascular:         Irregularly irregular rte thatis rate controlled.  Pulmonary/Chest: Effort normal and breath sounds normal. No respiratory distress. She has no wheezes. She has no rales.  Abdominal: Soft.  Musculoskeletal: Normal range of motion. She exhibits no edema.  Neurological: She is alert and oriented to person, place, and time. She has normal reflexes.  Skin: No rash noted. No erythema.  Psychiatric: She has a normal mood and affect. Her behavior is normal. Thought content normal.   Lab Results  Component Value Date   WBC 7.5 03/30/2011   HGB 14.0 03/30/2011   HCT 42.0 03/30/2011   PLT 210.0 03/30/2011   CHOL 224* 02/04/2010   TRIG 144.0 02/04/2010   HDL 42.90 02/04/2010   LDLDIRECT 165.3 02/04/2010   ALT 13 03/30/2011   AST 18 03/30/2011   NA 136 03/30/2011   K 4.5 03/30/2011   CL 98 03/30/2011   CREATININE 0.9 03/30/2011   BUN 17 03/30/2011   CO2 32 03/30/2011   TSH 3.11 03/30/2011        BNP                       580        Assessment & Plan:  1. Blood pressure - poor control with SBP 180. Verified her home meds with her pharmacy: she has not been taking furosemide, she had not switched or added diltiazem and had not filled clonidine in several months. She has with her an old, inaccurate medication list from [facility].  Plan - due to heart rate will reduce atenolol to 50 mg daily           For BP control and a. Fib will start diltiazem 120mg  daily           For SBP control will start back furosemide           Will need follow-up visit in 1 month Addenedum - chemistry panel is OK with normal renal function.  2. Heart failure - she has a history of significant diastolic heart failure with hospitalization in the past for pulmonary edema.  Plan - med changes as above, especially restarting furosemide.           Lab - will check BNP, metabolic panel Addendum - BNP elevated - will do better with restart of furosemide  3. PAD - she is currently stable on plavix, aspirin and pletal with no complaint of  activity limiting claudication.  4. Rhinorrhea - no evidence of infection. Suspect this is vaso-motor rhinitis.   Plan - re- challenge with flonase nasal spray: 1 spray to each nostril AM and PM  5. Disequilibrium - a long standing problem. She actually seems to do quite well. She does use her rolling walker at all times.  6. Fatigue - actually she does well for a woman her age.  Plan - routine lab to check for any  possible underlying cause for her fatigue. Addendum - all labs are normal.  7. Lipids - for routine follow-up lab.  Addendum - cholesterol is mildly elevated and LDL is 165. Given her advanced age the benefit of statin medication is questionable and will be discussed at her next office visit.

## 2011-03-30 NOTE — Patient Instructions (Addendum)
Blood pressure is too high. I have reviewed your medications. Below is the list of what you should be taking for blood pressure and heart failure. Please note that I have reduced the strength of the atenolol and ordered the diltiazem. Heart does seem to be doing OK today. You should be taking the lasix (furosemide) every day to help manage heart failure and fluid accumulation.  Dizziness - no weakness on exam. There is not any medicine I would recommend at this time unless the dizziness got a lot worse. Be sure to use the walker at all times.   Allergic Rhinits - there is no evidence of infection. Furthermore, it is VERY unlikely that you would have an infection for this long. Plan - a rechallenge with fluticasone nasal spray: 1 spray to each nostril AM and PM to help control your symptoms.  Generalized weakness - your heart and lungs and strength all seem ok to day. We can check blood work to look for anemia or electrolyte imbalance or thyroid disease.

## 2011-04-26 ENCOUNTER — Other Ambulatory Visit: Payer: Self-pay | Admitting: Internal Medicine

## 2011-05-04 ENCOUNTER — Encounter: Payer: Self-pay | Admitting: Internal Medicine

## 2011-05-05 NOTE — Discharge Summary (Signed)
NAMESHAWNESE, [name]                 ACCOUNT NO.:  [id number]   MEDICAL RECORD NO.:  [id number]          PATIENT TYPE:  OBV   LOCATION:  6713                         FACILITY:  [facility]   PHYSICIAN:  Hind I Elsaid, MD      DATE OF BIRTH:  [birth year]   DATE OF ADMISSION:  03/20/2008  DATE OF DISCHARGE:  03/22/2008                               DISCHARGE SUMMARY   PRIMARY CARE PHYSICIAN:  Immunologist. Care, Dr. Lenon Curt. Neva Seat   DISCHARGE DIAGNOSES:  1. Rectal bleeding  possible haemorroidal bleeding   1. Anemia secondary to acute blood loss  did not require any blood      transfusion.  2. Left heel ulcer.  3. History of chronic esophagitis and a stricture.  4. History of coronary artery disease.  5. Hypertension.  Patient's blood pressure during hospitalization was      normal, did not require any antihypertensive medication.  6. History of diastolic congestive heart failure.  7. Gastroesophageal reflux syndrome.  8. Hyperlipidemia.  9. History of atrial fibrillation.  10.Chronic obstructive pulmonary disease.  11.History of coronary artery disease.  12.fibro muscular dysplasia of the renal artery.  13.History of right hip fracture status post hip arthroplasty.  14.History of diverticulosis.   CONSULTATIONS:  Gastroenterology consulted.   DISCHARGE MEDICATIONS:  1. Anusol Hydrocortisone per rectum q. h.s.  2. Protonix 40 mg p.o. daily.  3. Prednisone 10 mg p.o. daily.  4. Atenolol 100 mg p.o. daily.  5. Lasix 40 mg p.o. daily.  6. Zocor 40 mg p.o. daily.  7. Reglan 5 mg p.o. t.i.d. p.r.n.  8. Percocet 5/325 mg p.o. q. 4 hours p.r.n. for pain.  9. Aspirin 81 mg p.o. daily.  10.Plavix 75 mg p.o. daily.  11.Pyridium 200 mg p.o. daily.  12.Ferrous sulfate 325 mg p.o. daily.  13.Senna 2 tabs p.o. q.h.s. p.r.n. for constipation.   MEDICATIONS ON HOLD DURING THIS ADMISSION:  1. Lisinopril 40 mg p.o. daily.  2. We decreased the dose of Lasix to 40 mg p.o. daily.  3. Hold  Norvasc 2.5 mg p.o. daily.  4. We decreased the dose of aspirin to 81 mg p.o. daily.  5. We hold hydralazine 25 mg p.o. b.i.d.   HOSPITAL COURSE:  1. Please review the history done by Dr. Della Goo regarding      her admission.  She is a 77 year old female resident of [facility] who was sent to the emergency department after she had      several episode of rectal bleeding that stopped at this time.      Patient remained hemodynamically stable during hospitalization.      Gastroenterology were consulted where patient seen by Dr. Juanda Chance      from [facility] where they recommend Anusol      Hydrocortisone to the rectum and outpatient follow up for possible      anoscopy at office.  Accordingly, patient was started on the above      cream which remained stable.  Today, hemoglobin is 10.2.  Patient  did not require any blood transfusion.  We discussed with the      patient the situation if patient rebled significantly, patient may      need to be admitted as inpatient, otherwise follow CBC as      outpatient and follow with Dr. Juanda Chance at her office.  Ferrous      sulfate was prescribed to the patient.  2. Hypertension.  Blood pressure on admission was was 177/63.  During      hospitalization, blood pressure remained on the normal side without      any antihypertensive medications.  For that, we have to hold few of      her medication, mainly hydralazine and Norvasc.  We will leave the      decision to the primary care physician at the nursing home to      resume those medications if her blood pressure increased.  3. Left heel ulcer.  Patient did evaluate by wound care and recommend      Santyl for the affected heel and applying by 4 and to be changed      daily.   We felt that the patient is medically stable to be discharged to the  [facility] facility.  Patient to return if she have a _significant amout  of rectal bleeding requiring  hospitalization.  Also, patient was asked  to follow with Dr. Juanda Chance as outpatient.      Hind Bosie Helper, MD  Electronically Signed     HIE/MEDQ  D:  03/22/2008  T:  03/22/2008  Job:  [id number]

## 2011-05-05 NOTE — Op Note (Signed)
[name], [name]                 ACCOUNT NO.:  [id number]   MEDICAL RECORD NO.:  [id number]          PATIENT TYPE:  INP   LOCATION:  2917                         FACILITY:  [facility]   PHYSICIAN:  Burnard Bunting, M.D.    DATE OF BIRTH:  02-01-1915   DATE OF PROCEDURE:  02/17/2008  DATE OF DISCHARGE:                               OPERATIVE REPORT   PREOP DIAGNOSIS:  Right hip high intertrochanteric, low femoral neck  fracture.   PROCEDURE:  Right hip hemiarthroplasty.   SURGEON/ATTENDING:  Dorene Grebe, MD.   ASSISTANT:  Allie Bossier, MD.   ANESTHESIA:  General endotracheal.   ESTIMATED BLOOD LOSS:  150.   INDICATIONS:  [name] is a 76 year old female who is ambulatory,  fell several days ago.  After medical management, she presents for  operative fixation of her hip fracture.   PROCEDURE IN DETAIL:  The patient was brought to the operating room.  Initially she was placed on the fracture table and an attempted fracture  reduction for IMH was made under fluoroscopic guidance.  The hip could  not be reduced well enough for IMHS placement.  For that reason, she was  changed tables, placed in the lateral position.  Right hip was then  prepped with DuraPrep solution __________ and draped in a sterile  manner.  [name] was used to cover the operative field.  A posterior  approach to the hip was utilized.  The skin and subcutaneous tissue  sharply divided, fascia lata was divided, sciatic nerve was palpated.  A  Charnley retractor was placed.  The sciatic nerve was protected at all  times during the remaining portion of the case.  The piriformis tendon  was detached, external rotators were detached, capsule was split in a T-  shaped fashion and tagged with #1 Ethibond suture.  The head and neck  was removed.  Broaching was then performed with a trial 5 broach in  position.  A high offset +10 head, measuring size 49, was placed.  The  patient had excellent stability of the hip in full  extension, external  rotation __________ as well as 90 degrees of hip flexion, 20 degrees of  adduction and 70 degrees of internal rotation.  At this time the trial  components were removed, incision was irrigated.  True components were  placed, excellent Press-Fit was obtained.  Incision was then irrigated,  capsule and external rotators were closed.  The fascia lata was closed  over a drain, sciatic nerve was palpated and found to be intact.  Skin  was closed using interrupted inverted #0 Vicryl suture, #2-0 Vicryl  suture and skin staples.  A Mepilex dressing was placed.  Leg lengths  were approximately 8+ at the conclusion of the case.  Dr. Eliberto Ivory  assistance was medical necessity for the entire case for retraction  __________ neurovascular structures and limb positioning.  A knee  immobilizer was placed.     Burnard Bunting, M.D.  Electronically Signed    GSD/MEDQ  D:  02/17/2008  T:  02/19/2008  Job:  [id number]

## 2011-05-05 NOTE — H&P (Signed)
[name], [name]                 ACCOUNT NO.:  [id number]   MEDICAL RECORD NO.:  [id number]          PATIENT TYPE:  OBV   LOCATION:  6713                         FACILITY:  [facility]   PHYSICIAN:  Della Goo, M.D. DATE OF BIRTH:  1915/12/17   DATE OF ADMISSION:  03/20/2008  DATE OF DISCHARGE:                              HISTORY & PHYSICAL   PRIMARY CARE PHYSICIAN:  [employer], Lenon Curt. Chilton Si, M.D.   CHIEF COMPLAINT:  Rectal bleeding.   HISTORY OF PRESENT ILLNESS:  This is a 77 year old female who is a  resident at [facility] who was sent to the emergency  department after she had several episodes of rectal bleeding that  started this a.m.  The patient reports having episodes off-and-on;  however, it recurred today and seems to be a lot of blood in her  opinion.  She denies having any weakness, dizziness, chest pain, or  shortness of breath at this time.  The patient denies having any nausea  or vomiting prior to her arrival in the emergency department.  The  patient was evaluated by the emergency department physician, and a  rectal exam was also performed.  Brownish loose stool was found to be  trace Hemoccult-positive, and her laboratory studies on initial  admission revealed a hemoglobin level of 13.   The patient had stable vital signs; and at the time of my evaluation of  this patient, she also was hemodynamically stable, and had no further  episodes of rectal bleeding.  However, following my evaluation, the  patient did develop another episode of rectal bleeding and had passage  of a large amount of bloody heme-positive stool.  Her vital signs still  remained stable.  The patient also had an episode of vomiting.  She did  complain of having abdominal pain, discomfort; and reported, prior to  this, having diarrhea.   PAST MEDICAL HISTORY SIGNIFICANT FOR:  1. Coronary artery disease.  2. Hypertension.  3. History of diastolic  dysfunction/congestive heart failure syndrome.  4. Gastroesophageal reflux disease.  5. Hyperlipidemia.  6. Atrial fibrillation.  7. Chronic obstructive pulmonary disease.  8. Carotid artery disease.  9. 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  10.The patient also suffered a right hip fracture and underwent right      hip hemiarthroplasty February 17, 2008.   MEDICATIONS INCLUDE:  1. Lisinopril 40 mg one p.o. daily.  2. Atenolol 100 mg one p.o. daily.  3. Lasix 40 mg one p.o. b.i.d.  4. Norvasc 2.5 mg one p.o. daily.  5. Plavix 75 mg one p.o. daily.  6. Apresoline 25 mg one p.o. b.i.d.  7. Prednisone 10 mg one p.o. daily.  8. Prilosec 20 mg one p.o. daily.  9. Peridium 200 mg one p.o. daily.  10.Simvastatin 40 mg one p.o. daily.  11.Reglan 5 mg one p.o. t.i.d. p.r.n.  12.Aspirin 81 mg 2 tablets p.o. daily.  13.Hydralazine 25 mg one p.o. b.i.d.  14.Percocet 5/325 mg one p.o. q.4 h p.r.n. pain.   ALLERGIES:  CODEINE.  SOCIAL HISTORY:  Nonsmoker, nondrinker.   FAMILY HISTORY:  Unable to obtain.   REVIEW OF SYSTEMS:  Pertinents as mentioned above.   PHYSICAL EXAMINATION FINDINGS:  GENERAL:  A pleasant 77 year old female,  well-nourished, well-developed in discomfort, but no acute distress.  VITAL SIGNS:  Reveal a temperature of 98.3, blood pressure 172/63, heart  rate 97, respirations 18, O2 saturation 99%.  HEENT: Examination normocephalic, atraumatic.  Pupils reactive to light  bilaterally.  Oropharynx is clear.  NECK:  Supple full range of motion.  No thyromegaly, adenopathy, or  jugulovenous distention.  CARDIOVASCULAR:  Regular rate and rhythm.  No  murmurs, gallops, or rubs.  LUNGS: Clear to auscultation bilaterally.  ABDOMEN:  Positive bowel sounds, soft, mildly tender in the lower  quadrants.  No rebound, no guarding.  EXTREMITIES: Without cyanosis, clubbing or edema.  NEUROLOGIC EXAMINATION:  Generalized weakness, but  grossly nonfocal.   LABORATORY STUDIES:  White blood cell count 16.6, hemoglobin 13.2,  hematocrit 40.2, platelets 269, neutrophils 91% MCV 85.8.  Sodium 132,  potassium 5.1, chloride 96, carbon dioxide 29, BUN 28, creatinine 1.25,  and glucose 178.   ASSESSMENT:  A 77 year old female being admitted with  1. Rectal bleeding.  2. Abdominal pain.  3. Leukocytosis.  4. Uncontrolled hypertension.   PLAN:  The patient will be admitted to a telemetry area for cardiac  monitoring.  Serial hemoglobin checks will be ordered q.8 h. for 48  hours and 2 units of packed red blood cells have been placed on hold for  possible transfusion if needed.  The patient will be placed empirically  on IV antibiotic therapy of Cipro and Flagyl.  The patient does have a  history of diverticulitis.  Imaging studies will also be ordered, a 3-  way abdomen has been ordered initially; pending results of this, a CT  scan will possibly be ordered as well.  The patient has been placed on  clear liquids, for  now, and her aspirin therapy has been discontinued.  The patient's  regular medications will be further verified.  The sheets from the  nursing home facility were not clear, and they will be requested again.  GI prophylaxis has been ordered, and the patient has been ordered TED  hose for DVT prophylaxis.      Della Goo, M.D.  Electronically Signed     HJ/MEDQ  D:  03/21/2008  T:  03/21/2008  Job:  [id number]   cc:   Lenon Curt. Chilton Si, M.D.

## 2011-05-05 NOTE — Consult Note (Signed)
NAMEEVETT, KASSA                 ACCOUNT NO.:  [id number]   MEDICAL RECORD NO.:  [id number]          PATIENT TYPE:  INP   LOCATION:  2917                         FACILITY:  [facility]   PHYSICIAN:  Jonelle Sidle, MD DATE OF BIRTH:  04/12/1915   DATE OF CONSULTATION:  02/16/2008  DATE OF DISCHARGE:                                 CONSULTATION   PRIMARY CARE PHYSICIAN:  Rosalyn Gess. Norins, M.D.   PRIMARY CARDIOLOGIST:  Everardo Beals. Juanda Chance, MD, [facility]   ORTHOPEDIC SURGEON:  Burnard Bunting, M.D.   REASON FOR CONSULTATION:  Preoperative cardiac evaluation.   HISTORY OF PRESENT ILLNESS:  Ms. Meares is a pleasant 77 year old woman  with a known history of coronary artery disease last seen by Dr. Juanda Chance  in the office back in 2005.  She presented in 2004 with atrial  fibrillation associated with abnormal troponin I levels and underwent  cardiac catheterization which revealed nonobstructive left system  disease with 80% stenosis in the mid right coronary artery, 90% stenosis  in the distal right coronary, and 90% stenosis in the posterior lateral  branch of the right coronary artery.  A she had normal left systolic  function at that time and underwent treatment with drug-eluting stents  to address the right-sided stenoses with cutting balloon angioplasty of  the posterolateral branch.  She tolerated this well and has been managed  medically.  She had a Myoview done in the office in 2005 which  demonstrated no ischemia.  Other medical problems include hypertension,  hyperlipidemia, carotid disease (details not certain), gastroesophageal  reflux disease, chronic obstructive pulmonary disease and previous  trochanteric fracture on the right.  She is now admitted to the  hospital, having presented following a fall without loss of  consciousness or frank syncope.  She states that she heard a pop when  she was emptying her garbage.  She felt unstable on her feet and  ultimately fell down the stairs  on her right side.  She was noted to  have significant hypertension/hypertensive urgency in the setting of  pain and remains hypertensive at this time.  She is actually already  scheduled for hip replacement in approximately 30-45 minutes at the time  of our consultation, and we were asked to see urgently for a  preoperative assessment.  In reviewing the available data, I note that  her cardiac markers have trended abnormally with a troponin I increasing  from less than 0.05 up to 0.41 associated with normal total CK level and  a CK-MB of 6.3.  The patient's electrocardiogram at baseline shows left  ventricular hypertrophy with some QRS widening, although most recently  looks more consistent with a left bundle branch block pattern which I  presume is new.  She is denying any chest pain or breathlessness and has  had no problems with exertional symptoms leading up to her recent fall.  In addition to this, she had a chest x-ray done showing evidence of  pulmonary edema which looks to be increased in comparison to her prior  study.  She is being treated with diuretics  at this time, and although  complaining of no active chest pain or breathlessness, is having a  significant amount of hip pain.  Her BNP level is elevated to the 300  range.  Otherwise, BUN and creatinine of 21 and 1.1 respectively, and  her white blood cell count is 13 to 17.  I note that she is on chronic  steroids at home (prednisone 10 mg daily).   ALLERGIES:  CODEINE.   MEDICATIONS AT HOME:  Multiple and include:  1. Plavix 75 mg p.o. daily.  2. Aspirin 325 mg p.o. daily.  3. Atenolol 100 mg p.o. daily.  4. Pyridium 200 mg p.o. daily.  5. Lasix 40 mg p.o. daily.  6. Prilosec 20 mg p.o. daily.  7. Chlordiazepoxide 10 mg p.o. daily.  8. Reglan 5 mg p.o. t.i.d. p.r.n.  9. Prednisone 10 mg p.o. daily.  10.Meclizine 12.5 mg p.o. daily.  11.Lisinopril 30 mg p.o. daily.  12.Simvastatin 40 mg p.o. daily.  13.Benzonatate  100 mg p.o. daily.  14.Macrobid 100 mg p.o. daily.  She has also received:  1. Protonix.  2. Dilaudid.  3. Intravenous Lasix.  4. Has been recently started on p.r.n. intravenous Lopressor.   PAST MEDICAL HISTORY:  As detailed above.  Also history of right-sided  fibromuscular dysplasia of the renal artery.  She has had relatively  stable creatinine and reports reasonable blood pressure control.  No  active tobacco or alcohol use.   FAMILY HISTORY:  Reviewed and noncontributory.   REVIEW OF SYSTEMS:  The patient complains predominantly of hip  discomfort at this time.  No resting chest pain or shortness of breath.  She denies any antecedent exertional chest pain or limiting dyspnea  beyond NYHA class II.  No obvious palpitations or syncope.   PHYSICAL EXAMINATION:  VITAL SIGNS:  The patient's temperature is 97.4  degrees.  Most recent blood pressure 182/56, heart rate 75.  Oxygen  saturation 95% on 2 liters nasal cannula, respirations 24.  GENERAL:  This is an elderly woman appearing somewhat younger than her  stated age.  No acute distress, although uncomfortable with hip pain.  HEENT:  Conjunctivae are normal.  Pharynx is clear.  NECK:  Neck is supple.  No elevated jugular venous pressure, no obvious  bruits.  LUNGS:  Somewhat diminished breath sounds.  No active wheezing.  Rhonchorous towards the bases.  CARDIOVASCULAR:  A regular rate and rhythm with a 2/6 systolic murmur  heard best at the right base.  The second heart sound is preserved.  No  S3 gallop or pericardial rub.  ABDOMEN:  Soft, nontender.  Normoactive bowel sounds.  No obvious  hepatomegaly.  EXTREMITIES:  No frank pitting edema.  Distal pulses 1 to 2+.  SKIN:  Warm and dry.  MUSCULOSKELETAL:  No frank kyphosis noted.  NEUROPSYCHIATRIC:  The patient is alert and oriented x3.   LABORATORY DATA:  WBC of 16.9, hemoglobin 15.3, hematocrit 45.8,  platelets recently clumped, although previously 252.  INR is 1.1.   Sodium 136, potassium 4.2, chloride 99, bicarbonate 26, glucose 165, BUN  21, creatinine 1.1.  GFR calculated at 44.   IMPRESSION:  1. Preoperative cardiac assessment in a 77 year old woman with known      coronary artery disease status post previous coronary intervention      including drug-eluting stents to the right coronary artery and      cutting balloon angioplasty of the posterior descending branch back      in 2004.  The patient had a follow-up Myoview in 2005 without      ischemia and previously documented normal left ventricular systolic      function.  She has not been frankly symptomatic.  Denied any      exertional chest pain or limiting shortness of breath.  She now      presents following a fall with subsequent hip fracture and      concurrent evidence of hypertensive urgency associated with      pulmonary edema on chest x-ray.  Her electrocardiogram also shows      left renal hypertrophy with QRS widening and frankly a left bundle      branch block pattern which may well be new.  She is not having any      chest pain at this time.  Her troponin I levels are mildly      increased.  2. Longstanding history of hypertension, at home on a beta blocker and      ACE inhibitor.  3. History of chronic obstructive pulmonary disease, on longstanding      steroids, presently prednisone 10 mg daily.  4. Hyperlipidemia, on simvastatin.   Would cancel surgery today until we can get a better handle on the  patient clinically.  Would suggest converting her antihypertensive  regimen to intravenous medications, specifically Lopressor 5 mg IV q.6h.  and enalapril at 1.25 mg IV q.6h. P.R.N. hydralazine can also be used to  control systolic blood pressures greater than 180.  Agree with gentle  diuresis.  I suspect that her degree of edema may well be related to  diastolic dysfunction in the setting of significant hypertension rather  than decompensated systolic heart failure.  I would,  however, suggest a  follow-up echocardiogram today to clearly document her left ventricular  function particular in light of her electrocardiographic changes.  Serial cardiac markers will also be pursued, although she will not be  fully anticoagulated at this time.  My hope is that she can be  stabilized medically with better blood pressure and volume control and  perhaps be taken to the OR tomorrow.  I expect that her perioperative  cardiac complication risk will at least be moderate at baseline.  We  will convert her steroids to intravenous dosing, as well, to avoid  problems with adrenal insufficiency.  We will plan to follow with you.      Jonelle Sidle, MD  Electronically Signed     SGM/MEDQ  D:  02/16/2008  T:  02/16/2008  Job:  [id number]   cc:   Rosalyn Gess. Norins, MD  Everardo Beals. Juanda Chance, MD, [facility]  G. Dorene Grebe, M.D.

## 2011-05-05 NOTE — Discharge Summary (Signed)
[name], [name]                 ACCOUNT NO.:  [id number]   MEDICAL RECORD NO.:  [id number]          PATIENT TYPE:  INP   LOCATION:  6529                         FACILITY:  [facility]   PHYSICIAN:  Valerie A. Felicity Coyer, MDDATE OF BIRTH:  [birth year]   DATE OF ADMISSION:  02/15/2008  DATE OF DISCHARGE:                               DISCHARGE SUMMARY   DISCHARGE DIAGNOSES:  1. Right hip fracture status post hemi-arthroplasty.  2. Coronary artery disease with non-ST segment elevation myocardial      infarction during this hospitalization.  3. Acute on chronic diastolic heart failure.  4. Uncontrolled hypertension.   HISTORY OF PRESENT ILLNESS:  [name] is a 77 year old white female  with past medical history of COPD, asthma and CAD who fell approximately  14 months ago suffering a right greater trochanteric fracture.  On the  day of admission, the patient was emptying her garbage when she heard a  sudden pop in her hip, at which time she began to feel very unstable and  fell while walking down the stairs, landing on her right side.  Upon  evaluation in the ER, the patient's systolic blood pressure was found to  be greater than 300.  She was placed on IV nitroglycerin drip by  emergency room physician.  Chest x-ray done in the emergency room showed  signs of acute congestive heart failure, for which she was placed on IV  diuresis.  In addition, the patient was placed on BiPAP for arterial  blood gas showing hypoxia and increased somnolence in the patient.  The  patient had already been weaned off BiPAP prior to being transferred to  the floor.  The patient was admitted via service at that time with  orthopedic consultation in addition to cardiology consultation for preop  clearance.  Surgery was held on day of admission secondary to increased  troponin levels and the patient's known history of CAD.   PAST MEDICAL HISTORY:  1. CAD.  2. Atrial fibrillation.  3. Hypertension.  4.  Hyperlipidemia.  5. Carotid artery disease.  6. Gastroesophageal reflux disease.  7. Chronic obstructive pulmonary disease.  8. 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   HOSPITAL COURSE:  1. CAD with non-ST EMI this hospitalization.  As noted above,      cardiology was called to consult for preop clearance.  Upon      evaluation, the patient noted to have increased troponin's.  EKG      showed a left ventricular hypertrophy with some QRS widening, as      well as questionable left bundle branch block pattern, which was      seemingly new.  The patient denied any chest pain or shortness of      breath.  She had no problems with exertional symptoms prior to her      fall.  Chest x-ray showed evidence of pulmonary edema, seemingly      increased since her prior study, for which she was being treated      with IV diuresis.  A 2D echocardiogram obtained during this  hospitalization revealed an ejection fraction of 55-65%.  The      patient continued on med management and was instructed by      cardiology to continue aspirin, Plavix, beta blocker, ACE inhibitor      and statin.  She was felt stable for surgery with Dr. August Saucer on      February 17, 2008.  2. Right hip fracture.  Again, the patient underwent right hip hemi-      arthroplasty by Dr. August Saucer on February 17, 2008.  The patient is      planned to be discharged to a skilled nursing facility for      continued rehab then physical therapy.  She is instructed to follow      up in 7 days in orthopedic clinic with Dr. August Saucer.  3. Uncontrolled hypertension.  As mentioned above in HPI, the      patient's systolic blood pressure noted to be greater than 300 on      admission in the ER, at which time she was placed on a      nitroglycerin drip, discontinued prior to arrival at floor.  The      patient was managed on p.r.n. IV Lopressor and has been      transitioned to home meds at increased dose.  Her blood pressure      has  remained stable greater than 48 hours.  She is felt stable for      discharge to skilled nursing facility with medications as      instructed.  4. Acute on chronic diastolic heart failure.  As mentioned above,      pulmonary edema noted on chest x-ray done in the ER.  The patient      was IV diuresed.  A 2D echocardiogram was performed, which revealed      a normal left ventricular ejection fraction.  The patient euvolemic      on exam at the time of discharge.  She is to resume Lasix as      ordered.  Note, this is an increased dose from prior to arrival.   DISCHARGE MEDICATIONS:  1. Aspirin 162 mg p.o. daily.  2. Atenolol 100 mg p.o. daily.  3. Lisinopril 40 mg p.o. daily.  4. Lasix 40 mg p.o. b.i.d.  5. Norvasc 2.5 mg p.o. daily.  6. Plavix 75 mg p.o. daily.  7. Apresoline 25 mg p.o. b.i.d.  8. Prednisone 10 mg p.o. daily.  9. Prilosec 20 mg p.o. daily.  10.Pyridium 200 mg p.o. daily.  11.Simvastatin 40 mg p.o. daily.  12.Reglan 5 mg p.o. t.i.d. p.r.n.  13.Percocet 5/325 p.o. q. 4 hours p.r.n. for pain.   PERTINENT LAB WORK AT TIME OF DISCHARGE:  Sodium 141, potassium 4.2, BUN  33, creatinine 0.88.  White blood cell count 9.6, platelet count 195,  hemoglobin 11.2, magnesium 2.1, phosphorus 3,   PHYSICAL EXAMINATION:  VITAL SIGNS:  At time of discharge:  Blood  pressure 125/27, heart rate 62, respirations 14, temperature 97.8,  oxygen saturation 95% on 1 L.  GENERAL:  This is an elderly white female, alert and oriented, no  apparent distress.  HEAD/EYES/EARS/NOSE/THROAT:  Normocephalic, atraumatic.  Mucous  membranes dry.  Pupils equal, round and reactive to light with no  scleral icterus.  NECK:  Supple without any thyromegaly or lymphadenopathy.  No JVD or  carotid bruits.  CHEST:  With symmetrical movement.  Decreased respiratory effort;  however, clear to auscultation bilaterally with no wheezes,  rales or  crackles.  HEART:  Regular rate and rhythm with grade 2/6  systolic murmur.  ABDOMEN:  Soft, nontender, nondistended with positive bowel sounds.  EXTREMITIES:  Without any swelling or edema bilaterally to the lower  extremities.  NEUROLOGIC:  The patient without any motor, sensory or focal deficits on  exam.  SKIN:  Without any suspicious lesions or rashes.   DISPOSITION:  The patient is felt medically stable for discharge to  skilled nursing facility for continued physical therapy and rehab.  She  is to follow up with Dr. August Saucer in 7 days.  Please call to schedule an  appointment at [phone].  She is to be weightbearing as tolerated and,  per ortho, it is okay to discontinue knee immobilizer when working with  physical therapy.      Cordelia Pen, NP      Raenette Rover. Felicity Coyer, MD  Electronically Signed    LE/MEDQ  D:  02/22/2008  T:  02/22/2008  Job:  [id number]   cc:   Rosalyn Gess. Norins, MD

## 2011-05-05 NOTE — Consult Note (Signed)
[name], [name]                 ACCOUNT NO.:  [id number]   MEDICAL RECORD NO.:  [id number]          PATIENT TYPE:  INP   LOCATION:  2917                         FACILITY:  [facility]   PHYSICIAN:  Burnard Bunting, M.D.    DATE OF BIRTH:  [birth year]   DATE OF CONSULTATION:  02/16/2008  DATE OF DISCHARGE:                                 CONSULTATION   REQUESTING PHYSICIAN:  Dr. Rito Ehrlich   CHIEF COMPLAINT:  Hip pain.   HISTORY OF PRESENT ILLNESS:  [name] is a 77 year old female, who is  ambulatory and lives with her son at home, who fell today.  There is no  loss of consciousness.  She actually tried to get back to the garage  with the walker when she was unable to put weight on the right leg.  She  denies any other orthopedic complaints, denies any neck pain or upper  extremity symptoms.   PAST MEDICAL HISTORY:  Notable for hypertension, diverticulosis and CAD  with MI.  She has had stent placement times 2.  She denies any other  surgical procedures.   SHE IS ALLERGIC TO CODEINE.   CURRENT MEDICATIONS:  Atenolol, Advair Diskus, pyridine, Lasix,  Prilosec, Chlordiazepoxide, Reglan, __________, prednisone, meclizine,  lisinopril, Simvastatin, Plavix, benzonatate, enteric-coated aspirin,  Macrobid.   REVIEW OF SYSTEMS:  Unremarkable.  She actually had a rocky ER course  with congestive heart failure and hypertensive crisis due possibly to  fentanyl administration.  That course stabilized in the emergency room.   On her current examination, her blood pressure is 129/53, pulse is 68,  respirations 27, temperature is 99.  She is on 97% on 3 liters of O2.  She is in mild distress.  She is alert and oriented x3.  She has good  cervical spine range of motion, good motion on range of motion of her  upper extremities, palpable radial pulses bilaterally.  No bruising or  ecchymoses noted in the skin.  No cervical lymphadenopathy.  Chest is clear to auscultation.  Heart is beating  regular rate and rhythm.  Abdominal exam is benign.  She has on her lower extremities shortening external rotations of the  right leg.  She has intact sensation on the dorsal aspect of the foot  with intact dorsal/plantar flexion observed bilaterally.  No knee  effusion, ankle effusion or hip pain with internal and external rotation  on the left.  On the right hand side there is no knee effusion, no real  ecchymosis is noted.   LABORATORY VALUES:  Include a creatinine of 1.0, sodium 135, potassium  3.8, BUN 19, troponin is less than 0.5.  White count is 13,000,  hematocrit is 47, platelet count is 252, INR is 1.1.  Head CT shows no  acute intracranial abnormality.  She has an intertrochanteric hip  fracture on the right.  Chest x-ray is pending.   IMPRESSION:  Right intertrochanteric hip fracture, pneumatically  unstable.   PLAN:  Medical services is going to admit the patient and stabilize her.  She is going to undergo open reduction internal fixation of  her  intertrochanteric hip fracture.  The risks and benefits were discussed  with the patient and her son including but not limited to infection,  nonunion, malunion, need for more surgery, deep venous thrombosis,  death.  All questions were answered.   ADDENDUM:  Chest x-ray did show acute CHF which has been improved  clinically by ER intervention.      Burnard Bunting, M.D.  Electronically Signed     GSD/MEDQ  D:  02/15/2008  T:  02/16/2008  Job:  [id number]

## 2011-05-05 NOTE — H&P (Signed)
NAMEDESTRY, BEZDEK                 ACCOUNT NO.:  [id number]   MEDICAL RECORD NO.:  [id number]          PATIENT TYPE:  EMS   LOCATION:  MAJO                         FACILITY:  [facility]   PHYSICIAN:  Hollice Espy, M.D.DATE OF BIRTH:  [birth year]   DATE OF ADMISSION:  02/15/2008  DATE OF DISCHARGE:                              HISTORY & PHYSICAL   PRIMARY CARE PHYSICIAN:  Rosalyn Gess. Norins, M.D.   CONSULTATIONSBurnard Bunting, M.D., orthopedic surgery.   CHIEF COMPLAINT:  Fall.   HISTORY OF PRESENT ILLNESS:  The patient is a 77 year old white female  with past medical history of COPD, asthma, and CAD, who approximately 14  months ago suffered a fall leading to a greater trochanteric fracture on  the right side.  Since that time, she lives at home with the family.  Today she said that she was tending to emptying her garbage when all of  a sudden she heard a pop.  She felt very unstable and when she tried to  walk down the stairs she fell landing on her right side.  Paramedics  were called and the patient was suspected to have a hip fracture.  En  route, we do not have the EMS report, but according to ER records, the  patient was given 100 mg of Fentanyl patch and all of a sudden she  actually became severe agitated and her blood pressure went up to  greater than 300 by the time she came into the emergency room.  The  patient was started on a nitroglycerin drip.  She also became much more  somnolent.  Her blood pressure actually responded dramatically to the  nitroglycerin drip and went down actually into the 90's.  The drip was  weaned off and her pressure slowly started to come back into the 150's.  In regards to her somnolence, a blood gas was checked and she was found  to be hypoxic with a pCO2 of 75, a pO2 of 80 and this was on 15 liters.  Chest x-ray showed signs of acute congestive heart failure.  The patient  was given IV 80 mg Lasix and put on BiPAP.  Her pCO2 came down into  the  high 50's.  Her oxygenation improved and she started to diurese well.  She was also given medication for pain.  Currently following all of  these episodes, she is actually becoming much more stable.  She was able  to be weaned off the BiPAP and currently on 3 liters of oxygen.  She is  currently pain free, complaining of just some very mild soreness.  She  denies chest pain and currently otherwise doing well.  She is extremely  tired.  She complains of soreness in the right hip, but denies any  vision changes.  No dysphagia.  No chest pain.  She denies any chest  pain or palpitations.  She says her breathing is better.  She denies any  coughing or wheezing.  She denies any abdominal pain.   REVIEW OF SYSTEMS:  Otherwise negative.   PAST MEDICAL HISTORY:  Trochanteric fracture on the right, hypertension,  GERD, asthma, COPD, CAD.   MEDICATIONS:  We do not have an up-to-date medication list, but based on  previous records, the patient is on:  1. Atenolol 100 mg.  2. Advair.  3. Peridium.  4. Motrin p.r.n.  5. Lasix 40 mg.  6. Prilosec 20 mg.  7. Librium 10 mg.  8. Phenergan 25 mg.  9. Reglan 5 mg.  10.Benzopyridine 200 mg.  11.Prednisone 10 mg.  12.Meclizine 12.5 mg.  13.Lisinopril 30 mg.  14.Simvastatin 40 mg.  15.Plavix 75 mg.  16.Aspirin 325 mg.  17.Macrobid 100 mg.   ALLERGIES:  CODEINE.   SOCIAL HISTORY:  No tobacco, alcohol, or drug use.   FAMILY HISTORY:  Noncontributory.   PHYSICAL EXAMINATION:  VITAL SIGNS:  Temperature 98.6, heart rate 85  dropped recently down to 51 and now up to 77, blood pressure 300/120  down then to as low as 90's, now up to 161/63.  Respirations were  initially 40 and now down to 20 on 3 liters.  O2 saturations currently  96% on 4 liters.  GENERAL:  She is alert and oriented x3.  She is quite lucid.  HEENT:  Normocephalic and atraumatic.  Mucous membranes are dry.  She  has no carotid bruits.  HEART:  Currently in irregular  rhythm, looks to be a paced rhythm by  monitor.  LUNGS:  Decreased breath sounds throughout, more so at the bases.  ABDOMEN:  Soft, obese, and nontender.  Positive bowel sounds.  EXTREMITIES:  No clubbing or cyanosis.  Trace pitting edema.  MUSCULOSKELETAL:  Deferred secondary to her injury.   LABORATORY DATA:  Chest x-ray shows acute CHF.  Right hip shows  intertrochanteric right hip fracture.  CT of the head is unremarkable.   White count 13, H&H 15.6 and 48, MCV 86, platelet count 252, 79% shift.  Coags unremarkable.  BNP 324.  Sodium 135, potassium 3.8, chloride 104,  bicarb 23, BUN 19, creatinine 1, glucose 301, pH 7.23, pCO2 61, pO2 85,  bicarb 26.  This was prior to BiPAP.  Now she is down to pH 7.3, pCO2  50, pO2 94, bicarb 24, and this is on 15 liters and she is now weaned  down to 4.  CPK 65.9, MB 1.8, troponin I less than 0.05.   Her EKG shows a normal sinus rhythm, possibly some inferior lead mild ST  depression.   ASSESSMENT:  1. Fall with new hip trochanteric fracture.  Orthopedics are      following. OR when stable, possibly tomorrow night.  2. Malignant hypertensive urgency, systolic greater than 300 secondary      to pain, was on nitroglycerin drip and dropped to the 90's, now off      of the drip.  3. Congestive heart failure acute, diuresing.  Recheck chest x-ray in      the morning.  4. Hypoxia with CO2 retention and history of chronic obstructive      pulmonary disease.  I suspect this is likely      secondary to Fentanyl and congestive heart failure.  Now she is off      BiPAP and she is a DO NOT RESUSCITATE.  5. Elevated CBG's, follow sliding scale.      Hollice Espy, M.D.  Electronically Signed     SKK/MEDQ  D:  02/15/2008  T:  02/15/2008  Job:  [id number]   cc:   Rosalyn Gess. Norins, MD  G. Dorene Grebe,  M.D. 

## 2011-05-06 ENCOUNTER — Encounter: Payer: Self-pay | Admitting: Internal Medicine

## 2011-05-06 ENCOUNTER — Ambulatory Visit (INDEPENDENT_AMBULATORY_CARE_PROVIDER_SITE_OTHER): Payer: Medicare Other | Admitting: Internal Medicine

## 2011-05-06 DIAGNOSIS — R5381 Other malaise: Secondary | ICD-10-CM

## 2011-05-06 DIAGNOSIS — R5383 Other fatigue: Secondary | ICD-10-CM

## 2011-05-06 DIAGNOSIS — I10 Essential (primary) hypertension: Secondary | ICD-10-CM

## 2011-05-06 DIAGNOSIS — I509 Heart failure, unspecified: Secondary | ICD-10-CM

## 2011-05-06 MED ORDER — FUROSEMIDE 40 MG PO TABS: 40.0000 mg | ORAL_TABLET | Freq: Every day | ORAL | Status: DC

## 2011-05-06 MED ORDER — ATENOLOL 100 MG PO TABS: 100.0000 mg | ORAL_TABLET | Freq: Every day | ORAL | Status: DC

## 2011-05-06 MED ORDER — BENZONATATE 100 MG PO CAPS
100.0000 mg | ORAL_CAPSULE | Freq: Three times a day (TID) | ORAL | Status: DC | PRN
Start: 2011-05-06 — End: 2012-03-10

## 2011-05-06 NOTE — Patient Instructions (Addendum)
Thanks for coming to see me today.  Blood pressure - the top number is too high. Plan - continue all your present medications but I have increased your dose of furosemide (lasix) to 40mg  once a day for better control of blood pressure. The increased dose should not cause any increase in urinary frequency.   We checked you labs April 9th and it was all ok.   For the cough we can try a tessalon perle to take every 8 hours as needed for cough. You can also take Robitussin DM 1 tsp every 6 hours as needed.

## 2011-05-06 NOTE — Progress Notes (Signed)
Subjective:    Patient ID: [name], female    DOB: [birth year], 77 y.o.   MRN: [id number]  HPI [name] presents for follow-up of blood pressure and general medical care. She is having problems with balance and she will feel dizzy. She is using a rolling walker all the time. She has otherwise been doing OK. She does c/o low energy. She is loosing vision right eye.  Past Medical History  Diagnosis Date  . CAD (coronary artery disease)   . Hyperlipidemia   . Osteoporosis   . PVD (peripheral vascular disease)   . HTN (hypertension)   . Diverticulosis of colon   . GERD (gastroesophageal reflux disease)   . Hip fracture, right   . Decubitus ulcer     Right heel  . COPD (chronic obstructive pulmonary disease)   . Anxiety   . Anemia   . Esophageal stricture   . CHF (congestive heart failure)   . Atrial fibrillation   . Fibromuscular dysplasia     Renal artery   Past Surgical History  Procedure Date  . Ptca 04/2003  . Cataract extraction     Bilateral  . Orif acetabular fracture 2009   No family history on file. History   Social History  . Marital Status: Widowed    Spouse Name: N/A    Number of Children: N/A  . Years of Education: N/A   Occupational History  . Retired, Engineer, manufacturing at [employer], [employer] - Manufacturing engineer    Social History Main Topics  . Smoking status: Never Smoker   . Smokeless tobacco: Never Used  . Alcohol Use: No  . Drug Use: No  . Sexually Active: Not Currently   Other Topics Concern  . Not on file   Social History Narrative   Married '36 - '43 Divorced: Married '46 - '82 widowed . Lives alone, grandson lives w/her. I-ADLs, daughter is close at hand.  END of Life Care: DNR, DNI, No heroic or futile      Review of Systems Review of Systems  Constitutional:  Negative for fever, chills, activity change and unexpected weight change.  HENT:  Negative for hearing loss, ear pain, congestion, neck stiffness and postnasal drip.   Eyes:  Negative for pain, discharge and visual disturbance.  Respiratory: Negative for chest tightness and wheezing.   Cardiovascular: Negative for chest pain and palpitations.       [No decreased exercise tolerance Gastrointestinal: [No change in bowel habit. No bloating or gas. No reflux or indigestion Genitourinary: Negative for urgency, frequency, flank pain and difficulty urinating.  Musculoskeletal: Negative for myalgias, back pain, arthralgias and gait problem.  Neurological: Negative for dizziness, tremors, weakness and headaches.  Hematological: Negative for adenopathy.  Psychiatric/Behavioral: Negative for behavioral problems and dysphoric mood.       Objective:   Physical Exam Vitals normal HEENT - [place]/AT, C&S clear,  Neck- supple Nodes no cervical nodes Chest - CTAP Cor- IRIR, rate controlled Abd - soft       Assessment & Plan:  1. HTN  - elevated systolic BP. Reviewed all meds.  Plan continue present meds but increase furosemide to 40 mg qd for better control          Follow-up BP check   2. Lipids -  Not on medication.  3. Dysequilbirium - she is using a walker to prevent falls. She has not had any falls recently  4. CHF- well compensated with no signs of fluid overload.   5.  Generalized weakness - she has no focal findings. Reviewed labs from April that were all in normal range offering no explanation for her weakness other than advanced age.   In summary - a pleasant nanogenerian who appears medically stable at this time. She will return prn or in 6 months.

## 2011-05-07 ENCOUNTER — Telehealth: Payer: Self-pay | Admitting: *Deleted

## 2011-05-07 NOTE — Telephone Encounter (Signed)
Patient requesting a call back to discuss meds further.

## 2011-05-08 NOTE — H&P (Signed)
NAME:  [name], [name]                           ACCOUNT NO.:  [id number]   MEDICAL RECORD NO.:  [id number]                   PATIENT TYPE:  INP   LOCATION:  2041                                 FACILITY:  [facility]   PHYSICIAN:  Wanda Plump, MD [facility]                 DATE OF BIRTH:  13-Mar-1915   DATE OF ADMISSION:  05/17/2003  DATE OF DISCHARGE:                                HISTORY & PHYSICAL   CHIEF COMPLAINT:  Chest pain.   HISTORY OF PRESENT ILLNESS:  [name] is an 77 year old white female who  usually is followed by Dr. [name], who woke up tonight diaphoretic,  nauseated and with severe anterior chest pain.  At that time, she also felt  short of breath.  She was brought to the emergency room by EMS, and she was  found to be in atrial fibrillation with a heart rate around 130.  At the  emergency room, they started to give her Cardizem IV when she converted to  normal sinus rhythm and became completely asymptomatic.  The conversion to  normal sinus was very early during the Cardizem infusion, so the impression  of the ER physician is that she spontaneously went to normal sinus rhythm.  At the time of my exam, she continued to be asymptomatic.   PAST MEDICAL HISTORY:  1. Patient had an acute MI many years ago, and according to the patient,     she had either a stent or a PTCA.  2. Hypertension.  3. High cholesterol.  4. Diverticulosis.   MEDICATIONS:  1. Atenolol 100 mg 1 p.o. q.d.  2. Aspirin 1 a day.  3. She also takes an additional blood pressure medication, but she is unsure     of the name.  4. Tylenol daily.  5. She was supposed to be on cholesterol medicine, but she could not     tolerate it.  6. She denies the use of any other over-the-counter medicines besides     Tylenol.   SOCIAL HISTORY:  Does not smoke or drink.   FAMILY HISTORY:  Noncontributory at this time.   REVIEW OF SYSTEMS:  In general, she has not been feeling well lately.  She  complained  of tiredness and headaches, but she has not been having any chest  pain, leg swelling or orthopnea.  She also denies fever, chills, cough.  She  does have, occasionally, shortness of breath.  She complained about pain on  both of her calves.  By the way she described it, it sounds like  claudication.  She recently had a carotid ultrasound and a lower extremity  ultrasound, but the results, at this point, are unknown to the patient.   ALLERGIES:  She is allergic to CODEINE.   PHYSICAL EXAMINATION:  GENERAL:  The patient is alert, oriented, in no  apparent distress.  VITAL SIGNS:  O2  sat was 98% upon arrival.  Blood pressure was initially  227/97 and now is 147/70; heart rate initially was 132 and now is 60 and  regular according to the monitor.  She is afebrile.  NECK:  JVD is around 6 cm at 45 degrees.  She had good carotid pulses.  LUNGS:  She has fine crackles at the bases.  Otherwise unremarkable.  EXTREMITIES:  She does not have any lower extremity edema, and she has  bilateral pedal pulses even though they are somehow decreased.  CARDIOVASCULAR:  Slightly irregular rate and rhythm with no murmur.  ABDOMEN:  Not distended.  Soft with good bowel sounds.  Nontender.  NEUROLOGICAL:  Speech is clear, coherent and fluent.  Motor exam seems  intact.  External ocular movements are intact.   LABORATORY AND X-RAYS:  Portable chest x-ray, compared to previous one,  shows no change.  There is a slight opacification over the left base, but  this was previously seen.  First set of cardiac enzymes are negative.  Sodium is 134; potassium is 2.9, creatinine 0.8; alkaline phosphate 51, SGOT  24, SGPT 12; blood sugar 117.  White count 6.8, hemoglobin 15, platelets  233.  EKG #1 shows atrial fibrillation with a ventricular rate of around  130.  EKG #2 showed normal sinus rhythm with lateral T wave inversion.  EKG  #3 showed irregular rate with atrial fibrillation.  I see some questionable  P waves,  but I do believe that she is in atrial fibrillation.  There is a  resolution of the lateral T wave inversion.   ASSESSMENT AND PLAN:  1. New-onset of atrial fibrillation.  The EKG looks suspicious for atrial     fibrillation.  The monitor, at times, shows normal sinus rhythm.  I     believe she is coming in and out of atrial fibrillation.  Consequently, I     will start her on intravenous heparin, even though she may not need long-     term anticoagulation.  We will put her on telemetry, check an occult TSH     and consult cardiology in the morning.  2. Chest pain.  She did have ST changes worsened for ischemia.  This,     however, is likely rate-induced.  We will rule her out by serial enzymes     and EKGs.  3. Hypertension.  This seems to be well controlled at present.                                                 Wanda Plump, MD [facility]    JEP/MEDQ  D:  05/17/2003  T:  05/17/2003  Job:  [id number]

## 2011-05-08 NOTE — Discharge Summary (Signed)
NAME:  [name], [name]                 ACCOUNT NO.:  [id number]   MEDICAL RECORD NO.:  [id number]          PATIENT TYPE:  INP   LOCATION:  1603                         FACILITY:  [facility]   PHYSICIAN:  Deidre Ala, M.D.    DATE OF BIRTH:  [birth year]   DATE OF ADMISSION:  11/24/2006  DATE OF DISCHARGE:                               DISCHARGE SUMMARY   ADDENDUM:  Her current medications that she needs to continue in the  skilled nursing facility will be atenolol 50 mg 1 p.o. b.i.d., Protonix  40 mg 1 p.o. daily, Lasix 20 mg 1 p.o. daily, lisinopril 20 mg 1 p.o.  daily, Zocor 40 mg 1 p.o. daily, Plavix 75 mg 1 p.o. daily, aspirin 325  mg 1 p.o. daily, Tylenol 325 to 650 mg p.o. q.4h. p.r.n., Phenergan 25  mg p.o. to be given with Percocet, Percocet 5/325 1-2 p.o. q.4-6h.  p.r.n. pain, Robaxin 500 mg p.o. q.6-8h. p.r.n. spasm, Reglan 10 mg p.o.  q.6-8h. p.r.n. nausea, Restoril 15-30 mg p.o. nightly p.r.n., and Ultram  50 mg 1-2 p.o. q.4-6h. p.r.n. as well.     ______________________________  Clarene Reamer, P.A.-C.    ______________________________  V. Charlesetta Shanks, M.D.    SW/MEDQ  D:  11/26/2006  T:  11/26/2006  Job:  [id number]

## 2011-05-08 NOTE — Consult Note (Signed)
NAME:  [name], [name]                           ACCOUNT NO.:  [id number]   MEDICAL RECORD NO.:  [id number]                   PATIENT TYPE:  INP   LOCATION:  2041                                 FACILITY:  [facility]   PHYSICIAN:  Olga Millers, M.D.                DATE OF BIRTH:  12-28-1914   DATE OF CONSULTATION:  05/17/2003  DATE OF DISCHARGE:                                   CONSULTATION   HISTORY:  [name] is an 77 year old female patient of Dr. Regino Schultze and  Dr. Debby Bud' with a past medical history of hypertension, coronary artery  disease (history of PCI), hyperlipidemia, and diverticulosis who presents  for evaluation of chest pain and atrial fibrillation.  The patient was in  her usual state of health.  She does have mild, chronic dyspnea on exertion,  but there is no orthopnea or PND.  There is no pedal edema.  She does not  have exertional chest pain.  She awoke after going to bed last evening at  approximately 11:30 p.m. with complaints of substernal chest pain as well as  neck pain; the pain was described as a tightness.  There was associated  shortness of breath, diaphoresis, as well as nausea, but there was no  vomiting.  There were no associated palpitations.  She presented to the  emergency room and was found to be in atrial fibrillation with a rapid  ventricular response.  She converted spontaneously to a normal sinus rhythm  and is now pain-free.  Of note, her second troponin was elevated at 0.48.  We were asked to further evaluate.  Also of note, she did have a transient  junctional rhythm this a.m. while on telemetry.   PAST MEDICAL HISTORY:  1. Significant for hypertension, but there is no diabetes mellitus.  2. She does have hyperlipidemia.  3. She has a history of coronary disease, has had a prior infarct as well as     PCI, although I do not have those records available.  4. She has a history of gastroesophageal reflux disease as well as      diverticulitis.   PAST SURGICAL HISTORY:  She has had no prior surgeries.   ALLERGIES:  She is allergic to CODEINE.   SOCIAL HISTORY:  She does not smoke any more.  She continues to consume  alcohol.   FAMILY HISTORY:  Positive for coronary artery disease.   MEDICATIONS AT HOME:  1. Include atenolol 100 mg p.o. daily.  2. Aspirin 81 mg p.o. daily.  3. Avapro 75 mg p.o. daily.  4. Advair.  5. Claritin.  6. Lescol 80 mg p.o. daily.  7. Lasix 20 mg p.o. daily.  8. Prilosec 20 mg p.o. daily.  9. Caltrate.  10.      Librium.   REVIEW OF SYSTEMS:  She denies any headaches or fevers or chills.  There is  no productive  cough or hemoptysis.  There is no dysphagia, odynophagia,  melena, or hematochezia.  She does occasionally have problems with  hemorrhoids, by her report.  She denies any dysuria or hematuria.  There is  no rash or seizure activity.  She has not had orthopnea or PND.  She does  complain of chronic fatigue.  The remainder of systems is negative.   PHYSICAL EXAMINATION:  VITAL SIGNS:  Her physical exam today shows a blood  pressure of 145/68, and her pulse is 89.  She is afebrile.  GENERAL:  She is well developed and well nourished, in no acute distress.  Her skin is warm and dry.  She does not appear to be depressed, and there is  no peripheral clubbing.  HEENT:  Unremarkable with no abnormalities.  NECK:  Supple and nontender bilaterally.  There are no bruits noted.  There  is no jugular venous distention or thyromegaly noted.  CHEST:  Clear to auscultation in all fields.  CARDIOVASCULAR EXAM:  Reveals a regular rate and rhythm with normal S1 and  S2.  There are no murmurs, rubs, or gallops noted.  ABDOMINAL EXAM:  Nontender.  Nondistended.  Positive bowel sounds.  No  hepatosplenomegaly and no mass appreciated.  There is no abdominal bruit.  She has 2+ femoral pulses bilaterally and no bruits.  EXTREMITIES:  Show no edema that I can palpate.  No cords.  She has   diminished pulses distally.  NEUROLOGICAL EXAM:  Grossly intact.   LABORATORY DATA:  Her laboratories show a white blood cell count of 6.6 with  a hemoglobin of 13 and hematocrit or 38.  Her platelet count is 177.  Her  automated diff shows a lymphocyte count of 50% and neutrophils of 38%.  Her  INR is 0.9.  Her sodium is 138 with a potassium of 3.9.  Her glucose is 115,  and her BUN and creatinine are 9 and 0.9.  Her liver functions are normal.  Her initial CK was 63 with an MB of 2.  Her troponin-I was 0.01.  Followup  enzymes show a CK of 74 with an MB of 7.1 and a troponin-I of 0.48.  Her TSH  is mildly elevated at 8.811.  Her chest x-ray shows mild cardiac enlargement  without acute pulmonary process.   DIAGNOSES:  1. Subendocardial myocardial infarction.  2. Atrial fibrillation, now in normal sinus rhythm.  3. Hypertension.  4. Hyperlipidemia.  5. History of diverticulosis.  6. Mild lymphocytosis by automated differential.  7. Transient junctional rhythm this morning.   PLAN:  [name] presents with atrial fibrillation with a rapid ventricular  response, has now converted into sinus rhythm.  She also had chest pain that  she states was similar to her previous infarct pain.  We will plan to treat  her with aspirin, heparin, and I will decrease her atenolol to 50 mg p.o.  daily secondary to bradycardia.  The risks and benefits of cardiac  catheterization have been discussed with the patient, and she agrees to  proceed.  We will also check an echocardiogram to quantify her LV function,  given her atrial fibrillation.  Once her coronaries are addressed, then she  will need Coumadin, as she does have embolic risk factors of age greater  than 23 and hypertension.  We will continue with her statin at present.  We  will also continue with her Avapro.  We do need to obtain office records, as  she apparently had recent carotid  Dopplers and ABIs.  Finally, she does have a mild  lymphocytosis on her automated differential, and we will check a  manual differential tomorrow morning.  We will make further recommendations  once we have the above information.                                               Olga Millers, M.D.    BC/MEDQ  D:  05/17/2003  T:  05/17/2003  Job:  [id number]

## 2011-05-08 NOTE — Discharge Summary (Signed)
NAME:  [name], [name]                           ACCOUNT NO.:  [id number]   MEDICAL RECORD NO.:  [id number]                   PATIENT TYPE:  INP   LOCATION:  6529                                 FACILITY:  [facility]   PHYSICIAN:  Charlies Constable, M.D.                  DATE OF BIRTH:  July 16, 1915   DATE OF ADMISSION:  DATE OF DISCHARGE:  05/19/2003                           DISCHARGE SUMMARY - REFERRING   BRIEF HISTORY:  This is an 77 year old female who was admitted to [facility] on May 17, 2003, for evaluation of chest pain and new onset of  atrial fibrillation. The patient has a past medical history significant for  hypertension, coronary artery disease with previous PCI, history of  hyperlipidemia and a history of diverticulosis.   The patient was apparently in her usual state of health when she awoke on  the morning of admission with substernal chest pain, shortness of breath and  diaphoresis associated with nausea. She did not notice any increased heart  rate or palpitations.  She presented to the emergency room where she was  found to be in atrial fibrillation with rapid ventricular response. She  converted spontaneously to normal sinus rhythm and she was pain free when  seen by Dr. Jens Som. She was actually admitted by Dr. [name].   PAST MEDICAL HISTORY:  1. Hypertension.  2. Hyperlipidemia.  3. Coronary artery disease with previous MI and percutaneous intervention,     details not available.  4. History of gastroesophageal reflux disease.  5. Diverticulosis.   ALLERGIES:  CODEINE.   SOCIAL HISTORY:  The patient does not use tobacco. She does use  alcohol  on  occasion.   FAMILY HISTORY:  Positive for coronary artery disease.   HOSPITAL COURSE:  As noted this patient was admitted to [facility]  with a subendocardial MI associated with new onset atrial fibrillation with  rapid ventricular response. She was also noted to have a transient  junctional  rhythm on the morning of admission. She had been previously been  on Atenolol at 100 mg daily. This was decreased to 50 mg daily.   On May 18, 2003, the patient underwent cardiac catheterization performed by  Dr. Charlies Constable. She was found to have multiple lesions in her right  coronary artery with a normal left ventricle, ejection fraction 70%. She  underwent stenting of the mid and distal RCA as well as the posterolateral  branch. The mid RCA was  80%. It was stented to 10%. The distal RCA was 90%  stented to 0. The posterolateral was 80%  initially. A PTCA was performed  reducing the lesion to 20%.   The patient tolerated this well. She had no further episodes of atrial  fibrillation. Coumadin was discussed, however, the decision was made to  treat with aspirin and Plavix for now. She would need Plavix for at least  6  months. It was felt that a decision regarding the Coumadin could be made in  approximately 3 to 6 months.   LABORATORY DATA:  Chemistry profile on the day of discharge revealed:  BUN  10, creatinine 0.9, creatinine 0.9, potassium  3.9, glucose 110. A CBC on  the day of discharge revealed hemoglobin 12.8, hematocrit 37.8, WBC 6.4  thousand, platelets 173,000. Cardiac enzymes were negative except for an  elevated  troponin of 0.59 as well as an elevated troponin of 0.48. TSH 8.81  which was mildly high.   A chest x-ray showed mild cardiac enlargement without an acute pulmonary  process.   DISCHARGE MEDICATIONS:  1. The patient's Atenolol was decreased to 50 mg daily.  2. Enteric coated aspirin 325 mg daily.  3. Avapro 75 mg daily was added to her medications.  4. Plavix 75 mg daily x6 months.  5. Nitroglycerin p.r.n. chest pain.  6. Lasix 20 mg daily.  7. Prilosec 20 mg daily.  8. Lescol 80 mg daily.  9. Tylenol  p.r.n. pain.   DISCHARGE INSTRUCTIONS:  The patient was told to avoid any strenuous  activity or driving for 2 days. She was not to lift more than 10  pounds for  1 week. She was to be on a low fat, low salt diet. She was told to call the  office if she had any increased pain, swelling or bleeding from her groin.   FOLLOW UP:  She was to call the office on Tuesday for a followup appointment  with Dr. Juanda Chance in 1 to 2 weeks.  She was to see Dr. Debby Bud as instructed.   PROBLEM LIST AT THE TIME OF DISCHARGE:  1. Stents to the right coronary artery as well as percutaneous transluminal     coronary angioplasty of the posterolateral branch on May 18, 2003, as     detailed above.  2. Ejection fraction 70%.  3. History of previous coronary artery disease with a history of myocardial     infarction and previous percutaneous intervention.  4. Hypertension.  5. Elevated cholesterol.  6. History of diverticular disease.  7. New onset atrial fibrillation converted to normal sinus rhythm.  8. Junctional bradycardia, resolved on decreased  Atenolol dose.  9. Questionable recent subendocardial myocardial infarction with elevated     troponins but normal CK-MBs.  10.      A 2D echocardiogram performed on May 17, 2003, revealed an ejection     fraction of 55% to 65% with mild aortic regurgitation and mild mitral     regurgitation. The left atrium was mild to moderately dilated.     Delton See, P.A. [facility]                  Charlies Constable, M.D.    DR/MEDQ  D:  05/19/2003  T:  05/19/2003  Job:  [id number]

## 2011-05-08 NOTE — Discharge Summary (Signed)
NAME:  [name], [name]                           ACCOUNT NO.:  [id number]   MEDICAL RECORD NO.:  [id number]                   PATIENT TYPE:  INP   LOCATION:  6529                                 FACILITY:  [facility]   PHYSICIAN:  Rosalyn Gess. Norins, M.D. [facility]         DATE OF BIRTH:  Apr 18, 1915   DATE OF ADMISSION:  05/17/2003  DATE OF DISCHARGE:  05/19/2003                                 DISCHARGE SUMMARY   ADMISSION DIAGNOSES:  1. Chest pain, rule out myocardial infarction.  2. Transient atrial fibrillation secondary to number 1.  3. Hypertension.  4. Hyperlipidemia.  5. History of diverticulosis.   DISCHARGE DIAGNOSES:  1. Subendocardial myocardial infarction.  2. Atrial fibrillation, which was transient and resolved.  3. Hypertension.  4. Hyperlipidemia.  5. History of diverticulosis.   CONSULTANTS:  Dr. Charlies Constable for cardiology.   PROCEDURES:  1. A 2-D echo performed on May 17, 2003, read out as showing normal left     ventricular size, well-preserved systolic function with an estimated     ejection fraction of 55 to 65%.  No left ventricular regional wall-motion     abnormalities were noted.  Mild aortic valvular regurgitation noted.     Mild mitral valvular regurgitation noted.  2. Cardiac catheterization, which revealed the patient to have tandem     lesions in the RCA, proximal 80%, distal 90%, with a further distal 90%     lesion.  3. PCI.  Patient underwent angioplasty with stent placement in the proximal     and mid lesions, reducing them to 10% and 0%, respectively, with a     cutting atherectomy to the distal lesion going from 80 to 20%.   HISTORY OF PRESENT ILLNESS:  Patient is an 77 year old woman with a known  history of coronary artery disease, status post MI many years ago and  previous PCI.  Patient awoke on the night of admission diaphoretic,  nauseated with severe anterior chest pain.  She also was short of breath.  She was brought to the emergency  department by EMS and was found to be in  transient atrial fibrillation with a rate of 130; she rapidly converted to  normal sinus rhythm and became completely asymptomatic.  Patient, with a  typical presentation of an unstable angina, was admitted to rule out for MI.   PAST MEDICAL HISTORY, FAMILY HISTORY, SOCIAL HISTORY:  Well documented in  the admission note by Dr. Drue Novel.   PHYSICAL EXAMINATION:  VITAL SIGNS:  Admission examination significant for  initial blood pressure of 227/97, which came down to 147/70.  Patient's  initial heart was 132, which came down to 60.  LUNGS:  Fine crackles at the bases.  CARDIOVASCULAR EXAM:  Revealed an irregular rate with no murmur.  NEUROLOGIC EXAM:  Unremarkable.   ADMISSION DATA:  Chest x-ray showed no change.  Initial cardiac enzymes were  negative.  CBC was unremarkable.  Chemistries were unremarkable except for a  potassium of 2.9.  Initial EKG showed atrial fibrillation; second EKG showed  sinus rhythm with lateral T-wave inversion.   HOSPITAL COURSE:  Patient was admitted to a telemetry unit.  She was started  on routine unstable angina orders, including nitroglycerin and heparin.  She  was continued on atenolol.  Cardiac enzymes were obtained, which came back  as mildly elevated CK at 74 with 7.1 CK-MB with a troponin-I of 0.48; the  second CK was 83 with an MB of 6.9 and a troponin-I of 0.59.  Patient was  seen in consultation by the cardiology service.  Given her history,  presentation, and laboratories, she was thought to be at risk for  significant, recurrent coronary artery disease with a subendocardial MI.  A  2-D echo was ordered and performed; results as noted.  Patient was taken to  the cardiac catheterization lab on May 18, 2003, with results as noted  above.  Patient was seen and evaluated post procedure at 1835 hours and was  stable, doing well with no chest pain, chest complaints, or shortness of  breath.   With the patient  being stable with her successfully having had intervention,  as noted, it is anticipated that she will be cleared for discharge on the  morning of May 19, 2003.   DISCHARGE MEDICATIONS:  Patient will continue on all of her home  medications; these include:  1. Atenolol 50 mg daily.  2. Avapro 75 mg daily.  3. Aspirin 325 mg daily.  4. Pravachol 40 mg daily.  5. Additional medications will be ordered by cardiology, and I expect she     will be a candidate for Plavix; these medications will be ordered by     cardiology, as noted.   DISPOSITION:   FOLLOW UP:  1. Patient is to follow up with cardiology, as instructed.  2. She is to see her primary physician, Dr. Debby Bud, in three to four weeks     for routine followup.   FINAL LABORATORY SUMMARY:  Cardiac enzymes, as noted.  Initial CBC with a  white count of 6600 with a hemoglobin of 13.  Initial chemistries, May 17, 2003, with a sodium of 138, potassium 3.9, chloride 102, CO2 of 31, glucose  115, BUN 9, creatinine 0.9.   FINAL EXAMINATION PERFORMED May 18, 2003:  VITAL SIGNS:  Revealed the  patient to be afebrile, her blood pressure was 190/60, her heart rate was  65, her O2 sat was 98%.  GENERAL APPEARANCE:  This is an elderly woman sitting in bed, eating her  supper, comfortable, in no acute distress.  CHEST:  Patient is moving air well.  No rales, wheezes, or rhonchi are  noted.  CARDIOVASCULAR:  There is 2+ radial pulse.  Her precordium is quiet.  She  has a regular rate and rhythm without murmurs.  ABDOMEN:  Soft.  No guarding or rebound.  No further examination conducted.    FINAL DISPOSITION:  Patient will be discharged home with followup as noted.   CONDITION:  Patient's condition at time of this discharge dictation is  stable and improved.                                               Rosalyn Gess Norins, M.D. [facility]   MEN/MEDQ  D:  05/18/2003  T:  05/19/2003  Job:  [id number]

## 2011-05-08 NOTE — Cardiovascular Report (Signed)
NAME:  [name], CAUGHLIN                           ACCOUNT NO.:  [id number]   MEDICAL RECORD NO.:  [id number]                   PATIENT TYPE:  INP   LOCATION:  6529                                 FACILITY:  [facility]   PHYSICIAN:  Charlies Constable, M.D.                  DATE OF BIRTH:  08-20-1915   DATE OF PROCEDURE:  05/18/2003  DATE OF DISCHARGE:  05/19/2003                              CARDIAC CATHETERIZATION   PROCEDURE:  Cardiac catheterization and percutaneous coronary intervention   CARDIOLOGIST:  Bruce R. Juanda Chance, M.D.   CLINICAL HISTORY:  The patient is 77 years old and had been seen by me in  the remote past. She recently was admitted with an episode of paroxysmal  atrial fibrillation and chest pain with positive Troponins. She was  scheduled for evaluation with angiography.   DESCRIPTION OF PROCEDURE:  The procedure was performed via the right femoral  artery using a 6-French sheath and coronary catheters.  Selective injection  of the arterial branch was performed and Omnipaque contrast was used. We  used wire exchange because we had some difficulty advancing the catheter  past the distal aorta. After completion of the diagnostic study, we made the  decision to proceed with intervention on the right coronary artery.   The patient was given Angiomax bolus and infusion. She was given 300 mg of  Plavix. We used a 6-French JR-4 guiding catheter with side holes and a PT-2  light support coronary wire. We crossed the lesions in the proximal, distal  right coronary artery and posterolateral branch without too much difficulty.  We  initially predilated the mid and distal lesions with a 2.5 mm x 15 mm  Quantum Maverick. We performed two inflations up to 12 atmospheres in the  mid lesion and three inflations up to 12 atmospheres in the distal lesion.   We then used a 2.25 x 10 mm cutting balloon to treat the lesion in the  posterolateral branch. We had some difficulty crossing the lesion  but were  able to do this. We performed multiple inflations up to 8 atmospheres x35  seconds.   We then proceeded to stent the lesion in the distal right coronary artery  using a 2.75 x 20 mm TAXUS stent. We deployed this with one inflation at 15  atmospheres x35 seconds. We then deployed a stent in the mid right coronary  artery using a 2.5 x 60 mm TAXUS inflated with one inflation to 11  atmospheres x35 seconds. We then postdilated with a 2.75 x 12 mm Quantum  Maverick, avoiding the proximal edge and deploying one inflation of 12  atmospheres x35 seconds. Repeat diagnosis was then performed through the  guiding catheter.   The patient tolerated the procedure well. She left the laboratory in  satisfactory condition.   RESULTS:  1. The aortic pressure was 140/48 with a mean of 79. The left  ventricular     pressure was 140/16.  2. Left main coronary artery:  The left main coronary artery was free of     major obstruction.  3. Left anterior descending artery:  The left anterior descending artery had     a moderate amount of calcification. The LAD gave rise to three diagonal     branches and a septal perforator. There was 40% narrowing after the     second diagonal branch before the septal perforator. The rest of the     vessel was free of significant obstruction.  4. Circumflex:  The circumflex artery gave rise to a marginal branch and a     posterolateral branch.  There was moderate calcification and     irregularities in the vessel but no significant obstruction.  5. Right coronary artery:  The right coronary artery was a moderately large     vessel that gave rise to the right ventricular branch, posterior     descending branch and two posterolateral branches. There was 80%     narrowing in the mid vessel right at the bend. There was 90% narrowing in     the distal vessel to the complex lesion with some aneurysmal dilatation     in the bend. There was a 90% segmental lesion in the  posterolateral     branch just after the posterior descending branch.  6. Left ventriculogram:  The left ventriculogram was performed in the RAO     projection and showed good wall motion but no areas of hypokinesis. The     estimated ejection fraction was 70%.  7. Distal aortogram:  A distal aortogram was performed which showed patent     renal arteries, although there was fibromuscular dysplasia in the right     renal artery. There were irregularities in the aortoiliac system but no     major obstruction was identified.   Following the stenting of the lesion in the mid right coronary system  obstruction improved from 80% to 10%. Following stenting of the lesion in  the distal right coronary system the obstruction improved from 90% to 0%.  Following cutting balloon angioplasty of the lesion in the posterolateral  branch the obstruction improved from 90% to 20%.   CONCLUSIONS:  1. Coronary artery disease, status post recent non-ST elevation myocardial     infarction with 40% narrowing in the proximal left anterior descending,     irregularities in the circumflex artery, 80% narrowing in the mid right     coronary artery, 90% narrowing in the distal right coronary artery and     90% narrowing in  the posterolateral branch of the right coronary artery     with normal left ventricular function.  2. Fibromuscular dysplasia of the right renal artery.  3. Successful percutaneous coronary intervention of tandem lesions in the     right coronary artery with improvement of the stenosis in the mid right     coronary artery from 80% to 10% with a TAXUS stent, improvement in the     stenosis in the distal right coronary artery from 90% to 0% with a TAXUS     stent, and improvement in the stenosis in the posterolateral branch from     90% to 20% with a cutting balloon.   DISPOSITION:  The patient was retained for further observation.  Charlies Constable,  M.D.    BB/MEDQ  D:  05/18/2003  T:  05/19/2003  Job:  [id number]   cc:   Rosalyn Gess. Norins, M.D. [facility]   Cardiopulmonary Laboratory

## 2011-05-08 NOTE — Discharge Summary (Signed)
NAMEBIRDENA, [name]                 ACCOUNT NO.:  [id number]   MEDICAL RECORD NO.:  [id number]          PATIENT TYPE:  INP   LOCATION:  1603                         FACILITY:  [facility]   PHYSICIAN:  Deidre Ala, M.D.    DATE OF BIRTH:  [birth year]   DATE OF ADMISSION:  11/24/2006  DATE OF DISCHARGE:                               DISCHARGE SUMMARY   ADMISSION DIAGNOSES:  1. Status post fall and greater trochanteric fracture.  2. Hypertension.  3. Gastroesophageal reflux disease.  4. Asthma.  5. Chronic obstructive pulmonary disease.   DISCHARGE DIAGNOSES:  1. Status post fall with greater trochanteric fracture, admitted for      pain control and placement.  2. Hypertension.  3. Gastroesophageal reflux disease.  4. Asthma.  5. Chronic obstructive pulmonary disease.   PROCEDURE:  None.   CONSULTATIONS:  Rosalyn Gess. Norins, MD from Internal Medicine, physical  therapy, occupational therapy, social work and case management.   BRIEF HISTORY:  The patient is an 77 year old female who had a fall at  home.  She had immediate onset of pain and was brought to the [facility] emergency department where x-rays were taken and revealed a greater  trochanteric fracture.  The patient lives alone.  She was admitted to  the hospital for pain control and placement.   LABORATORY DATA:  CBC on admission showed a hemoglobin of 14.6,  hematocrit 43.3, white blood cell count 10.3, red blood cell count 5.02.  Followup CBC was taken on November 25, 2006 and showed a hemoglobin and  hematocrit of 12.1 and 36.1 with white blood cell count 7.1, red blood  cell count 4.17.  __________ on admission was all within normal limits.  Routine chemistries on admission were all within normal limits.  Followup chemistries on November 25, 2006 were within normal limits as  well.  Lipid profile showed triglycerides high at 168, HDL low at 34 and  LDL high at 129.   CLINICAL DATA:  Radiographs taken in the emergency  department revealed a  greater trochanteric fracture on the right hip.   HOSPITAL COURSE:  The patient was admitted to [facility] for  pain control and placement issues due to the fact that she lives alone.  She was seen by Internal Medicine who followed along closely adjusting  her medications appropriate until the patient was stable for discharge.  Orthopedically, the patient had remained in the hospital using pain  medicine as needed.  She worked with physical therapy and occupational  therapy and was slow with physical therapy and occupational therapy who  recommended that she go to short term skilled nursing facility for  continued rehabilitation due to the fact that she does live alone.  She  continues to have pain with range of motion of her hip as well as  difficulty ambulating and the patient will be discharged to the skilled  nursing facility of her choice, possibly on November 26, 2006.   DISPOSITION:  The patient will be discharged to a skilled nursing  facility of her choice on November 26, 2006, or  at a later date.   ACTIVITY:  Weight-bearing as tolerated to her right lower extremity.   WOUND CARE:  Not applicable.   FOLLOWUP:  The patient is to followup with Dr. Renae Fickle in two weeks from  the date of admission.  Office is to be called for an appointment at [phone].   CONDITION ON DISCHARGE:  Stable and improved.     ______________________________  Clarene Reamer, P.A.-C.    ______________________________  V. Charlesetta Shanks, M.D.    SW/MEDQ  D:  11/26/2006  T:  11/26/2006  Job:  [id number]

## 2011-05-11 NOTE — Telephone Encounter (Signed)
Pt called to update [place] on medication she was calling to update Diltiazem 120mg  1 qam. We had already had this on her medication list and by looking back at centricity it looks as thought this was replaced with the clonidine. I informed pt that we have this info correct.

## 2011-08-02 ENCOUNTER — Other Ambulatory Visit: Payer: Self-pay | Admitting: Internal Medicine

## 2011-09-09 ENCOUNTER — Ambulatory Visit (INDEPENDENT_AMBULATORY_CARE_PROVIDER_SITE_OTHER): Payer: Medicare Other | Admitting: *Deleted

## 2011-09-09 DIAGNOSIS — I10 Essential (primary) hypertension: Secondary | ICD-10-CM

## 2011-09-09 DIAGNOSIS — Z23 Encounter for immunization: Secondary | ICD-10-CM

## 2011-09-09 NOTE — Progress Notes (Signed)
Addended by: Anselm Jungling on: 09/09/2011 02:14 PM   Modules accepted: Orders, Level of Service

## 2011-09-09 NOTE — Progress Notes (Signed)
Addended by: Brenton Grills C on: 09/09/2011 02:20 PM   Modules accepted: Orders

## 2011-09-09 NOTE — Progress Notes (Signed)
Addended by: Anselm Jungling on: 09/09/2011 02:09 PM   Modules accepted: Level of Service

## 2011-09-11 LAB — CBC
HCT: 42.3
Hemoglobin: 14.2
MCHC: 32.8
MCV: 85.3
MCV: 85.5
Platelets: 190
RBC: 5.36 — ABNORMAL HIGH
RBC: 5.55 — ABNORMAL HIGH
RDW: 13.8
RDW: 14.2
WBC: 13 — ABNORMAL HIGH
WBC: 16.9 — ABNORMAL HIGH

## 2011-09-11 LAB — DIFFERENTIAL
Basophils Absolute: 0
Basophils Absolute: 0.1
Basophils Relative: 0
Basophils Relative: 0
Lymphocytes Relative: 18
Monocytes Relative: 3
Neutro Abs: 10.3 — ABNORMAL HIGH
Neutro Abs: 16.1 — ABNORMAL HIGH
Neutrophils Relative %: 79 — ABNORMAL HIGH
Neutrophils Relative %: 95 — ABNORMAL HIGH

## 2011-09-11 LAB — BASIC METABOLIC PANEL
CO2: 34 — ABNORMAL HIGH
Calcium: 9.2
Calcium: 9.3
Chloride: 101
Chloride: 98
Chloride: 99
Creatinine, Ser: 1.15
GFR calc Af Amer: 54 — ABNORMAL LOW
GFR calc Af Amer: >60
GFR calc non Af Amer: 50 — ABNORMAL LOW
Potassium: 3.6
Sodium: 141
Sodium: 141

## 2011-09-11 LAB — I-STAT 8, (EC8 V) (CONVERTED LAB)
BUN: 19
Bicarbonate: 22.5
Glucose, Bld: 301 — ABNORMAL HIGH
pCO2, Ven: 68.2 — ABNORMAL HIGH
pH, Ven: 7.127 — CL

## 2011-09-11 LAB — CK TOTAL AND CKMB (NOT AT ARMC)
CK, MB: 6.3 — ABNORMAL HIGH
Relative Index: INVALID
Total CK: 49

## 2011-09-11 LAB — POCT CARDIAC MARKERS
CKMB, poc: <1 — ABNORMAL LOW
Myoglobin, poc: 45.7
Operator id: 196461
Operator id: 196461
Troponin i, poc: <0.05
Troponin i, poc: <0.05

## 2011-09-11 LAB — CARDIAC PANEL(CRET KIN+CKTOT+MB+TROPI)
CK, MB: 4.3 — ABNORMAL HIGH
CK, MB: 9.8 — ABNORMAL HIGH
Relative Index: 2.1
Relative Index: 2.2
Total CK: 276 — ABNORMAL HIGH
Total CK: 284 — ABNORMAL HIGH
Troponin I: 0.8
Troponin I: 0.96

## 2011-09-11 LAB — POCT I-STAT 3, ART BLOOD GAS (G3+)
Acid-base deficit: 3 — ABNORMAL HIGH
Bicarbonate: 24.4 — ABNORMAL HIGH
O2 Saturation: 94
Patient temperature: 98.6
Patient temperature: 99.1
TCO2: 26
pH, Arterial: 7.296 — ABNORMAL LOW
pO2, Arterial: 85

## 2011-09-11 LAB — ABO/RH: ABO/RH(D): A POS

## 2011-09-11 LAB — MAGNESIUM: Magnesium: 2.1

## 2011-09-11 LAB — PHOSPHORUS: Phosphorus: 3

## 2011-09-11 LAB — PROTIME-INR: INR: 1.1

## 2011-09-11 LAB — POCT I-STAT CREATININE: Operator id: 196461

## 2011-09-11 LAB — BLOOD GAS, ARTERIAL
Acid-Base Excess: 1.5
Drawn by: 29757
O2 Content: 6
O2 Saturation: 95.9

## 2011-09-11 LAB — TYPE AND SCREEN

## 2011-09-11 LAB — SAMPLE TO BLOOD BANK

## 2011-09-11 LAB — TROPONIN I: Troponin I: 0.41 — ABNORMAL HIGH

## 2011-09-11 LAB — APTT: aPTT: 31

## 2011-09-11 LAB — B-NATRIURETIC PEPTIDE (CONVERTED LAB): Pro B Natriuretic peptide (BNP): 324 — ABNORMAL HIGH

## 2011-09-14 LAB — DIFFERENTIAL
Basophils Absolute: 0
Basophils Relative: 0
Eosinophils Absolute: 0
Eosinophils Relative: 0

## 2011-09-14 LAB — COMPREHENSIVE METABOLIC PANEL
ALT: 28
AST: 25
Alkaline Phosphatase: 83
CO2: 29
Chloride: 96
Creatinine, Ser: 1.25 — ABNORMAL HIGH
GFR calc Af Amer: 49 — ABNORMAL LOW
GFR calc non Af Amer: 40 — ABNORMAL LOW
Sodium: 132 — ABNORMAL LOW
Total Bilirubin: 0.9

## 2011-09-14 LAB — CBC
MCHC: 33.2
MCV: 85.8
RBC: 4.12
RBC: 4.68
WBC: 9.6
WBC: 16.6 — ABNORMAL HIGH

## 2011-09-14 LAB — CROSSMATCH

## 2011-09-14 LAB — BASIC METABOLIC PANEL
CO2: 37 — ABNORMAL HIGH
Calcium: 8.1 — ABNORMAL LOW
Calcium: 8.4
Creatinine, Ser: 0.98
GFR calc Af Amer: >60
GFR calc Af Amer: >60
GFR calc non Af Amer: 53 — ABNORMAL LOW
Sodium: 141

## 2011-09-14 LAB — HEMOGLOBIN AND HEMATOCRIT, BLOOD: Hemoglobin: 12.6

## 2011-09-15 LAB — HEMOGLOBIN AND HEMATOCRIT, BLOOD
HCT: 31.6 — ABNORMAL LOW
Hemoglobin: 10.5 — ABNORMAL LOW

## 2011-09-15 LAB — CBC
HCT: 30.1 — ABNORMAL LOW
MCV: 85.2
RBC: 3.54 — ABNORMAL LOW
WBC: 12.5 — ABNORMAL HIGH

## 2011-09-15 LAB — BASIC METABOLIC PANEL
Chloride: 101
Creatinine, Ser: 0.79
GFR calc Af Amer: >60
GFR calc non Af Amer: >60
Potassium: 5

## 2011-09-15 LAB — IRON AND TIBC
Iron: 15 — ABNORMAL LOW
Saturation Ratios: 10 — ABNORMAL LOW
UIBC: 128

## 2011-09-15 LAB — PROTIME-INR: Prothrombin Time: 14.6

## 2011-10-19 ENCOUNTER — Ambulatory Visit: Payer: BLUE CROSS/BLUE SHIELD | Admitting: Internal Medicine

## 2011-10-22 ENCOUNTER — Ambulatory Visit (INDEPENDENT_AMBULATORY_CARE_PROVIDER_SITE_OTHER): Payer: Medicare Other | Admitting: Internal Medicine

## 2011-10-22 DIAGNOSIS — M542 Cervicalgia: Secondary | ICD-10-CM

## 2011-10-22 DIAGNOSIS — I10 Essential (primary) hypertension: Secondary | ICD-10-CM

## 2011-10-22 MED ORDER — DILTIAZEM HCL ER BEADS 240 MG PO CP24: 240.0000 mg | ORAL_CAPSULE | Freq: Every day | ORAL | Status: DC

## 2011-10-22 NOTE — Progress Notes (Signed)
  Subjective:    Patient ID: [name], female    DOB: 11-Sep-1915, 77 y.o.   MRN: [id number]  HPI Mrs. Pizzimenti had an episode last Thursday where she was laying in bed and could not raise up her head. She had to manually move her head. She had discomfort but not real pain. She had no other neurologic problems. At this time she has no neck pain. She has no other complaints and seems to be doing well.  Past Medical History  Diagnosis Date  . CAD (coronary artery disease)   . Hyperlipidemia   . Osteoporosis   . PVD (peripheral vascular disease)   . HTN (hypertension)   . Diverticulosis of colon   . GERD (gastroesophageal reflux disease)   . Hip fracture, right   . Decubitus ulcer     Right heel  . COPD (chronic obstructive pulmonary disease)   . Anxiety   . Anemia   . Esophageal stricture   . CHF (congestive heart failure)   . Atrial fibrillation   . Fibromuscular dysplasia     Renal artery   Past Surgical History  Procedure Date  . Ptca 04/2003  . Cataract extraction     Bilateral  . Orif acetabular fracture 2009   No family history on file. History   Social History  . Marital Status: Widowed    Spouse Name: N/A    Number of Children: N/A  . Years of Education: N/A   Occupational History  . Retired, Engineer, manufacturing at [employer], [employer] - Manufacturing engineer    Social History Main Topics  . Smoking status: Never Smoker   . Smokeless tobacco: Never Used  . Alcohol Use: No  . Drug Use: No  . Sexually Active: Not Currently   Other Topics Concern  . Not on file   Social History Narrative   Married '36 - '43 Divorced: Married '46 - '82 widowed . Lives alone, grandson lives w/her. I-ADLs, daughter is close at hand.  END of Life Care: DNR, DNI, No heroic or futile       Review of Systems System review is negative for any constitutional, cardiac, pulmonary, GI or neuro symptoms or complaints other than as described in the HPI.     Objective:   Physical Exam Vitals -  elevated BP noted Gen'l- elderly white woman in no distress HEENT- C&S clear Cor - 2+ radial pusle, RRR Pulm - normal respirations Neuro- A&O x 3, speech clear, cognition normal. CN II-XII normal with normal facial symmetry and movement, EOMI, PERRLA; MS 5/5 throughout; DTRs normal and equal; Cerebellar - no tremor, nl gait.       Assessment & Plan:  Neck pain - resolved at this exam. History does NOT suggest CNS event.  Plan- simple range of motion exercise.

## 2011-10-22 NOTE — Patient Instructions (Signed)
Neck pain - exam today is normal with no evidence of a stroke and no need for any x-rays. For pain or discomfort please do easy exercise - neck rolls. You may also take tylenol 500 mg 1 or 2 tablets every 8 hours. If you have unrelieved pain you may take ibuprofen 1 or 2 at a time every 6 hours .  Blood pressure - running too high. Please increase the diltiazem 120mg  (tiazac) to 2 capsules at a time once a day until you use up your supply. There is a new prescription for the same drug at the 240 mg dose.

## 2011-10-23 NOTE — Assessment & Plan Note (Signed)
BP Readings from Last 3 Encounters:  10/22/11 178/62  05/06/11 180/80  03/30/11 180/78   Consistently elevated BP  Plan - increase diltiazem to 240 mg daily           Follow-up BP visit.

## 2012-02-03 ENCOUNTER — Other Ambulatory Visit: Payer: Self-pay | Admitting: Internal Medicine

## 2012-02-03 NOTE — Telephone Encounter (Signed)
Done

## 2012-02-17 ENCOUNTER — Ambulatory Visit (INDEPENDENT_AMBULATORY_CARE_PROVIDER_SITE_OTHER): Payer: Medicare Other | Admitting: Internal Medicine

## 2012-02-17 ENCOUNTER — Ambulatory Visit: Payer: Medicare Other | Admitting: Internal Medicine

## 2012-02-17 ENCOUNTER — Encounter: Payer: Self-pay | Admitting: Internal Medicine

## 2012-02-17 DIAGNOSIS — R82998 Other abnormal findings in urine: Secondary | ICD-10-CM

## 2012-02-17 DIAGNOSIS — R350 Frequency of micturition: Secondary | ICD-10-CM

## 2012-02-17 DIAGNOSIS — L2089 Other atopic dermatitis: Secondary | ICD-10-CM

## 2012-02-17 DIAGNOSIS — R8290 Unspecified abnormal findings in urine: Secondary | ICD-10-CM

## 2012-02-17 DIAGNOSIS — L209 Atopic dermatitis, unspecified: Secondary | ICD-10-CM

## 2012-02-17 MED ORDER — HYDROXYZINE HCL 10 MG PO TABS: 10.0000 mg | ORAL_TABLET | Freq: Three times a day (TID) | ORAL | Status: AC | PRN

## 2012-02-17 MED ORDER — TRIAMCINOLONE ACETONIDE 0.1 % EX CREA: TOPICAL_CREAM | Freq: Two times a day (BID) | CUTANEOUS | Status: DC

## 2012-02-17 NOTE — Progress Notes (Signed)
  Subjective:    Patient ID: [name], female    DOB: 1915/01/27, 78 y.o.   MRN: [id number]  HPI [name] presents due to a persistent puritic rash distributed over her legs and trunk. She was seen by Dr. Donzetta [name] at [facility], diagnosed with eczema. She was put on triamcinolone 0.1% cream to apply twice a day. She still has some itching.  She has concentrated urine with odor. No fever, no chills, no dysuria  PMH, FamHx and SocHx reviewed for any changes and relevance.    Review of Systems System review is negative for any constitutional, cardiac, pulmonary, GI or neuro symptoms or complaints other than as described in the HPI.     Objective:   Physical Exam Filed Vitals:   02/17/12 1543  BP: 138/72  Pulse: 70  Temp: 96.8 F (36 C)  Resp: 14   Gen'l - well preserved white woman HEENT - normal Cor- 2+ radial pulse, IRIR rate controlled Pulm- normal respirations Derm - small .5-1 cm erythematous macular lesions scattered on her legs.       Assessment & Plan:

## 2012-02-17 NOTE — Patient Instructions (Signed)
Eczema on the legs. Use the triamcinolone cream twice a day, not more. For itching it is ok to use any moisturizing lotion you like, cool compresses and if needed you can take hydroxyzine 10 mg every 6 hours as needed.  Strong urine with odor - no other signs to suggest infection. This is most likely due to concentrated urine. Plan - drink more fluids.    Eczema Atopic dermatitis, or eczema, is an inherited type of sensitive skin. Often people with eczema have a family history of allergies, asthma, or hay fever. It causes a red itchy rash and dry scaly skin. The itchiness may occur before the skin rash and may be very intense. It is not contagious. Eczema is generally worse during the cooler winter months and often improves with the warmth of summer. Eczema usually starts showing signs in infancy. Some children outgrow eczema, but it may last through adulthood. Flare-ups may be caused by:  Eating something or contact with something you are sensitive or allergic to.     Stress.  DIAGNOSIS  The diagnosis of eczema is usually based upon symptoms and medical history. TREATMENT   Eczema cannot be cured, but symptoms usually can be controlled with treatment or avoidance of allergens (things to which you are sensitive or allergic to).  Controlling the itching and scratching.     Use over-the-counter antihistamines as directed for itching. It is especially useful at night when the itching tends to be worse.     Use over-the-counter steroid creams as directed for itching.     Scratching makes the rash and itching worse and may cause impetigo (a skin infection) if fingernails are contaminated (dirty).     Keeping the skin well moisturized with creams every day. This will seal in moisture and help prevent dryness. Lotions containing alcohol and water can dry the skin and are not recommended.     Limiting exposure to allergens.     Recognizing situations that cause stress.     Developing a plan to  manage stress.  HOME CARE INSTRUCTIONS    Take prescription and over-the-counter medicines as directed by your caregiver.     Do not use anything on the skin without checking with your caregiver.     Keep baths or showers short (5 minutes) in warm (not hot) water. Use mild cleansers for bathing. You may add non-perfumed bath oil to the bath water. It is best to avoid soap and bubble bath.     Immediately after a bath or shower, when the skin is still damp, apply a moisturizing ointment to the entire body. This ointment should be a petroleum ointment. This will seal in moisture and help prevent dryness. The thicker the ointment the better. These should be unscented.     Keep fingernails cut short and wash hands often. If your child has eczema, it may be necessary to put soft gloves or mittens on your child at night.     Dress in clothes made of cotton or cotton blends. Dress lightly, as heat increases itching.     Avoid foods that may cause flare-ups. Common foods include cow's milk, peanut butter, eggs and wheat.     Keep a child with eczema away from anyone with fever blisters. The virus that causes fever blisters (herpes simplex) can cause a serious skin infection in children with eczema.  SEEK MEDICAL CARE IF:    Itching interferes with sleep.     The rash gets worse or is not  better within one week following treatment.     The rash looks infected (pus or soft yellow scabs).     You or your child has an oral temperature above 102 F (38.9 C).     Your baby is older than 3 months with a rectal temperature of 100.5 F (38.1 C) or higher for more than 1 day.     The rash flares up after contact with someone who has fever blisters.  SEEK IMMEDIATE MEDICAL CARE IF:    Your baby is older than 3 months with a rectal temperature of 102 F (38.9 C) or higher.     Your baby is older than 3 months or younger with a rectal temperature of 100.4 F (38 C) or higher.  Document Released:  12/04/2000 Document Revised: 08/19/2011 Document Reviewed: 10/09/2009 [facility] Patient Information 2012 [place], [place].

## 2012-02-18 DIAGNOSIS — L209 Atopic dermatitis, unspecified: Secondary | ICD-10-CM | POA: Insufficient documentation

## 2012-02-18 NOTE — Assessment & Plan Note (Signed)
Seen at [facility] derm by Dr. Yetta Barre - diagnosed with eczema and prescribed triamcinolone cream BID. She has been using this more frequently for itching! She does admit that the rash is better.  Plan - renewed triamcinolone with strict instructions to only use as directed. No not use for itching or to use otc cortisone cream           hydroxazine 10 mg q 6 for itching.

## 2012-03-06 ENCOUNTER — Other Ambulatory Visit: Payer: Self-pay

## 2012-03-06 ENCOUNTER — Emergency Department (HOSPITAL_COMMUNITY): Payer: Medicare Other

## 2012-03-06 ENCOUNTER — Emergency Department (INDEPENDENT_AMBULATORY_CARE_PROVIDER_SITE_OTHER)
Admission: EM | Admit: 2012-03-06 | Discharge: 2012-03-06 | Disposition: A | Discharge status: Nursing Home (Includes ICF) | Payer: Medicare Other | Source: Home / Self Care | Attending: Emergency Medicine | Admitting: Emergency Medicine

## 2012-03-06 ENCOUNTER — Inpatient Hospital Stay (HOSPITAL_COMMUNITY)
Admission: EM | Admit: 2012-03-06 | Discharge: 2012-03-11 | DRG: 291 | Disposition: A | Discharge status: Home Health | Payer: Medicare Other | Source: Ambulatory Visit | Attending: Internal Medicine | Admitting: Internal Medicine

## 2012-03-06 ENCOUNTER — Encounter (HOSPITAL_COMMUNITY): Payer: Self-pay

## 2012-03-06 ENCOUNTER — Encounter (HOSPITAL_COMMUNITY): Payer: Self-pay | Admitting: *Deleted

## 2012-03-06 DIAGNOSIS — F411 Generalized anxiety disorder: Secondary | ICD-10-CM

## 2012-03-06 DIAGNOSIS — I509 Heart failure, unspecified: Principal | ICD-10-CM

## 2012-03-06 DIAGNOSIS — R0902 Hypoxemia: Secondary | ICD-10-CM | POA: Diagnosis present

## 2012-03-06 DIAGNOSIS — Z66 Do not resuscitate: Secondary | ICD-10-CM | POA: Diagnosis present

## 2012-03-06 DIAGNOSIS — J810 Acute pulmonary edema: Secondary | ICD-10-CM | POA: Diagnosis present

## 2012-03-06 DIAGNOSIS — N179 Acute kidney failure, unspecified: Secondary | ICD-10-CM | POA: Diagnosis present

## 2012-03-06 DIAGNOSIS — I447 Left bundle-branch block, unspecified: Secondary | ICD-10-CM | POA: Diagnosis present

## 2012-03-06 DIAGNOSIS — I214 Non-ST elevation (NSTEMI) myocardial infarction: Secondary | ICD-10-CM

## 2012-03-06 DIAGNOSIS — Z7902 Long term (current) use of antithrombotics/antiplatelets: Secondary | ICD-10-CM

## 2012-03-06 DIAGNOSIS — R55 Syncope and collapse: Secondary | ICD-10-CM

## 2012-03-06 DIAGNOSIS — I4891 Unspecified atrial fibrillation: Secondary | ICD-10-CM

## 2012-03-06 DIAGNOSIS — J9600 Acute respiratory failure, unspecified whether with hypoxia or hypercapnia: Secondary | ICD-10-CM | POA: Diagnosis present

## 2012-03-06 DIAGNOSIS — I4510 Unspecified right bundle-branch block: Secondary | ICD-10-CM | POA: Diagnosis present

## 2012-03-06 DIAGNOSIS — I10 Essential (primary) hypertension: Secondary | ICD-10-CM

## 2012-03-06 DIAGNOSIS — R5381 Other malaise: Secondary | ICD-10-CM | POA: Diagnosis present

## 2012-03-06 DIAGNOSIS — J90 Pleural effusion, not elsewhere classified: Secondary | ICD-10-CM | POA: Diagnosis present

## 2012-03-06 DIAGNOSIS — J189 Pneumonia, unspecified organism: Secondary | ICD-10-CM | POA: Diagnosis present

## 2012-03-06 DIAGNOSIS — N390 Urinary tract infection, site not specified: Secondary | ICD-10-CM | POA: Diagnosis present

## 2012-03-06 DIAGNOSIS — I169 Hypertensive crisis, unspecified: Secondary | ICD-10-CM | POA: Diagnosis present

## 2012-03-06 DIAGNOSIS — I219 Acute myocardial infarction, unspecified: Secondary | ICD-10-CM | POA: Diagnosis present

## 2012-03-06 HISTORY — DX: Acute myocardial infarction, unspecified: I21.9

## 2012-03-06 LAB — BASIC METABOLIC PANEL
CO2: 27 mEq/L (ref 19–32)
Calcium: 9.5 mg/dL (ref 8.4–10.5)
Sodium: 139 mEq/L (ref 135–145)

## 2012-03-06 LAB — URINE MICROSCOPIC-ADD ON

## 2012-03-06 LAB — URINALYSIS, ROUTINE W REFLEX MICROSCOPIC
Ketones, ur: 15 mg/dL — AB
Nitrite: POSITIVE — AB
Protein, ur: 30 mg/dL — AB
Urobilinogen, UA: 1 mg/dL (ref 0.0–1.0)

## 2012-03-06 LAB — POCT I-STAT, CHEM 8
Calcium, Ion: 1.23 mmol/L (ref 1.12–1.32)
Chloride: 102 mEq/L (ref 96–112)
Glucose, Bld: 112 mg/dL — ABNORMAL HIGH (ref 70–99)
HCT: 44 % (ref 36.0–46.0)
Hemoglobin: 15 g/dL (ref 12.0–15.0)
TCO2: 29 mmol/L (ref 0–100)

## 2012-03-06 LAB — DIFFERENTIAL
Basophils Absolute: 0 10*3/uL (ref 0.0–0.1)
Basophils Relative: 0 % (ref 0–1)
Eosinophils Absolute: 0.1 10*3/uL (ref 0.0–0.7)
Eosinophils Relative: 1 % (ref 0–5)
Monocytes Absolute: 0.5 10*3/uL (ref 0.1–1.0)

## 2012-03-06 LAB — CBC
HCT: 43.8 % (ref 36.0–46.0)
MCHC: 32.6 g/dL (ref 30.0–36.0)
MCV: 85.9 fL (ref 78.0–100.0)
RDW: 13.5 % (ref 11.5–15.5)

## 2012-03-06 LAB — CARDIAC PANEL(CRET KIN+CKTOT+MB+TROPI)
Relative Index: 3.8 — ABNORMAL HIGH (ref 0.0–2.5)
Total CK: 286 U/L — ABNORMAL HIGH (ref 7–177)

## 2012-03-06 MED ORDER — MUPIROCIN 2 % EX OINT
1.0000 "application " | TOPICAL_OINTMENT | Freq: Two times a day (BID) | CUTANEOUS | Status: AC
  Administered 2012-03-06 – 2012-03-11 (×10): 1 via NASAL
  Filled 2012-03-06: qty 22

## 2012-03-06 MED ORDER — LABETALOL HCL 5 MG/ML IV SOLN
INTRAVENOUS | Status: AC
  Administered 2012-03-06: 17:00:00
  Filled 2012-03-06: qty 4

## 2012-03-06 MED ORDER — SODIUM CHLORIDE 0.9 % IV SOLN
Freq: Once | INTRAVENOUS | Status: AC
  Administered 2012-03-06: 13:00:00 via INTRAVENOUS

## 2012-03-06 MED ORDER — MORPHINE SULFATE 4 MG/ML IJ SOLN
INTRAMUSCULAR | Status: AC
  Administered 2012-03-06: 4 mg
  Filled 2012-03-06: qty 1

## 2012-03-06 MED ORDER — CHLORHEXIDINE GLUCONATE CLOTH 2 % EX PADS
6.0000 | MEDICATED_PAD | Freq: Every day | CUTANEOUS | Status: DC
  Administered 2012-03-07 – 2012-03-09 (×3): 6 via TOPICAL

## 2012-03-06 MED ORDER — SODIUM CHLORIDE 0.9 % IV SOLN
INTRAVENOUS | Status: DC
  Administered 2012-03-06 – 2012-03-07 (×2): via INTRAVENOUS

## 2012-03-06 MED ORDER — NITROGLYCERIN IN D5W 200-5 MCG/ML-% IV SOLN
5.0000 ug/min | INTRAVENOUS | Status: DC
  Administered 2012-03-06: 5 ug/min via INTRAVENOUS
  Administered 2012-03-07: 40 ug/min via INTRAVENOUS
  Administered 2012-03-07: 20 ug/min via INTRAVENOUS
  Filled 2012-03-06 (×2): qty 250

## 2012-03-06 MED ORDER — MORPHINE SULFATE 2 MG/ML IJ SOLN
1.0000 mg | INTRAMUSCULAR | Status: DC | PRN
  Administered 2012-03-07: 1 mg via INTRAVENOUS
  Filled 2012-03-06: qty 1

## 2012-03-06 MED ORDER — ASPIRIN 300 MG RE SUPP
300.0000 mg | RECTAL | Status: AC
  Filled 2012-03-06: qty 1

## 2012-03-06 MED ORDER — ONDANSETRON HCL 4 MG/2ML IJ SOLN
INTRAMUSCULAR | Status: AC
  Administered 2012-03-06: 17:00:00
  Filled 2012-03-06: qty 2

## 2012-03-06 MED ORDER — FUROSEMIDE 10 MG/ML IJ SOLN
INTRAMUSCULAR | Status: AC
  Filled 2012-03-06: qty 4

## 2012-03-06 MED ORDER — MORPHINE SULFATE 2 MG/ML IJ SOLN
INTRAMUSCULAR | Status: AC
  Administered 2012-03-06: 2 mg via INTRAVENOUS
  Filled 2012-03-06: qty 1

## 2012-03-06 MED ORDER — FUROSEMIDE 10 MG/ML IJ SOLN
40.0000 mg | INTRAMUSCULAR | Status: AC
  Administered 2012-03-06: 40 mg via INTRAVENOUS
  Filled 2012-03-06: qty 4

## 2012-03-06 MED ORDER — FUROSEMIDE 10 MG/ML IJ SOLN
40.0000 mg | Freq: Once | INTRAMUSCULAR | Status: AC
  Administered 2012-03-06: 40 mg via INTRAVENOUS

## 2012-03-06 MED ORDER — SUCCINYLCHOLINE CHLORIDE 20 MG/ML IJ SOLN
INTRAMUSCULAR | Status: AC
  Filled 2012-03-06: qty 10

## 2012-03-06 MED ORDER — ROCURONIUM BROMIDE 50 MG/5ML IV SOLN
INTRAVENOUS | Status: AC
  Filled 2012-03-06: qty 2

## 2012-03-06 MED ORDER — ASPIRIN 81 MG PO CHEW
324.0000 mg | CHEWABLE_TABLET | ORAL | Status: AC
  Administered 2012-03-06: 324 mg via ORAL
  Filled 2012-03-06: qty 4

## 2012-03-06 MED ORDER — LIDOCAINE HCL (CARDIAC) 20 MG/ML IV SOLN
INTRAVENOUS | Status: AC
  Filled 2012-03-06: qty 5

## 2012-03-06 MED ORDER — ETOMIDATE 2 MG/ML IV SOLN
INTRAVENOUS | Status: AC
  Filled 2012-03-06: qty 20

## 2012-03-06 NOTE — ED Provider Notes (Signed)
4:47 PM Called STAT into pt's room for acute shortness of breath.  She is a 78 yo woman who has had syncope today and was found to have a NSTEMI.  Exam showed her to be in acute distress, hyperventilating, O2 sat in 60's.  Lungs showed rales over both lung fields.  Was getting ready to intubate pt, when admitting physician called pt's family and found that they do not want her to have resuscitative measures.  Admitting physician ordered IV morphine for her air hunger.   Carleene Cooper III, MD 03/06/12 [phone]

## 2012-03-06 NOTE — ED Notes (Signed)
Pt. Rang out to use a bed pan to urinate. Pt. C/o pain as fx. Bed pan placed. Admitting md at bedside. Pt. Became restless, air hunger, pale, nauseated, emesis. For comfort, a foley catheter was placed, which inc. Pt. Pain, restlessness, agitation. As a result, pt. hyperventilating and o2 sats. Started to decline in 80's, then 60's. Pt. Placed on 100% NRB and then required to bmv, which she did not tolerate b/c of feeling hot. Morphine given.  So, blow by with bmv in front of her, and sats. Slowly increasing to the 80's - 90's. Family called by MD to ask DNR status. DNR status confirmed.

## 2012-03-06 NOTE — ED Notes (Signed)
[facility] does not have a truck.  EMS called

## 2012-03-06 NOTE — ED Notes (Signed)
MD at bedside. Critical Care Mediine.

## 2012-03-06 NOTE — ED Notes (Signed)
Pt. Fell last night when walking to bathroom - blacked out. Syncopal episode - fell on back and has in her lower back, non-radiating. sbp 218 at [facility]. Here b/c of the back pain. Alert and oriented x 4, cms intact x4.

## 2012-03-06 NOTE — ED Notes (Signed)
Family aware of pt. Status, and verbalizes understanding of plan of care.

## 2012-03-06 NOTE — Progress Notes (Signed)
CRITICAL VALUE ALERT  Critical value received:  MRSA positive nasal   Date of notification:  3/17  Time of notification:  2029  Critical value read back:yes  Nurse who received alert:  Doren Custard  MD notified (1st page): Dr. Tula Nakayama  Time of first page:  2029  MD notified (2nd page):  Time of second page:  Responding MD: DR.Bice  Time MD responded:  Dr. Tula Nakayama

## 2012-03-06 NOTE — ED Notes (Signed)
Pt. Responsive, o2 sats 90', bp decreased after labetalol; skin color now pinker.

## 2012-03-06 NOTE — ED Notes (Signed)
I-stat troponin of 4.90 reported to Dr.Pickering

## 2012-03-06 NOTE — ED Notes (Signed)
Report given to Danford Bad, ed charge nurse. Pt transported to [facility] Room via [facility]

## 2012-03-06 NOTE — ED Notes (Signed)
Pt states she fell last night while on walker. Doesn't remember falling, states having back pain and vomited once today.

## 2012-03-06 NOTE — ED Notes (Signed)
1645: pt. Showing improvement in oxygent ation.

## 2012-03-06 NOTE — H&P (Addendum)
Name: [name] MRN: [id number] DOB: 09-22-1915    LOS: 0  PCCM ADMISSION NOTE  PCP : Norins History of Present Illness: 78 y.o. female adm 3/17 for acute pulm edema , hypertensive crisis & NSTEMI. History as obtained from ED - Pt s/p syncopal episode at 0645 this am. Has an extensive medical history, including A. fib, MI x 2, HTN on cardizem & aten. States she was walking and syncopized without warning. Does not recall hitting her head. No tunnel vision, nausea, diaphoresis, palpitations, CP, SOB preceding fall. Is currently c/o lower back pain, and states she vomited once this morning. Currently no headache, visual changes, neck pain, chest pain,, shortness of breath, abdominal pain. No amnesia, dysarthria, focal weakness. Patient has been otherwise in her usual state of health prior to the fall. satn 92% RA, EKG- LBBB, similar to 2009. Developed increased hypoxia, daughter was called & agreed to DNR status - PCCM called to admit due to tenuous status.morphine & IV NTG given with some improvement. BP 247/83 recorded highest now down to 157/70        The patient is unable to provide history, which was obtained for available medical records.    Past Medical History  Diagnosis Date  . CAD (coronary artery disease)   . Hyperlipidemia   . Osteoporosis   . PVD (peripheral vascular disease)   . HTN (hypertension)   . Diverticulosis of colon   . GERD (gastroesophageal reflux disease)   . Hip fracture, right   . Decubitus ulcer     Right heel  . COPD (chronic obstructive pulmonary disease)   . Anxiety   . Anemia   . Esophageal stricture   . CHF (congestive heart failure)   . Atrial fibrillation   . Fibromuscular dysplasia     Renal artery  . Myocardial infarction    Past Surgical History  Procedure Date  . Ptca 04/2003  . Cataract extraction     Bilateral  . Orif acetabular fracture 2009   Prior to Admission medications   Medication Sig Start Date End Date Taking?  Authorizing Provider  aspirin 325 MG tablet Take 325 mg by mouth daily.     Yes Historical Provider, MD  atenolol (TENORMIN) 100 MG tablet Take 100 mg by mouth daily. 05/06/11 05/05/12 Yes Jacques Navy, MD  cilostazol (PLETAL) 100 MG tablet Take 100 mg by mouth 2 (two) times daily.   Yes Historical Provider, MD  clopidogrel (PLAVIX) 75 MG tablet Take 75 mg by mouth daily.   Yes Historical Provider, MD  diltiazem (TIAZAC) 240 MG 24 hr capsule Take 240 mg by mouth daily. 10/22/11  Yes Jacques Navy, MD  fluticasone (FLONASE) 50 MCG/ACT nasal spray Place 1 spray into the nose 2 (two) times daily. 03/30/11 03/29/12 Yes Jacques Navy, MD  furosemide (LASIX) 40 MG tablet Take 40 mg by mouth daily. 05/06/11 05/05/12 Yes Jacques Navy, MD  triamcinolone cream (KENALOG) 0.1 % Apply 1 application topically 2 (two) times daily. 02/17/12 02/16/13 Yes Jacques Navy, MD  benzonatate (TESSALON PERLES) 100 MG capsule Take 1 capsule (100 mg total) by mouth 3 (three) times daily as needed for cough. 05/06/11 05/05/12  Jacques Navy, MD   Allergies Allergies  Allergen Reactions  . Codeine Phosphate     REACTION: unspecified  . Doxycycline Other (See Comments)    unknown  . Erythromycin Other (See Comments)    unknown    Family History No family history on file.  Social History  reports that she has never smoked. She has never used smokeless tobacco. She reports that she does not drink alcohol or use illicit drugs.  Review Of Systems  11 points review of systems is negative with an exception of listed in HPI. - unable to obtain , pain is better  Vital Signs: Temp:  [97.6 F (36.4 C)-98.2 F (36.8 C)] 98.2 F (36.8 C) (03/17 1402) Pulse Rate:  [74-137] 114  (03/17 1700) Resp:  [14-37] 32  (03/17 1700) BP: (167-247)/(46-117) 247/83 mmHg (03/17 1700) SpO2:  [71 %-98 %] 80 % (03/17 1700)    Physical Examination: Gen. Pleasant, elderly,well-nourished, in moderate distress, normal affect, 88%  on NRB ENT - no lesions, no post nasal drip Neck: No JVD, no thyromegaly, no carotid bruits Lungs: no use of accessory muscles, no dullness to percussion, BL 1/3  Rales, no rhonchi  Cardiovascular: Rhythm regular, heart sounds  normal, no murmurs, no peripheral edema Abdomen: soft and non-tender, no hepatosplenomegaly, BS normal. Musculoskeletal: No deformities, no cyanosis or clubbing Neuro:  alert, non focal Skin:  Warm, no lesions/ rash   Ventilator settings:none    Labs and Imaging:  Reviewed.  Please refer to the Assessment and Plan section for relevant results.  Assessment and Plan:  Patient Active Hospital Problem List: Acute pulmonary edema (03/06/2012)   Assessment: induced by htn crisis   Plan: lasix 40 & q 4h x 2 more doses based on response  Acute respiratory failure (03/06/2012)   Assessment: related to above   Plan: DNR noted, confirmed by daughter     NRB, use morphine if worse  Hypertensive crisis (03/06/2012)   Assessment:induced by pain ?   Plan:  IV nTG drip, labetolol given  Myocardial infarction acute (03/06/2012)   Assessment: related to above, LBBB is chronic   Plan: ASA, ct plavix, no heparin -doubt aortic dissection here - pain appears to be low back  Syncope - If she survives ,may have to look into why she passed out    Best practices / Disposition: [facility] Debby Bud to assume care in am -->SDU status under PCCM -->DNR --> SCds  for DVT Px -->Protonix for GI Px _-> npo for now -->daughter updated on the phone  The patient is critically ill with multiple organ systems failure and requires high complexity decision making for assessment and support, frequent evaluation and titration of therapies, application of advanced monitoring technologies and extensive interpretation of multiple databases. Critical Care Time devoted to patient care services described in this note is 45 minutes.  Efren Kross V. 03/06/2012, 6:04 PM

## 2012-03-06 NOTE — ED Provider Notes (Signed)
History     CSN: [id number]  Arrival date & time 03/06/12  1242   First MD Initiated Contact with Patient 03/06/12 1305      Chief Complaint  Patient presents with  . Loss of Consciousness    fall, back pain    (Consider location/radiation/quality/duration/timing/severity/associated sxs/prior treatment) HPI Comments: Pt s/p syncopal episode at 0645 this am. Has an extensive medical history, including A. fib, MI x 2, HTN.  States she was walking and syncopized without warning. Does not recall hitting her head.  No tunnel vision, nausea, diaphoresis, palpitations, CP, SOB preceding fall.  Is currently c/o lower back pain, and states she vomited once this morning. Currently no headache, visual changes, neck pain, chest pain,, shortness of breath, abdominal pain.  No amnesia, dysarthria, focal weakness. Patient states that she had a no other medication added to her regimen, but does not remember the name of it. Patient has been otherwise in her usual state of health prior to the fall.  ROS as noted in HPI. All other ROS negative.   Patient is a 78 y.o. female presenting with syncope. The history is provided by the patient. No language interpreter was used.  Loss of Consciousness This is a new problem. The current episode started 6 to 12 hours ago. Pertinent negatives include no chest pain, no abdominal pain, no headaches and no shortness of breath. The symptoms are aggravated by nothing. The symptoms are relieved by nothing. She has tried nothing for the symptoms. The treatment provided no relief.    Past Medical History  Diagnosis Date  . CAD (coronary artery disease)   . Hyperlipidemia   . Osteoporosis   . PVD (peripheral vascular disease)   . HTN (hypertension)   . Diverticulosis of colon   . GERD (gastroesophageal reflux disease)   . Hip fracture, right   . Decubitus ulcer     Right heel  . COPD (chronic obstructive pulmonary disease)   . Anxiety   . Anemia   . Esophageal  stricture   . CHF (congestive heart failure)   . Atrial fibrillation   . Fibromuscular dysplasia     Renal artery  . Myocardial infarction     Past Surgical History  Procedure Date  . Ptca 04/2003  . Cataract extraction     Bilateral  . Orif acetabular fracture 2009    No family history on file.  History  Substance Use Topics  . Smoking status: Never Smoker   . Smokeless tobacco: Never Used  . Alcohol Use: No    OB History    Grav Para Term Preterm Abortions TAB SAB Ect Mult Living                  Review of Systems  Respiratory: Negative for shortness of breath.   Cardiovascular: Positive for syncope. Negative for chest pain.  Gastrointestinal: Negative for abdominal pain.  Neurological: Negative for headaches.    Allergies  Codeine phosphate; Doxycycline; and Erythromycin  Home Medications   Current Outpatient Rx  Name Route Sig Dispense Refill  . ASPIRIN 325 MG PO TABS Oral Take 325 mg by mouth daily.      . ATENOLOL 100 MG PO TABS Oral Take 1 tablet (100 mg total) by mouth daily. 30 tablet 11  . BENZONATATE 100 MG PO CAPS Oral Take 1 capsule (100 mg total) by mouth 3 (three) times daily as needed for cough. 30 capsule 0  . BISACODYL 10 MG RE SUPP Rectal Place  10 mg rectally as needed.      Marland Kitchen CILOSTAZOL 100 MG PO TABS  TAKE 1 TABLET BY MOUTH TWICE A DAY AS NEEDED FOR CIRCULATION 60 tablet 3  . CLOPIDOGREL BISULFATE 75 MG PO TABS  TAKE 1 TABLET BY MOUTH EVERY DAY 30 tablet 4  . DILTIAZEM HCL ER BEADS 240 MG PO CP24 Oral Take 1 capsule (240 mg total) by mouth daily. 30 capsule 11  . FLUTICASONE PROPIONATE 50 MCG/ACT NA SUSP Nasal 1 spray by Nasal route 2 (two) times daily. 16 g 11  . FUROSEMIDE 40 MG PO TABS Oral Take 1 tablet (40 mg total) by mouth daily. 30 tablet 11  . RANITIDINE HCL 150 MG PO TABS  TAKE 1 TABLET BY MOUTH TWICE A DAY FOR STOMACH 60 tablet 5  . TRIAMCINOLONE ACETONIDE 0.1 % EX CREA Topical Apply topically 2 (two) times daily. 30 g 0  .  ZOLPIDEM TARTRATE 10 MG PO TABS Oral Take 10 mg by mouth at bedtime as needed.        BP 220/80  Pulse 76  Temp(Src) 97.6 F (36.4 C) (Oral)  Resp 20  SpO2 94%  Physical Exam  Nursing note and vitals reviewed. Constitutional: She is oriented to person, place, and time. She appears well-developed and well-nourished.  HENT:  Head: Normocephalic and atraumatic.  Eyes: Conjunctivae and EOM are normal. Pupils are equal, round, and reactive to light.  Neck: Normal range of motion. No spinous process tenderness and no muscular tenderness present.  Cardiovascular: Normal rate, regular rhythm, normal heart sounds and intact distal pulses.   No murmur heard. Pulmonary/Chest: Effort normal. No respiratory distress. She has no wheezes. She has no rales. She exhibits no tenderness.       Bruise posterior right chest. No rib or chest wall tenderness  Abdominal: Soft. Bowel sounds are normal. She exhibits no distension. There is no tenderness. There is no rebound, no guarding and no CVA tenderness.  Musculoskeletal: Normal range of motion. She exhibits no edema and no tenderness.       Thoracic back: She exhibits no bony tenderness.       Lumbar back: She exhibits bony tenderness. She exhibits no swelling and no edema.       Back:  Neurological: She is alert and oriented to person, place, and time.  Skin: Skin is warm and dry.  Psychiatric: She has a normal mood and affect. Her behavior is normal. Judgment and thought content normal.    ED Course  Procedures (including critical care time)  Labs Reviewed  GLUCOSE, CAPILLARY - Abnormal; Notable for the following:    Glucose-Capillary 108 (*)    All other components within normal limits   No results found.   1. Syncope    EKG: Rate 81. Sinus rhythm, normal axis, left bundle-branch block. No hypertrophy. T-wave inversion in V5, V6, present in previous EKG from February 2009. No acute changes.    MDM   Patient is satting 92% room air,  and is hypertensive. Patient otherwise awake, alert, neurologically intact. Pulses equal upper, lower extremities. Abdomen benign. Lungs clear, poor air movement. O2 sat increased to 96% on supplement oxygen. Patient has tenderness at L5-S1. EKG shows no acute changes compared to EKG from 2009. Glucose acceptable. Concern for ACS, HTN emergency, arrhythmia, or other serious cause of her syncope. Transferring to the ED.    Luiz Blare, MD 03/06/12 1348

## 2012-03-06 NOTE — ED Provider Notes (Signed)
History     CSN: [id number]  Arrival date & time 03/06/12  1352   First MD Initiated Contact with Patient 03/06/12 1353      Chief Complaint  Patient presents with  . Fall    (Consider location/radiation/quality/duration/timing/severity/associated sxs/prior treatment) Patient is a 78 y.o. female presenting with fall. The history is provided by the patient.  Fall Pertinent negatives include no numbness, no abdominal pain, no nausea, no vomiting and no headaches.   patient reportedly fell last night. She states she is walking with a walker and just passed out. She denies chest pain. 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 She states she's been feeling okay otherwise. No headache. He's had some mild back pain fall. She has a previous history of coronary artery disease and MIs. She feels slightly weak but otherwise at baseline now.  Past Medical History  Diagnosis Date  . CAD (coronary artery disease)   . Hyperlipidemia   . Osteoporosis   . PVD (peripheral vascular disease)   . HTN (hypertension)   . Diverticulosis of colon   . GERD (gastroesophageal reflux disease)   . Hip fracture, right   . Decubitus ulcer     Right heel  . COPD (chronic obstructive pulmonary disease)   . Anxiety   . Anemia   . Esophageal stricture   . CHF (congestive heart failure)   . Atrial fibrillation   . Fibromuscular dysplasia     Renal artery  . Myocardial infarction     Past Surgical History  Procedure Date  . Ptca 04/2003  . Cataract extraction     Bilateral  . Orif acetabular fracture 2009    No family history on file.  History  Substance Use Topics  . Smoking status: Never Smoker   . Smokeless tobacco: Never Used  . Alcohol Use: No    OB History    Grav Para Term Preterm Abortions TAB SAB Ect Mult Living                  Review of Systems  Constitutional: Negative for activity change and appetite change.  HENT: Negative for neck stiffness.   Eyes: Negative for pain.    Respiratory: Negative for chest tightness and shortness of breath.   Cardiovascular: Negative for chest pain and leg swelling.  Gastrointestinal: Negative for nausea, vomiting, abdominal pain and diarrhea.  Genitourinary: Negative for flank pain.  Musculoskeletal: Positive for back pain.  Skin: Negative for rash.  Neurological: Positive for syncope. Negative for weakness, numbness and headaches.  Psychiatric/Behavioral: Negative for behavioral problems.    Allergies  Codeine phosphate; Doxycycline; and Erythromycin  Home Medications   Current Outpatient Rx  Name Route Sig Dispense Refill  . ASPIRIN 325 MG PO TABS Oral Take 325 mg by mouth daily.      . ATENOLOL 100 MG PO TABS Oral Take 1 tablet (100 mg total) by mouth daily. 30 tablet 11  . BENZONATATE 100 MG PO CAPS Oral Take 1 capsule (100 mg total) by mouth 3 (three) times daily as needed for cough. 30 capsule 0  . BISACODYL 10 MG RE SUPP Rectal Place 10 mg rectally as needed.      Marland Kitchen CILOSTAZOL 100 MG PO TABS  TAKE 1 TABLET BY MOUTH TWICE A DAY AS NEEDED FOR CIRCULATION 60 tablet 3  . CLOPIDOGREL BISULFATE 75 MG PO TABS  TAKE 1 TABLET BY MOUTH EVERY DAY 30 tablet 4  . DILTIAZEM HCL ER BEADS 240 MG  PO CP24 Oral Take 1 capsule (240 mg total) by mouth daily. 30 capsule 11  . FLUTICASONE PROPIONATE 50 MCG/ACT NA SUSP Nasal 1 spray by Nasal route 2 (two) times daily. 16 g 11  . FUROSEMIDE 40 MG PO TABS Oral Take 1 tablet (40 mg total) by mouth daily. 30 tablet 11  . RANITIDINE HCL 150 MG PO TABS  TAKE 1 TABLET BY MOUTH TWICE A DAY FOR STOMACH 60 tablet 5  . TRIAMCINOLONE ACETONIDE 0.1 % EX CREA Topical Apply topically 2 (two) times daily. 30 g 0  . ZOLPIDEM TARTRATE 10 MG PO TABS Oral Take 10 mg by mouth at bedtime as needed.        BP 186/74  Pulse 76  Temp(Src) 98.2 F (36.8 C) (Oral)  Resp 14  SpO2 98%  Physical Exam  Nursing note and vitals reviewed. Constitutional: She is oriented to person, place, and time. She appears  well-developed and well-nourished.  HENT:  Head: Normocephalic and atraumatic.  Eyes: EOM are normal. Pupils are equal, round, and reactive to light.  Neck: Normal range of motion. Neck supple.  Cardiovascular: Normal rate, regular rhythm and normal heart sounds.   No murmur heard. Pulmonary/Chest: Effort normal and breath sounds normal. No respiratory distress. She has no wheezes. She has no rales.  Abdominal: Soft. Bowel sounds are normal. She exhibits no distension. There is no tenderness. There is no rebound and no guarding.  Musculoskeletal: Normal range of motion.  Neurological: She is alert and oriented to person, place, and time. No cranial nerve deficit.  Skin: Skin is warm and dry.  Psychiatric: She has a normal mood and affect. Her speech is normal.    ED Course  Procedures (including critical care time)  Labs Reviewed  DIFFERENTIAL - Abnormal; Notable for the following:    Neutrophils Relative 87 (*)    Neutro Abs 9.1 (*)    Lymphocytes Relative 7 (*)    All other components within normal limits  CBC  URINALYSIS, ROUTINE W REFLEX MICROSCOPIC   Dg Chest 2 View  03/06/2012  *RADIOLOGY REPORT*  Clinical Data: Low back pain, syncope  CHEST - 2 VIEW  Comparison: Plain film 02/27 1009  Findings: Stable enlarged heart silhouette.  There is chronic interstitial lung disease with upper lobe scarring which is progressed compared to prior.  The consolidation.  Pneumothorax. Degenerative osteophytosis of the thoracic spine.  IMPRESSION: Chronic interstitial lung disease with upper lobe scarring.  Original Report Authenticated By: Genevive Bi, M.D.     1. Syncope   2. Non-STEMI (non-ST elevated myocardial infarction)      Date: 03/06/2012  Rate: 84  Rhythm: normal sinus rhythm  QRS Axis: normal  Intervals: normal  ST/T Wave abnormalities: normal  Conduction Disutrbances:left bundle branch block  Narrative Interpretation: Watching the monitor strip, patient will go in and  out of her left bundle branch block  Old EKG Reviewed: unchanged    MDM  Patient was sudden syncope yesterday. No chest pain. She has a persistent left bundle branch block on her EKG. Her initial troponin came back at 4.9. She is hypertensive here. I discussed with medicine for admission. I discussed with cardiology for consult, but they wanted to hear from medicine.        Juliet Rude. Rubin Payor, MD 03/06/12 1534

## 2012-03-07 ENCOUNTER — Inpatient Hospital Stay (HOSPITAL_COMMUNITY): Payer: Medicare Other

## 2012-03-07 DIAGNOSIS — I10 Essential (primary) hypertension: Secondary | ICD-10-CM

## 2012-03-07 LAB — BASIC METABOLIC PANEL
CO2: 29 mEq/L (ref 19–32)
Calcium: 8.7 mg/dL (ref 8.4–10.5)
Chloride: 100 mEq/L (ref 96–112)
Creatinine, Ser: 1.12 mg/dL — ABNORMAL HIGH (ref 0.50–1.10)
GFR calc Af Amer: 47 mL/min — ABNORMAL LOW (ref >90)
Sodium: 140 mEq/L (ref 135–145)

## 2012-03-07 LAB — CARDIAC PANEL(CRET KIN+CKTOT+MB+TROPI)
CK, MB: 9.6 ng/mL (ref 0.3–4.0)
CK, MB: 7.8 ng/mL (ref 0.3–4.0)
Relative Index: 5.3 — ABNORMAL HIGH (ref 0.0–2.5)
Total CK: 199 U/L — ABNORMAL HIGH (ref 7–177)
Total CK: 148 U/L (ref 7–177)

## 2012-03-07 LAB — PRO B NATRIURETIC PEPTIDE: Pro B Natriuretic peptide (BNP): 12539 pg/mL — ABNORMAL HIGH (ref 0–450)

## 2012-03-07 MED ORDER — ASPIRIN 325 MG PO TABS
325.0000 mg | ORAL_TABLET | Freq: Every day | ORAL | Status: DC
  Administered 2012-03-07 – 2012-03-11 (×5): 325 mg via ORAL
  Filled 2012-03-07 (×5): qty 1

## 2012-03-07 MED ORDER — LABETALOL HCL 5 MG/ML IV SOLN
10.0000 mg | INTRAVENOUS | Status: DC | PRN
  Administered 2012-03-07: 10 mg via INTRAVENOUS
  Filled 2012-03-07: qty 4

## 2012-03-07 NOTE — Progress Notes (Signed)
Name: [name] MRN: [id number] DOB: 1915-02-27    LOS: 1  PCCM Progress NOTE  PCP : [facility] History of Present Illness: 78 y.o. female adm 3/17 for acute pulm edema , hypertensive crisis & NSTEMI. History as obtained from ED - Pt s/p syncopal episode at 0645 this am. Has an extensive medical history, including A. fib, MI x 2, HTN on cardizem & aten. States she was walking and syncopized without warning. Does not recall hitting her head. No tunnel vision, nausea, diaphoresis, palpitations, CP, SOB preceding fall. Is currently c/o lower back pain, and states she vomited once this morning. Currently no headache, visual changes, neck pain, chest pain,, shortness of breath, abdominal pain. No amnesia, dysarthria, focal weakness. Patient has been otherwise in her usual state of health prior to the fall. satn 92% RA, EKG- LBBB, similar to 2009. Developed increased hypoxia, daughter was called & agreed to DNR status - PCCM called to admit due to tenuous status.morphine & IV NTG given with some improvement. BP 247/83 recorded highest now down to 157/70  Events: Improved resp status and pcxr, neg balance  Vital Signs: Temp:  [97.4 F (36.3 C)-98.9 F (37.2 C)] 97.4 F (36.3 C) (03/18 1629) Pulse Rate:  [65-127] 91  (03/18 1629) Resp:  [15-37] 26  (03/18 1629) BP: (96-247)/(30-114) 190/55 mmHg (03/18 1600) SpO2:  [71 %-98 %] 95 % (03/18 1629) Weight:  [61.1 kg (134 lb 11.2 oz)-62.551 kg (137 lb 14.4 oz)] 62.551 kg (137 lb 14.4 oz) (03/18 0432) I/O last 3 completed shifts: In: 66.6 [P.O.:60; I.V.:6.6] Out: 1260 [Urine:1260]  Physical Examination: General: awake, follows commands Neuro: intact HEENT: jvd PULM: crackles, exp wheeze mild CV: s1 s2 RRR no ,  GI": abdo soft, BS wnl no r Extremities: edema mild  Ventilator settings:none    Labs and Imaging:  Reviewed.  Please refer to the Assessment and Plan section for relevant results.  Assessment and Plan:  Patient Active Hospital  Problem List: Acute pulmonary edema (03/06/2012)   Assessment: induced by htn crisis Plan:  lasix received has responded well pcxr to follow also appears well Would hol dlasix with crt rise as clinically improved maintsay control BP  Acute respiratory failure (03/06/2012)   Assessment: related to above   Plan: DNR noted, no further distress abg resolved Neg  Balance has benefits pcxr to follow uniliateral nature rt ? May need addition neb  Hypertensive crisis (03/06/2012)   Assessment:induced by pain ?   Plan:  IV nTG drip for edema and preload, labetolol more aggressive needed May need drip MAp goal 105  Myocardial infarction acute (03/06/2012)   Assessment: related to above, LBBB is chronic   Plan: ASA, re eval home meds  Syncope - control BP May need echo as she improves  ARF Lasix hold, see pulm Chem in am   Best practices / Disposition: -->SDU status under PCCM -->DNR --> SCds  for DVT Px -->Protonix for GI Px _-> npo for now -->daughter updated on the phone  Clark Clowdus J. 03/07/2012, 4:32 PM

## 2012-03-08 ENCOUNTER — Inpatient Hospital Stay (HOSPITAL_COMMUNITY): Payer: Medicare Other

## 2012-03-08 DIAGNOSIS — J9600 Acute respiratory failure, unspecified whether with hypoxia or hypercapnia: Secondary | ICD-10-CM

## 2012-03-08 DIAGNOSIS — I509 Heart failure, unspecified: Secondary | ICD-10-CM

## 2012-03-08 DIAGNOSIS — I4891 Unspecified atrial fibrillation: Secondary | ICD-10-CM

## 2012-03-08 LAB — BASIC METABOLIC PANEL
Calcium: 8.5 mg/dL (ref 8.4–10.5)
GFR calc Af Amer: 69 mL/min — ABNORMAL LOW (ref >90)
GFR calc non Af Amer: 59 mL/min — ABNORMAL LOW (ref >90)
Glucose, Bld: 103 mg/dL — ABNORMAL HIGH (ref 70–99)
Potassium: 3.3 mEq/L — ABNORMAL LOW (ref 3.5–5.1)
Sodium: 138 mEq/L (ref 135–145)

## 2012-03-08 MED ORDER — DEXTROSE 5 % IV SOLN
1.0000 g | INTRAVENOUS | Status: DC
  Administered 2012-03-08: 1 g via INTRAVENOUS
  Filled 2012-03-08 (×2): qty 10

## 2012-03-08 MED ORDER — CILOSTAZOL 100 MG PO TABS
100.0000 mg | ORAL_TABLET | Freq: Two times a day (BID) | ORAL | Status: DC
  Administered 2012-03-08 – 2012-03-11 (×7): 100 mg via ORAL
  Filled 2012-03-08 (×8): qty 1

## 2012-03-08 MED ORDER — CLOPIDOGREL BISULFATE 75 MG PO TABS
75.0000 mg | ORAL_TABLET | Freq: Every day | ORAL | Status: DC
  Administered 2012-03-08 – 2012-03-11 (×4): 75 mg via ORAL
  Filled 2012-03-08 (×4): qty 1

## 2012-03-08 MED ORDER — FUROSEMIDE 10 MG/ML IJ SOLN
40.0000 mg | Freq: Every day | INTRAMUSCULAR | Status: AC
  Administered 2012-03-08 – 2012-03-11 (×4): 40 mg via INTRAVENOUS
  Filled 2012-03-08 (×4): qty 4

## 2012-03-08 MED ORDER — DILTIAZEM HCL ER BEADS 240 MG PO CP24
240.0000 mg | ORAL_CAPSULE | Freq: Every day | ORAL | Status: DC
  Administered 2012-03-08 – 2012-03-11 (×4): 240 mg via ORAL
  Filled 2012-03-08 (×4): qty 1

## 2012-03-08 MED ORDER — ATENOLOL 100 MG PO TABS
100.0000 mg | ORAL_TABLET | Freq: Every day | ORAL | Status: DC
  Administered 2012-03-08 – 2012-03-11 (×4): 100 mg via ORAL
  Filled 2012-03-08 (×4): qty 1

## 2012-03-08 NOTE — Progress Notes (Addendum)
Name: [name] MRN: [id number] DOB: [birth year]    LOS: 2  PCCM Progress NOTE  PCP : [facility] History of Present Illness: 78 y.o. female adm 3/17 for acute pulm edema , hypertensive crisis & NSTEMI. History as obtained from ED - Pt s/p syncopal episode at 0645 this am. Has an extensive medical history, including A. fib, MI x 2, HTN on cardizem & aten. States she was walking and syncopized without warning. Does not recall hitting her head. No tunnel vision, nausea, diaphoresis, palpitations, CP, SOB preceding fall. Is currently c/o lower back pain, and states she vomited once this morning. Currently no headache, visual changes, neck pain, chest pain,, shortness of breath, abdominal pain. No amnesia, dysarthria, focal weakness. Patient has been otherwise in her usual state of health prior to the fall. satn 92% RA, EKG- LBBB, similar to 2009. Developed increased hypoxia, daughter was called & agreed to DNR status - PCCM called to admit due to tenuous status.morphine & IV NTG given with some improvement. BP 247/83 recorded highest now down to 157/70  Events: 3/18 Improved resp status and pcxr, neg balance 3/19 Improved but still HTN, secretions?  Vital Signs: Temp:  [97.4 F (36.3 C)-99 F (37.2 C)] 97.6 F (36.4 C) (03/19 0803) Pulse Rate:  [74-96] 90  (03/19 0803) Resp:  [19-38] 28  (03/19 0803) BP: (98-213)/(30-108) 199/57 mmHg (03/19 0803) SpO2:  [83 %-99 %] 96 % (03/19 0803) Weight:  [63 kg (138 lb 14.2 oz)] 63 kg (138 lb 14.2 oz) (03/19 0300) I/O last 3 completed shifts: In: 1037.8 [P.O.:378; I.V.:659.8] Out: 1620 [Urine:1620]  Physical Examination: General: awake, follows commands Neuro: intact, unchanged HEENT: jvd PULM: crackles, rt greater left CV: s1 s2 RRR no ,  GI": abdo soft, BS wnl no r Extremities: edema mild unchanged  Ventilator settings:none    Labs and Imaging:  Reviewed.  Please refer to the Assessment and Plan section for relevant results.  Assessment  and Plan:  Patient Active Hospital Problem List: Acute pulmonary edema (03/06/2012)   Assessment: induced by htn crisis Plan:  lasix received has responded well Goal to dc Nitro, add home regimen May need imdur, follow BP resposnse Re add low dose lasix qday as crt improved  Acute respiratory failure (03/06/2012)   Assessment: related to above   Plan: DNR noted, no further distress Goal neg concner pna, add ceftraixone Sputum assessment  Hypertensive crisis (03/06/2012)   Assessment:induced by pain ?   Plan:  IV nTG drip dc Add home dilt, atenolol, home Dc NTG drip labetolol prn Dc tele  Myocardial infarction acute (03/06/2012)   Assessment: related to above, LBBB is chronic   Plan: ASA, re eval home meds, add plavix,   Syncope - control BP May need echo as she improves  ARF Lasix restart  crt improved  Best practices / Disposition: -->SDU status under PCCM -->DNR --> SCds  for DVT Px -->Protonix for GI Px _-> full liq, get swallow, rt base infiltrate? asp -->daughter updated on the phone  Nelda Bucks. 03/08/2012, 10:56 AM   To floor, will call Dr Arthur Holms to take over if [facility]

## 2012-03-08 NOTE — Progress Notes (Signed)
COURTESY NOTE [name] is well known to me. Chart reviewed - admitted after syncopal episode. Found to have fluid overload, RBBB and acute respiratory failure. This AM she is awake and alert and in no distress. She does admit to a productive cough - sputum mostly clear.   Filed Vitals:   03/08/12 0500  BP: 176/64  Pulse: 74  Temp:   Resp: 19  Na 138, K 3.3, BUN 26, C4. 0.82 pBNP was 17000+  CXR 3/18: IMPRESSION:  1. Multifocal interstitial and airspace disease throughout the  lungs bilaterally (right greater than left). The appearance is  relatively asymmetric, and is therefore favored to represent  multilobar pneumonia. At this time, there is only mild pulmonary  venous congestion (therefore not favored to reflect pulmonary  edema).  2. Small bilateral pleural effusions (left greater than right).  3. Atherosclerosis.  Original Report Authenticated By: Florencia Reasons, M.D.  At this point fluid status seems improved. CBC with normal WBC but left shift noted.   1. CHF - seems improved 2. Acute Respiratory failure - seems to be doing OK - no increased WOB. 3. HTN - better control 4. Cardiac - no distress, hemodynamically stable 5. ARF- creatinine improved and in normal range 6. ID - no fever, WBC normal with left shift and suspicious for PNA on CXR. ?antibiotics.  Will follow socially and will be happy to assist once stable from CCM perspective.

## 2012-03-08 NOTE — Progress Notes (Signed)
UR Completed.  Chantal Worthey Jane [phone] 03/08/2012  

## 2012-03-09 ENCOUNTER — Inpatient Hospital Stay (HOSPITAL_COMMUNITY): Payer: Medicare Other

## 2012-03-09 DIAGNOSIS — J810 Acute pulmonary edema: Secondary | ICD-10-CM

## 2012-03-09 DIAGNOSIS — I10 Essential (primary) hypertension: Secondary | ICD-10-CM

## 2012-03-09 DIAGNOSIS — J13 Pneumonia due to Streptococcus pneumoniae: Secondary | ICD-10-CM

## 2012-03-09 LAB — BASIC METABOLIC PANEL
CO2: 31 mEq/L (ref 19–32)
Calcium: 8.5 mg/dL (ref 8.4–10.5)
Chloride: 98 mEq/L (ref 96–112)
Creatinine, Ser: 0.67 mg/dL (ref 0.50–1.10)
Glucose, Bld: 109 mg/dL — ABNORMAL HIGH (ref 70–99)

## 2012-03-09 LAB — MAGNESIUM: Magnesium: 1.8 mg/dL (ref 1.5–2.5)

## 2012-03-09 MED ORDER — MOXIFLOXACIN HCL 400 MG PO TABS
400.0000 mg | ORAL_TABLET | Freq: Every day | ORAL | Status: DC
  Administered 2012-03-10: 400 mg via ORAL
  Filled 2012-03-09 (×3): qty 1

## 2012-03-09 NOTE — Progress Notes (Signed)
Name: [name] MRN: [id number] DOB: October 21, 1915    LOS: 3  PCCM Progress NOTE  PCP : [facility]ins History of Present Illness: 78 y.o. female adm 3/17 for acute pulm edema , hypertensive crisis & NSTEMI. History as obtained from ED - Pt s/p syncopal episode at 0645 this am. Has an extensive medical history, including A. fib, MI x 2, HTN on cardizem & aten. States she was walking and syncopized without warning. Does not recall hitting her head. No tunnel vision, nausea, diaphoresis, palpitations, CP, SOB preceding fall. Is currently c/o lower back pain, and states she vomited once this morning. Currently no headache, visual changes, neck pain, chest pain,, shortness of breath, abdominal pain. No amnesia, dysarthria, focal weakness. Patient has been otherwise in her usual state of health prior to the fall. satn 92% RA, EKG- LBBB, similar to 2009. Developed increased hypoxia, daughter was called & agreed to DNR status - PCCM called to admit due to tenuous status.morphine & IV NTG given with some improvement. BP 247/83 recorded highest now down to 157/70  Events: 3/18 Improved resp status and pcxr, neg balance 3/19 Improved but still HTN, secretions?  Vital Signs: Temp:  [98.4 F (36.9 C)-100.1 F (37.8 C)] 100.1 F (37.8 C) (03/20 0539) Pulse Rate:  [67-90] 82  (03/20 0539) Resp:  [21-29] 22  (03/20 0539) BP: (144-199)/(37-103) 153/68 mmHg (03/20 0539) SpO2:  [92 %-99 %] 93 % (03/20 0539) Weight:  [64.5 kg (142 lb 3.2 oz)] 64.5 kg (142 lb 3.2 oz) (03/20 0539) I/O last 3 completed shifts: In: 938 [P.O.:440; I.V.:448; IV Piggyback:50] Out: 1450 [Urine:1450]  Physical Examination:  Gen: awake and alert, conversant HEENT: NCAT, OP clear Pulm: crackles in bases CV; RRR, systolic murmur noted, JVD Ab: BS+, soft, nt/nd Ext: warm, no clubbing  Ventilator settings:none    Labs and Imaging:   CBC    Component Value Date/Time   WBC 10.4 03/06/2012 1415   RBC 5.10 03/06/2012 1415   HGB  15.0 03/06/2012 1506   HCT 44.0 03/06/2012 1506   PLT 182 03/06/2012 1415   MCV 85.9 03/06/2012 1415   MCH 28.0 03/06/2012 1415   MCHC 32.6 03/06/2012 1415   RDW 13.5 03/06/2012 1415   LYMPHSABS 0.8 03/06/2012 1415   MONOABS 0.5 03/06/2012 1415   EOSABS 0.1 03/06/2012 1415   BASOSABS 0.0 03/06/2012 1415    BMET    Component Value Date/Time   NA 137 03/09/2012 0522   K 3.6 03/09/2012 0522   CL 98 03/09/2012 0522   CO2 31 03/09/2012 0522   GLUCOSE 109* 03/09/2012 0522   BUN 18 03/09/2012 0522   CREATININE 0.67 03/09/2012 0522   CALCIUM 8.5 03/09/2012 0522   GFRNONAA 73* 03/09/2012 0522   GFRAA 85* 03/09/2012 0522     Assessment and Plan:  Patient Active Hospital Problem List: Acute pulmonary edema (03/06/2012)   Assessment: induced by htn crisis; improving Plan: Continue lasix daily Continue current BP regimen, defer to cardiology for further adjustments  Acute respiratory failure (03/06/2012)   Assessment: related to above   Plan: DNR noted, no further distress Goal neg Possible pneumonia, but most likely resp failure was from pulm edema Change to moxifloxacin oral for 7 day  Hypertensive crisis (03/06/2012)   Assessment:induced by pain ?, improved   Plan: home dilt, atenolol labetolol prn Per cardiology  Myocardial infarction acute (03/06/2012)   Assessment: related to above, LBBB is chronic   Plan: ASA, re eval home meds, add plavix,   Syncope -  control BP May need echo as she improves  ARF Lasix daily crt improved  Best practices / Disposition: -->floor status, transfer to Dr. Scarlett Presto service today -->DNR --> SCds  for DVT Px -->Protonix for GI Px _-> full liq, get swallow, rt base infiltrate? asp  Halen Antenucci 03/09/2012, 10:28 AM   Have discussed with Dr. Arthur Holms who agrees to take over care.  Will see as consult in AM 3/21

## 2012-03-09 NOTE — Evaluation (Signed)
Physical Therapy Evaluation Patient Details Name: [name] MRN: [id number] DOB: 1915-05-03 Today's Date: 03/09/2012  Problem List:  Patient Active Problem List  Diagnoses  . HYPERLIPIDEMIA  . ANEMIA-NOS  . ANXIETY  . HYPERTENSION  . CORONARY ARTERY DISEASE  . ATRIAL FIBRILLATION  . CONGESTIVE HEART FAILURE  . PERIPHERAL VASCULAR DISEASE  . ALLERGIC RHINITIS  . GERD  . DIVERTICULOSIS, COLON  . FOOT PAIN, RIGHT  . OSTEOPOROSIS  . DISEQUILIBRIUM  . INSOMNIA  . HIP FRACTURE, RIGHT  . Atopic eczema  . Acute pulmonary edema  . Acute respiratory failure  . Hypertensive crisis  . Myocardial infarction acute    Past Medical History:  Past Medical History  Diagnosis Date  . CAD (coronary artery disease)   . Hyperlipidemia   . Osteoporosis   . PVD (peripheral vascular disease)   . HTN (hypertension)   . Diverticulosis of colon   . GERD (gastroesophageal reflux disease)   . Hip fracture, right   . Decubitus ulcer     Right heel  . COPD (chronic obstructive pulmonary disease)   . Anxiety   . Anemia   . Esophageal stricture   . CHF (congestive heart failure)   . Atrial fibrillation   . Fibromuscular dysplasia     Renal artery  . Myocardial infarction    Past Surgical History:  Past Surgical History  Procedure Date  . Ptca 04/2003  . Cataract extraction     Bilateral  . Orif acetabular fracture 2009    PT Assessment/Plan/Recommendation PT Assessment Clinical Impression Statement: Pt is a 78 y.o. female s/p a syncopatic episode and pulmonary edema with resulting general weakness and immobility.  Pt will benefit from skilled physical therapy in the acute setting to improve her mobility, balance, and safety awareness. PT Recommendation/Assessment: Patient will need skilled PT in the acute care venue PT Problem List: Decreased strength;Decreased activity tolerance;Decreased balance;Decreased mobility;Decreased coordination;Decreased cognition;Decreased knowledge of  use of DME;Decreased safety awareness Barriers to Discharge: None PT Therapy Diagnosis : Difficulty walking;Abnormality of gait;Generalized weakness PT Plan PT Frequency: Min 3X/week PT Treatment/Interventions: DME instruction;Gait training;Stair training;Functional mobility training;Therapeutic activities;Balance training;Neuromuscular re-education;Patient/family education;Cognitive remediation PT Recommendation Recommendations for Other Services: OT consult Follow Up Recommendations: Home health PT Equipment Recommended: 3 in 1 bedside comode PT Goals  Acute Rehab PT Goals PT Goal Formulation: With patient Time For Goal Achievement: 7 days Pt will Roll Supine to Right Side: Independently;with rail PT Goal: Rolling Supine to Right Side - Progress: Goal set today Pt will go Supine/Side to Sit: with modified independence;with rail PT Goal: Supine/Side to Sit - Progress: Goal set today Pt will go Sit to Supine/Side: with modified independence;with HOB 0 degrees;with rail PT Goal: Sit to Supine/Side - Progress: Goal set today Pt will go Sit to Stand: with supervision PT Goal: Sit to Stand - Progress: Goal set today Pt will go Stand to Sit: with supervision PT Goal: Stand to Sit - Progress: Goal set today Pt will Ambulate: 16 - 50 feet;with rolling walker (min guard) PT Goal: Ambulate - Progress: Goal set today  PT Evaluation Precautions/Restrictions    Prior Functioning  Home Living Lives With: Other (Comment) (grandson) Receives Help From: Family Type of Home: House Home Layout: One level Home Access: Stairs to enter Entrance Stairs-Rails: Right Entrance Stairs-Number of Steps: 1 Bathroom Shower/Tub: Health visitor: Standard Home Adaptive Equipment: Walker - rolling;Bedside commode/3-in-1 Prior Function Level of Independence: Requires assistive device for independence;Independent with transfers;Independent with basic ADLs Able  to Take Stairs?: Yes Driving:  No Vocation: Retired Producer, television/film/video: Awake/alert Overall Cognitive Status: Impaired Orientation Level: Oriented X4 Sensation/Coordination Sensation Light Touch: Not tested Coordination Gross Motor Movements are Fluid and Coordinated: Yes Extremity Assessment RLE Assessment RLE Assessment: Exceptions to [facility] RLE Strength RLE Overall Strength: Deficits RLE Overall Strength Comments: Patient displays 3+/5 general RLE strength with functional activities. LLE Assessment LLE Assessment: Exceptions to [facility] LLE Strength LLE Overall Strength: Deficits LLE Overall Strength Comments: Patient displays 3+/5 general RLE strength with functional activities. Mobility (including Balance) Bed Mobility Bed Mobility: Yes Rolling Right: Other (comment);With rail (Min guard) Right Sidelying to Sit: 4: Min assist Right Sidelying to Sit Details (indicate cue type and reason): Min assist to assist with trunk and hip movement. Sitting - Scoot to Edge of Bed: 4: Min assist Sitting - Scoot to [place] of Bed Details (indicate cue type and reason): Min assist required from the hips to initiate scooting. Transfers Transfers: Yes Sit to Stand: 4: Min assist;With upper extremity assist;From bed Sit to Stand Details (indicate cue type and reason): Pt required min assist and max verbal cues for hand placement with RW. Stand to Sit: 4: Min assist;To chair/3-in-1;With armrests;With upper extremity assist Stand to Sit Details: Pt required min assist to control descent and max VC for hand placement. Ambulation/Gait Ambulation/Gait: Yes Ambulation/Gait Assistance: 4: Min assist Ambulation/Gait Assistance Details (indicate cue type and reason): Pt required min assist for safety and max VC and manual cues for hand placement with RW. Ambulation Distance (Feet): 20 Feet Assistive device: Rolling walker Gait Pattern: Step-to pattern;Decreased stride length Gait velocity: Decreased Stairs: No    Posture/Postural Control Posture/Postural Control: No significant limitations Balance Balance Assessed: Yes Static Standing Balance Static Standing - Balance Support: No upper extremity supported Static Standing - Level of Assistance: 5: Stand by assistance Static Standing - Comment/# of Minutes: 6 minutes Exercise    End of Session PT - End of Session Equipment Utilized During Treatment: Gait belt Activity Tolerance: Patient limited by fatigue Patient left: in chair;with call bell in reach General Behavior During Session: [facility] for tasks performed Cognition: Impaired (Unable to determined if impaired at baseline.)  Ezzard Standing SPT 03/09/2012, 4:08 PM

## 2012-03-09 NOTE — Progress Notes (Signed)
Subjective: Patient was sleeping and respirations were a little labored. Awakened easily. C/o room is too small and she wants to go home.   Objective: Lab: BMET    Component Value Date/Time   NA 137 03/09/2012 0522   K 3.6 03/09/2012 0522   CL 98 03/09/2012 0522   CO2 31 03/09/2012 0522   GLUCOSE 109* 03/09/2012 0522   BUN 18 03/09/2012 0522   CREATININE 0.67 03/09/2012 0522   CALCIUM 8.5 03/09/2012 0522   GFRNONAA 73* 03/09/2012 0522   GFRAA 85* 03/09/2012 0522   Cardiac Panel (last 3 results)  Basename 03/07/12 0551 03/06/12 2319  CKTOTAL 148 199*  CKMB 7.8* 9.6*  TROPONINI 4.41* 3.69*  RELINDX 5.3* 4.8*      Imaging: CXR IMPRESSION:  No significant interval change in bilateral heterogeneous air space  opacities, right greater than left, worrisome for multifocal  infection.  Original Report Authenticated By: Waynard Reeds, M.D.    Physical Exam: Filed Vitals:   03/09/12 1511  BP: 113/51  Pulse: 68  Temp: 97.2 F (36.2 C)  Resp: 20   Wt Readings from Last 3 Encounters:  03/09/12 142 lb 3.2 oz (64.5 kg)  02/17/12 141 lb 2 oz (64.014 kg)  10/22/11 145 lb (65.772 kg)    Intake/Output Summary (Last 24 hours) at 03/09/12 1913 Last data filed at 03/09/12 1700  Gross per 24 hour  Intake    720 ml  Output      0 ml  Net    720 ml    Gen'l very elderly white woman who is looking haggard and she has lost weight Cor - 2+ radial, RRR PUlm - coarse rhonchi at the left base, less so at the right base. No wheezing. Question of feint raels Neuro - A&O x 3     Assessment/Plan: 1. CHF  - patient with continued diuresis but still sounds a little wet. Plan - continue present dose of lasix. 2. Acute respiratory failure - seems much better. MIld increased WOB 3. HTN - BP well controlled 4. Cardiac - Trop I remains elevated - non cardiac cause?  Plan - repeat EKG           2 D echo 5. ARF- resolved 6. ID - x-ray suspicious but no other signs of infection Plan -  continue avelox           CBCD  [name] 03/09/2012, 7:08 PM

## 2012-03-09 NOTE — Evaluation (Signed)
Agree with student PT evaluation.  Lauralyn Shadowens, PT DPT [phone]  

## 2012-03-10 DIAGNOSIS — I359 Nonrheumatic aortic valve disorder, unspecified: Secondary | ICD-10-CM

## 2012-03-10 DIAGNOSIS — N3000 Acute cystitis without hematuria: Secondary | ICD-10-CM

## 2012-03-10 DIAGNOSIS — E870 Hyperosmolality and hypernatremia: Secondary | ICD-10-CM

## 2012-03-10 DIAGNOSIS — F05 Delirium due to known physiological condition: Secondary | ICD-10-CM

## 2012-03-10 LAB — DIFFERENTIAL
Basophils Absolute: 0 10*3/uL (ref 0.0–0.1)
Eosinophils Relative: 5 % (ref 0–5)
Lymphocytes Relative: 16 % (ref 12–46)
Lymphs Abs: 1.2 10*3/uL (ref 0.7–4.0)
Monocytes Absolute: 0.5 10*3/uL (ref 0.1–1.0)
Monocytes Relative: 6 % (ref 3–12)
Neutro Abs: 5.6 10*3/uL (ref 1.7–7.7)

## 2012-03-10 LAB — BASIC METABOLIC PANEL
CO2: 34 mEq/L — ABNORMAL HIGH (ref 19–32)
Chloride: 95 mEq/L — ABNORMAL LOW (ref 96–112)
Creatinine, Ser: 0.73 mg/dL (ref 0.50–1.10)
Potassium: 3 mEq/L — ABNORMAL LOW (ref 3.5–5.1)

## 2012-03-10 LAB — CBC
HCT: 42.3 % (ref 36.0–46.0)
Hemoglobin: 14.1 g/dL (ref 12.0–15.0)
MCV: 85.1 fL (ref 78.0–100.0)
RDW: 13.1 % (ref 11.5–15.5)
WBC: 7.6 10*3/uL (ref 4.0–10.5)

## 2012-03-10 MED ORDER — MOXIFLOXACIN HCL 400 MG PO TABS: 400.0000 mg | ORAL_TABLET | Freq: Every day | ORAL | Status: AC

## 2012-03-10 NOTE — Progress Notes (Signed)
  Echocardiogram 2D Echocardiogram has been performed.  [name] [facility] 03/10/2012, 10:40 AM

## 2012-03-10 NOTE — Progress Notes (Signed)
PM note  Patient had 2 D echo - good lv function. Oxygen sat on RA at rest and with ambulation pending - may need order for home O2 if low In anticipation of D/C in AM d/c summary done, d/c orders done, home health ordered  Dictation [id number]  Available on cell 0400-0600; 1100-1700  3/22

## 2012-03-10 NOTE — Progress Notes (Signed)
Subjective: Patient is awake and in no distress. No complaints  Objective: Lab: Lab Results  Component Value Date   WBC 7.6 03/10/2012   HGB 14.1 03/10/2012   HCT 42.3 03/10/2012   MCV 85.1 03/10/2012   PLT 205 03/10/2012  nl diff with 73% segs   Imaging:no new imaging  Physical Exam: Filed Vitals:   03/10/12 0600  BP: 145/62  Pulse: 85  Temp: 97.2 F (36.2 C)  Resp: 18   Wt Readings from Last 3 Encounters:  03/10/12 145 lb 15.1 oz (66.2 kg)  02/17/12 141 lb 2 oz (64.014 kg)  10/22/11 145 lb (65.772 kg)   Total I/O -906  Pulm - no increased WOB, no wheezing Cor - RRR      Assessment/Plan: 1. CHF - almost 1 liter out. NO respiratory distress.  Plan - for Echo today           Oxygen saturation on room air and with ambulation  2. Acute respiratory failure - resolved  3. HTN - controlled  4. Cardiac - Echo for today  5. ARF - resolved  6. ID #2/2 rocephin, #2/7 avelox. WBC normal with a normal diff - argues against active infectin.   [name] 03/10/2012, 6:34 AM

## 2012-03-10 NOTE — Progress Notes (Signed)
30 minutes after O2 removed Pts O2 sat was 90%, and after walking to the bathroom her O2 sat was 92%. Will cont to monitor O2 sats. Deliah Goody 03/10/2012 9:25 PM

## 2012-03-10 NOTE — Discharge Summary (Signed)
NAMEAMORA, [name]                 ACCOUNT NO.:  [id number]  MEDICAL RECORD NO.:  [id number]  LOCATION:  3021                         FACILITY:  [facility]  PHYSICIAN:  Rosalyn Gess. Kortnie Stovall, MD  DATE OF BIRTH:  04-30-1915  DATE OF ADMISSION:  03/06/2012 DATE OF DISCHARGE:  03/11/2012                              DISCHARGE SUMMARY   ADMITTING DIAGNOSES: 1. Syncope. 2. Respiratory distress with hypoxemia. 3. Hypertensive crisis, treated with IV nitroglycerin. 4. Cardiovascular with elevated troponin. 5. Urinary tract infection. 6. Question of multilobar pneumonia.  DISCHARGE DIAGNOSES: 1. Syncope. 2. Respiratory distress with hypoxemia. 3. Hypertensive crisis, treated with IV nitroglycerin. 4. Cardiovascular with elevated troponin. 5. Urinary tract infection. 6. Question of multilobar pneumonia.  CONSULTANTS:  The patient admitted by Dr. Vassie Loll for Critical Care Medicine and followed by Critical Care and Pulmonary Medicine, through March 09, 2012.  HISTORY OF PRESENT ILLNESS:  [name] is 78, but was 78 year old at the time of admission.  She had been at home, on the way to the bathroom she had an unwitnessed syncopal episode.  Because of her syncopal episode, she was taken to urgent care for [facility] and was found to have significant hypoxemia with some respiratory distress and was transferred to [facility].  She was evaluated in the emergency department and was found to have episodes of respiratory distress with a drop in her oxygen saturation into the 80s accompanied by hyperventilation.  The patient was seen by critical care due to the patient's low O2 sat and respiratory distress.  Intubation was being considered.  The patient's family confirmed her DNR, do not intubate status, and therefore she was given supportive care without intubation, but was admitted to the ICU. Question whether the patient had a myocardial event given that she had mildly elevated troponin, but evidently  normal EKG.  She was noted to have a right bundle-branch block.  It was unchanged from a previous study in 2009. Please see the multiple admission notes for past medical history, family history, and social history, which is also well documented in her epic record.  HOSPITAL COURSE: 1. Acute pulmonary edema with acute respiratory failure.  The patient     was thought to have had a event related to hypertension.  She was     started on IV nitroglycerin and was given p.r.n. IV labetalol in     the intensive care unit.  She also was diuresed.  Chest x-rays were     followed, and the patient did have evidence of multilobar     involvement, which was read by Radiology as representing possible     multilobar pneumonia, although the patient had no significant     elevated white count or fever.  She did have urinary tract     infection.  The patient's respiratory status continued to improve,     although she did continue to require 4 L of oxygen.  On March 08, 2012, she was able to be taken off of IV nitroglycerin, and her     home medications were resumed.  She also was restarted on Plavix.     Acute respiratory issues  were stabilizing, but because of abnormal     chest x-ray as well as a UTI, she was started on ceftriaxone IV.     By March 09, 2012, she was doing much better and was thought to be     stable and able to be transferred out of the intensive care unit to     a regular med/surg bed.  Her blood pressure was responding well to     her home medication.  Her respiratory distress was markedly     improved.  The patient's exam revealed good air movement.  At the     time of this dictation, room air oxygen saturation is pending given     that she had only been on 4 L and need to determine her need for     home oxygen.  Critical Care and Pulmonary Medicine had signed off     as of March 10, 2012. 2. Cardiovascular.  The patient presented with significant     hypertension.  As noted  above, she was initially treated with IV     nitroglycerin and IV labetalol.  She continued to stabilize and was     restarted on her home medications with adequate control of her     blood pressure. The patient never experienced any chest pain or chest discomfort.  She did have abnormal troponin levels with minimally elevated creatine kinase as follows.  Initial CK was 286, next was 199, next was 148; MB fraction was initially 10.8, 9.6, was 7.8.  Troponin I was 4.38, 3.69, and 4.41.  The patient's BNP had a baseline of 580 on March 30, 2011, was 12,539 on March 06, 2012, consistent with acute congestive heart failure.  It is very likely the patient's elevation in the troponin was more related to her respiratory distress problem and congestive heart failure then to acute MI given the normal EKG and telemetry.  Cardiology did not see the patient during this admission.  With her being hemodynamically stable on home medications, it was felt that further cardiovascular workup in a 78 year old who is a DNR is not warranted. The patient did have a 2D echo performed on the day of this dictation, which revealed an ejection fraction of 55-60% with normal systolic function, with moderate LVH.  There is no grading of her diastolic function, but suspect that her acute heart failure was a combination of hypertensive crisis and probably some diastolic dysfunction in a very elderly patient.  Of note, there was no wall motion abnormality reported.  At this point, the patient should be stable for home without additional cardiovascular workup and will continue on her home antihypertensive medications. 3. Syncope.  The patient had no recurrent episodes of syncope or near-     syncope.  Her telemetry was unremarkable with no arrhythmia.  She     had no significant neurologic findings.  No further evaluation is     indicated, and this may have been a vasovagal episode.  No     additional testing is  anticipated. 4. Renal function.  The patient's creatinine was very mildly elevated     soon after admission at 1.1 did return to a baseline of 0.73.  It     was not thought that she had any significant renal impairment.  No     further evaluation is indicated.  5. Deconditioning: the patient was seen by PT and OT during this hospitalization.  She did have some demonstrated weakness and instability.  She had been using a walker at home.  It was felt that she would benefit from home health physical therapy, occupational therapy. The patient also with her recent hypertensive episode and heart failure would benefit from RN nursing visits 3 times a week and the above will be ordered.  PHYSICAL EXAMINATION AT TIME OF THIS DICTATION:  VITAL SIGNS: Temperature was 97.2, blood pressure 145/62, pulse 85, respirations 18. GENERAL APPEARANCE:  This is a very elderly 78 year old woman, who is in no acute distress.  She does have some appearance of weight loss about the face. HEENT:  Unremarkable with conjunctivae and sclerae being clear.  No oral lesions were noted. NECK:  Supple.  There is no thyromegaly. NODES:  No adenopathy was noted in the cervical region. CHEST:  No deformities were appreciated. PULMONARY:  The patient has no increased work of breathing, although she is on 4 L of oxygen.  She has normal breath sounds with good air movement.  No rales, wheezes, or rhonchi are appreciated. CARDIOVASCULAR:  Radial pulse 2+.  Her precordium is quiet.  She does have a regular rate and rhythm. ABDOMEN:  Soft without guarding or rebound. GENITALIA AND PELVIC:  Deferred. RECTAL:  Deferred. EXTREMITIES:  Without deformity or edema. NEUROLOGIC:  The patient is awake, alert.  She is oriented to person, place, time, and context.  Her speech is clear.  She is complaining about the size of the room with the quality of the food.  She moves all extremities to command and seems to be at her baseline.   Marland Kitchen  FINAL LABS AT TIME OF THIS DICTATION:  Metabolic panel from March 10, 2012, sodium 137, potassium 3, chloride is 95, CO2 of 34, BUN of 19, creatinine 0.73, calcium is 9.1, glucose was 130, phosphorus on March 09, 2012, was 2.2, magnesium was 1.8.  Cardiac enzymes as noted.  Last CBC from the day of dictation with a hemoglobin of 14 0.1 g, white count 7600, differential was normal with 73% segs, 16% lymphs, 6% monocytes, 5% eos, platelet count 205,000.  Final chest x-ray from March 09, 2012, with enlarged cardiac silhouette with extensive atherosclerotic calcifications within the thoracic aorta.  Right mid and lower lung and left lower lung with heterogeneous airspace opacities unchanged from prior exams.  There is increased problems of pulmonary interstitium diffusely, was conspicuous within the right upper lung.  No definite pleural effusion or pneumothorax is noted.  DISPOSITION:  The patient will be discharged to home.  Home health will be arranged.  The patient will continue on all her home medications. Need for home oxygen will be determined overnight and will be ready in the a.m., which may be added as an additional order.  The patient will be seen in follow up in the office in within 7 days.  CONDITION AT TIME OF DISCHARGE DICTATION:  Medically stable but guarded given her very advanced age and multiple medical problems. The patient is a DNR.  Thank you very much for your assistance.     Rosalyn Gess Tekeisha Hakim, MD     MEN/MEDQ  D:  03/10/2012  T:  03/10/2012  Job:  [id number]

## 2012-03-10 NOTE — Progress Notes (Signed)
Name: [name] MRN: [id number] DOB: February 27, 1915    LOS: 4  PCCM Progress NOTE  PCP : [facility] History of Present Illness: 78 y.o. female adm 3/17 for acute pulm edema , hypertensive crisis & NSTEMI. History as obtained from ED - Pt s/p syncopal episode at 0645 this am. Has an extensive medical history, including A. fib, MI x 2, HTN on cardizem & aten. States she was walking and syncopized without warning. Does not recall hitting her head. No tunnel vision, nausea, diaphoresis, palpitations, CP, SOB preceding fall. Is currently c/o lower back pain, and states she vomited once this morning. Currently no headache, visual changes, neck pain, chest pain,, shortness of breath, abdominal pain. No amnesia, dysarthria, focal weakness. Patient has been otherwise in her usual state of health prior to the fall. satn 92% RA, EKG- LBBB, similar to 2009. Developed increased hypoxia, daughter was called & agreed to DNR status - PCCM called to admit due to tenuous status.morphine & IV NTG given with some improvement. BP 247/83 recorded highest now down to 157/70  Events: 3/18 Improved resp status and pcxr, neg balance 3/19 Improved but still HTN, secretions? 3/21 Continues to improve, still has sputum production, requires 4 LNC  Vital Signs: Temp:  [97.2 F (36.2 C)-98.8 F (37.1 C)] 98.8 F (37.1 C) (03/21 1001) Pulse Rate:  [63-86] 86  (03/21 1001) Resp:  [18-20] 20  (03/21 1001) BP: (113-151)/(51-75) 151/75 mmHg (03/21 1001) SpO2:  [95 %-97 %] 95 % (03/21 1001) Weight:  [66.2 kg (145 lb 15.1 oz)] 66.2 kg (145 lb 15.1 oz) (03/21 0500) I/O last 3 completed shifts: In: 720 [P.O.:720] Out: 250 [Urine:250]  On 4 L [place]  Physical Examination:  Gen: awake and alert, conversant HEENT: NCAT, OP clear Pulm: crackles in bases (L > R) CV; RRR, systolic murmur noted, JVD Ab: BS+, soft, nt/nd Ext: warm, no clubbing  Ventilator settings:none    Labs and Imaging:   CBC    Component Value Date/Time     WBC 7.6 03/10/2012 0535   RBC 4.97 03/10/2012 0535   HGB 14.1 03/10/2012 0535   HCT 42.3 03/10/2012 0535   PLT 205 03/10/2012 0535   MCV 85.1 03/10/2012 0535   MCH 28.4 03/10/2012 0535   MCHC 33.3 03/10/2012 0535   RDW 13.1 03/10/2012 0535   LYMPHSABS 1.2 03/10/2012 0535   MONOABS 0.5 03/10/2012 0535   EOSABS 0.4 03/10/2012 0535   BASOSABS 0.0 03/10/2012 0535    BMET    Component Value Date/Time   NA 137 03/10/2012 0535   K 3.0* 03/10/2012 0535   CL 95* 03/10/2012 0535   CO2 34* 03/10/2012 0535   GLUCOSE 130* 03/10/2012 0535   BUN 19 03/10/2012 0535   CREATININE 0.73 03/10/2012 0535   CALCIUM 9.1 03/10/2012 0535   GFRNONAA 70* 03/10/2012 0535   GFRAA 82* 03/10/2012 0535     Assessment and Plan:  Patient Active Hospital Problem List: Acute pulmonary edema (03/06/2012)   Assessment: induced by htn crisis; improving Plan: Continue lasix daily Continue current BP regimen, defer to cardiology for further adjustments  Acute respiratory failure (03/06/2012)   Assessment: due to hypertensive crisis and acute pulmonary edema   Plan: DNR noted, no further distress Goal neg Still has sputum production, ongoing hypoxemia;  I don't think that this is pneumonia but given the findings on CXR and her sputum production would continue moxifloxacin for 7 day course Change to moxifloxacin oral for 7 day  Hypertensive crisis (03/06/2012)  Assessment:improved but still elevated   Plan: Per cardiology  ARF Lasix daily crt improved  Best practices / Disposition: -->floor status, Dr. Scarlett Presto service  -->DNR --> SCds  for DVT Px -->Protonix for GI Px _-> full liq, get swallow, rt base infiltrate? asp  Anuhea Gassner 03/10/2012, 11:53 AM  PCCM to sign off, call if questions

## 2012-03-11 ENCOUNTER — Telehealth: Payer: Self-pay | Admitting: Internal Medicine

## 2012-03-11 MED ORDER — ACETAMINOPHEN 325 MG PO TABS
650.0000 mg | ORAL_TABLET | Freq: Once | ORAL | Status: AC
  Administered 2012-03-11: 650 mg via ORAL
  Filled 2012-03-11 (×2): qty 2

## 2012-03-11 NOTE — Progress Notes (Signed)
Physical Therapy Treatment Patient Details Name: [name] MRN: [id number] DOB: [birth year] Today's Date: 03/11/2012  PT Assessment/Plan  PT - Assessment/Plan Comments on Treatment Session: Pt with nonproductive cough when getting OOB and decrease SaO2 with mobility to 87% on RA.  Therefore donn 2L O2 and SaO2 increased to 94%.  Pt with increased fatigue this session and needed occasional standing rest breaks with ambulation.  Pt may benefit from home O2 and continue to monitor SaO2 at d/c.  PT Plan: Discharge plan remains appropriate;Frequency remains appropriate PT Frequency: Min 3X/week Follow Up Recommendations: Home health PT;Supervision/Assistance - 24 hour Equipment Recommended: 3 in 1 bedside comode PT Goals  Acute Rehab PT Goals PT Goal Formulation: With patient Time For Goal Achievement: 7 days Pt will Roll Supine to Right Side: Independently;with rail PT Goal: Rolling Supine to Right Side - Progress: Progressing toward goal Pt will go Supine/Side to Sit: with modified independence;with rail PT Goal: Supine/Side to Sit - Progress: Progressing toward goal Pt will go Sit to Supine/Side: with modified independence;with HOB 0 degrees;with rail PT Goal: Sit to Supine/Side - Progress: Progressing toward goal Pt will go Sit to Stand: with supervision PT Goal: Sit to Stand - Progress: Progressing toward goal Pt will go Stand to Sit: with supervision PT Goal: Stand to Sit - Progress: Progressing toward goal Pt will Ambulate: 16 - 50 feet;with rolling walker PT Goal: Ambulate - Progress: Progressing toward goal  PT Treatment Precautions/Restrictions  Precautions Precautions: Fall Restrictions Weight Bearing Restrictions: No Mobility (including Balance) Bed Mobility Bed Mobility: Yes Rolling Right: Other (comment);4: Min assist (minguard) Rolling Right Details (indicate cue type and reason): Minguard for safety with max cues for technique Right Sidelying to Sit: 4: Min  assist Right Sidelying to Sit Details (indicate cue type and reason): ( Sitting - Scoot to Edge of Bed: 4: Min assist Sitting - Scoot to [place] of Bed Details (indicate cue type and reason): (A) to elevate trunk OOB with cues for hand placement Transfers Transfers: Yes Sit to Stand: 4: Min assist;With upper extremity assist;From bed;From toilet Sit to Stand Details (indicate cue type and reason): (A) to initiate transfer with max cues for hand placement.  Pt continues to keep hands on RW with transfer. Stand to Sit: 4: Min assist;To chair/3-in-1;With armrests;With upper extremity assist;To toilet Stand to Sit Details: Pt required min assist to control descent and max VC for hand placement. Ambulation/Gait Ambulation/Gait: Yes Ambulation/Gait Assistance: 4: Min assist Ambulation/Gait Assistance Details (indicate cue type and reason): (A) for safety and maintain RW placement.  Pt tends to take hands off RW and put hands on furniture to balance. Ambulation Distance (Feet): 30 Feet Assistive device: Rolling walker Gait Pattern: Step-to pattern;Decreased stride length Gait velocity: Decreased Stairs: No  Posture/Postural Control Posture/Postural Control: No significant limitations Balance Balance Assessed: Yes Static Standing Balance Static Standing - Balance Support: No upper extremity supported Static Standing - Level of Assistance: 5: Stand by assistance Exercise    End of Session PT - End of Session Equipment Utilized During Treatment: Gait belt Activity Tolerance: Patient limited by fatigue Patient left: in chair;with call bell in reach Nurse Communication: Mobility status for transfers;Mobility status for ambulation General Behavior During Session: [facility] for tasks performed Cognition: [facility] for tasks performed  Keairra Bardon 03/11/2012, 1:22 PM [place], [place] DPT [phone]

## 2012-03-11 NOTE — Progress Notes (Signed)
Subjective: No complaints.  Objective: Vital signs in last 24 hours: Temp:  [97.5 F (36.4 C)-98.8 F (37.1 C)] 97.9 F (36.6 C) (03/22 0600) Pulse Rate:  [75-86] 82  (03/22 0600) Resp:  [18-20] 18  (03/22 0600) BP: (134-151)/(41-75) 136/62 mmHg (03/22 0600) SpO2:  [90 %-95 %] 92 % (03/22 0600) Weight change:  Last BM Date: 03/03/12 (About a week per patient)  Intake/Output from previous day:   Intake/Output this shift:    General appearance: alert and cooperative Resp: clear to auscultation bilaterally Cardio: regular rate and rhythm, S1, S2 normal, no murmur, click, rub or gallop  Lab Results:  [facility] 03/10/12 0535  WBC 7.6  HGB 14.1  HCT 42.3  PLT 205   BMET  Basename 03/10/12 0535 03/09/12 0522  NA 137 137  K 3.0* 3.6  CL 95* 98  CO2 34* 31  GLUCOSE 130* 109*  BUN 19 18  CREATININE 0.73 0.67  CALCIUM 9.1 8.5    Studies/Results: Dg Chest Port 1 View  03/09/2012  *RADIOLOGY REPORT*  Clinical Data: Evaluate endotracheal tube  PORTABLE CHEST - 1 VIEW  Comparison: 03/08/2012; 03/07/2012; 03/06/2012  Findings:  Grossly unchanged enlarged cardiac silhouette and mediastinal contours with extensive atherosclerotic calcifications within the thoracic aorta.  Grossly unchanged right mid and lower lung and left lower lung heterogeneous air space opacities.  There is unchanged increased prominence of the pulmonary interstitium diffusely, most conspicuous within the right upper lung. Persistent mild elevation of the right hemidiaphragm.  No definite pleural effusion or pneumothorax.  Grossly unchanged bones.  IMPRESSION: No significant interval change in bilateral heterogeneous air space opacities, right greater than left, worrisome for multifocal infection.  Original Report Authenticated By: Waynard Reeds, M.D.    Medications: I have reviewed the patient's current medications.  Assessment/Plan: Active Problems:  Acute pulmonary edema  Resolved.  O2 sat 93 RA  Acute  respiratory failure resolved  Hypertensive crisis  BP ok  Myocardial infarction acute  Discharge today   LOS: 5 days   Chanon Loney JOSEPH 03/11/2012, 6:36 AM

## 2012-03-11 NOTE — Progress Notes (Signed)
   CARE MANAGEMENT NOTE 03/11/2012  Patient:  TERRIE, HARING   Account Number:  [id number]  Date Initiated:  03/08/2012  Documentation initiated by:  [facility]  Subjective/Objective Assessment:   syncopal - hypertensive -  Has daughter.     Action/Plan:   03/11/2012 Met with pt re HH needs, pt selected [facility] for [facility] and DMe   Anticipated DC Date:  03/11/2012   Anticipated DC Plan:  HOME W HOME HEALTH SERVICES      DC Planning Services  CM consult      PAC Choice  DURABLE MEDICAL EQUIPMENT  HOME HEALTH   Choice offered to / List presented to:  C-1 Patient   DME arranged  3-N-1      DME agency  [facility]     HH arranged  HH-2 PT      [facility] agency  [facility]   Status of service:  Completed, signed off Medicare Important Message given?   (If response is "NO", the following Medicare IM given date fields will be blank) Date Medicare IM given:   Date Additional Medicare IM given:    Discharge Disposition:  HOME W HOME HEALTH SERVICES  Per UR Regulation:  Reviewed for med. necessity/level of care/duration of stay  If discussed at Long Length of Stay Meetings, dates discussed:    Comments:  03/11/2012 Pt selected [facility] for [facility] and DME, requested "Pam" for physical therapy, [facility] notified. Johny Shock RN MPH Case manager [phone]

## 2012-03-15 ENCOUNTER — Other Ambulatory Visit: Payer: Self-pay | Admitting: *Deleted

## 2012-03-15 MED ORDER — ATENOLOL 100 MG PO TABS: 100.0000 mg | ORAL_TABLET | Freq: Every day | ORAL | Status: DC

## 2012-03-15 MED ORDER — FUROSEMIDE 40 MG PO TABS: 40.0000 mg | ORAL_TABLET | Freq: Every day | ORAL | Status: DC

## 2012-03-15 MED ORDER — DILTIAZEM HCL ER BEADS 240 MG PO CP24: 240.0000 mg | ORAL_CAPSULE | Freq: Every day | ORAL | Status: DC

## 2012-03-15 NOTE — Telephone Encounter (Signed)
Rx[s] sent to pharmacy and response faxed to [facility] [phone]

## 2012-03-22 ENCOUNTER — Encounter: Payer: Self-pay | Admitting: Internal Medicine

## 2012-03-22 ENCOUNTER — Ambulatory Visit: Payer: Medicare Other

## 2012-03-22 ENCOUNTER — Ambulatory Visit (INDEPENDENT_AMBULATORY_CARE_PROVIDER_SITE_OTHER): Payer: Medicare Other | Admitting: Internal Medicine

## 2012-03-22 ENCOUNTER — Ambulatory Visit (INDEPENDENT_AMBULATORY_CARE_PROVIDER_SITE_OTHER)
Admission: RE | Admit: 2012-03-22 | Discharge: 2012-03-22 | Disposition: A | Discharge status: Home / Self Care | Payer: Medicare Other | Source: Ambulatory Visit | Attending: Internal Medicine | Admitting: Internal Medicine

## 2012-03-22 VITALS — BP 166/72 | HR 67 | Temp 97.6°F | Resp 16 | Wt 134.0 lb

## 2012-03-22 DIAGNOSIS — I4891 Unspecified atrial fibrillation: Secondary | ICD-10-CM

## 2012-03-22 DIAGNOSIS — R0989 Other specified symptoms and signs involving the circulatory and respiratory systems: Secondary | ICD-10-CM

## 2012-03-22 DIAGNOSIS — I509 Heart failure, unspecified: Secondary | ICD-10-CM

## 2012-03-22 DIAGNOSIS — I10 Essential (primary) hypertension: Secondary | ICD-10-CM

## 2012-03-22 DIAGNOSIS — R0602 Shortness of breath: Secondary | ICD-10-CM

## 2012-03-22 DIAGNOSIS — M549 Dorsalgia, unspecified: Secondary | ICD-10-CM

## 2012-03-22 DIAGNOSIS — Z79899 Other long term (current) drug therapy: Secondary | ICD-10-CM

## 2012-03-22 DIAGNOSIS — I2510 Atherosclerotic heart disease of native coronary artery without angina pectoris: Secondary | ICD-10-CM

## 2012-03-22 LAB — BASIC METABOLIC PANEL
BUN: 17 mg/dL (ref 6–23)
CO2: 35 mEq/L — ABNORMAL HIGH (ref 19–32)
Chloride: 97 mEq/L (ref 96–112)
Creatinine, Ser: 1 mg/dL (ref 0.4–1.2)
Glucose, Bld: 104 mg/dL — ABNORMAL HIGH (ref 70–99)
Potassium: 4.6 mEq/L (ref 3.5–5.1)

## 2012-03-22 NOTE — Progress Notes (Signed)
  Subjective:    Patient ID: [name], female    DOB: [birth year], 78 y.o.   MRN: [id number]  HPI [name] presents for hospital follow-up. She had been admitted for syncope and hypoxemia with abnormal chest x-ray. She had elevated troponins but CK total and MB were ok. Telemetry was ok. Blood pressure was controlled, SOB improved, oxygen saturation by time of discharge was normal and she did not meet criteria for home O2.  No explanation for syncope. There was evidence CHF with elevated pBNP.   Since discharge she has not had any recurrent syncope. She reports that her breathing has been ok. She has been getting her strength back. Her chief complaint is back pain at the upper lumbar region.   Past Medical History  Diagnosis Date  . CAD (coronary artery disease)   . Hyperlipidemia   . Osteoporosis   . PVD (peripheral vascular disease)   . HTN (hypertension)   . Diverticulosis of colon   . GERD (gastroesophageal reflux disease)   . Hip fracture, right   . Decubitus ulcer     Right heel  . COPD (chronic obstructive pulmonary disease)   . Anxiety   . Anemia   . Esophageal stricture   . CHF (congestive heart failure)   . Atrial fibrillation   . Fibromuscular dysplasia     Renal artery  . Myocardial infarction    Past Surgical History  Procedure Date  . Ptca 04/2003  . Cataract extraction     Bilateral  . Orif acetabular fracture 2009   No family history on file. History   Social History  . Marital Status: Widowed    Spouse Name: N/A    Number of Children: N/A  . Years of Education: N/A   Occupational History  . Retired, Engineer, manufacturing at [employer], [employer] - Manufacturing engineer    Social History Main Topics  . Smoking status: Never Smoker   . Smokeless tobacco: Never Used  . Alcohol Use: No  . Drug Use: No  . Sexually Active: Not Currently   Other Topics Concern  . Not on file   Social History Narrative   Married '36 - '43 Divorced: Married '46 - '82 widowed . Lives  alone, grandson lives w/her. I-ADLs, daughter is close at hand.  END of Life Care: DNR, DNI, No heroic or futile       Review of Systems System review is negative for any constitutional, cardiac, pulmonary, GI or neuro symptoms or complaints other than as described in the HPI.     Objective:   Physical Exam Filed Vitals:   03/22/12 1312  BP: 166/72  Pulse: 67  Temp: 97.6 F (36.4 C)  Resp: 16  O2 sat  094% Wt Readings from Last 3 Encounters:  03/22/12 134 lb (60.782 kg)  03/11/12 134 lb 4.2 oz (60.9 kg)  02/17/12 141 lb 2 oz (64.014 kg)    Gen'l- older, wizzened white woman in no acute distress HEENT - visibly lost weight. Cor - 2+ radial pulse, RRR Pulm - good BS, no rales or wheezes. Ext - w/o edema       Assessment & Plan:

## 2012-03-22 NOTE — Patient Instructions (Signed)
Heart failure - seems to be good today, clear lungs. Plan - lab work today to check kidney function, potassium and heart failure. "rule of 2's" - weigh every day at the same time, same garb; if you gain more than 2 lbs in 2 days take 2 x the lasix for 2 days. This is important in keeping ahead of fluid collection.  Back pain - will check x-rays today. You may take 2 or 3 ibuprofen three times a day. Watch out for belly pain or black stools.

## 2012-03-23 ENCOUNTER — Encounter: Payer: Self-pay | Admitting: Internal Medicine

## 2012-03-23 DIAGNOSIS — M549 Dorsalgia, unspecified: Secondary | ICD-10-CM | POA: Insufficient documentation

## 2012-03-23 NOTE — Assessment & Plan Note (Signed)
BP Readings from Last 3 Encounters:  03/22/12 166/72  03/11/12 136/62  03/06/12 220/80    Variable control. Recent hospitalization with hypertensive crisis.  Plan - continue present medications.            Monitor BP periodically at home and report back for persistent elevations.

## 2012-03-23 NOTE — Assessment & Plan Note (Signed)
Patient's primary c/o is back pain - low thoracic upper lumbar region. No paresthesia, weakness. No deformity. She has been taking low dose ibuprofen.   X-ray: 03/22/12 The patient has severe degenerative disc and joint disease at L3-4  and at L5-S1 with grade 1 spondylolisthesis of L5 on S1.  IMPRESSION:  1. Old compression fracture of L1.  2. Compression fracture L2, indeterminate age. However, I suspect  it is old as well.  Plan - with no radicular symptoms and no evidence of new compression fracture will treat with NSAIDs - up to 600 mg ibuprofen tid.

## 2012-03-23 NOTE — Assessment & Plan Note (Signed)
Stable PAF with good BP and rate control

## 2012-03-23 NOTE — Assessment & Plan Note (Signed)
No evidence of decompensation - good breath sounds.  Plan - continue present medications           reveiwed with the patient and her son the "rule of 2's." Home health is having her keep a record of her weights.

## 2012-03-24 ENCOUNTER — Other Ambulatory Visit: Payer: Self-pay | Admitting: Internal Medicine

## 2012-04-08 DIAGNOSIS — M549 Dorsalgia, unspecified: Secondary | ICD-10-CM

## 2012-04-08 DIAGNOSIS — J449 Chronic obstructive pulmonary disease, unspecified: Secondary | ICD-10-CM

## 2012-04-08 DIAGNOSIS — I509 Heart failure, unspecified: Secondary | ICD-10-CM

## 2012-04-08 DIAGNOSIS — I10 Essential (primary) hypertension: Secondary | ICD-10-CM

## 2012-04-18 ENCOUNTER — Other Ambulatory Visit: Payer: Self-pay | Admitting: Internal Medicine

## 2012-04-23 ENCOUNTER — Other Ambulatory Visit: Payer: Self-pay | Admitting: Internal Medicine

## 2012-06-26 ENCOUNTER — Other Ambulatory Visit: Payer: Self-pay | Admitting: Internal Medicine

## 2012-06-27 ENCOUNTER — Other Ambulatory Visit: Payer: Self-pay | Admitting: General Practice

## 2012-06-27 MED ORDER — RANITIDINE HCL 150 MG PO TABS: 150.0000 mg | ORAL_TABLET | Freq: Two times a day (BID) | ORAL | Status: DC

## 2012-07-18 ENCOUNTER — Ambulatory Visit (INDEPENDENT_AMBULATORY_CARE_PROVIDER_SITE_OTHER): Payer: Medicare Other | Admitting: Internal Medicine

## 2012-07-18 ENCOUNTER — Encounter: Payer: Self-pay | Admitting: Internal Medicine

## 2012-07-18 VITALS — BP 150/58 | HR 58 | Temp 97.0°F | Resp 16 | Wt 138.0 lb

## 2012-07-18 DIAGNOSIS — I10 Essential (primary) hypertension: Secondary | ICD-10-CM

## 2012-07-18 DIAGNOSIS — I2510 Atherosclerotic heart disease of native coronary artery without angina pectoris: Secondary | ICD-10-CM

## 2012-07-18 DIAGNOSIS — I739 Peripheral vascular disease, unspecified: Secondary | ICD-10-CM

## 2012-07-18 DIAGNOSIS — I509 Heart failure, unspecified: Secondary | ICD-10-CM

## 2012-07-18 DIAGNOSIS — I4891 Unspecified atrial fibrillation: Secondary | ICD-10-CM

## 2012-07-18 NOTE — Progress Notes (Signed)
Subjective:    Patient ID: [name], female    DOB: [birth year], 78 y.o.   MRN: [id number]  HPI [name] presents with a concern for her medications. She feels her lasix dose is to high because it keeps her up all night with urination. She takes the lasix at about 7 AM. She has no other complaints: no c/p, SOB. She does report that she will have occasional bouts of diarrhea. In the main she is feeling OK, especially in light of her age.  Past Medical History  Diagnosis Date  . CAD (coronary artery disease)   . Hyperlipidemia   . Osteoporosis   . PVD (peripheral vascular disease)   . HTN (hypertension)   . Diverticulosis of colon   . GERD (gastroesophageal reflux disease)   . Hip fracture, right   . Decubitus ulcer     Right heel  . COPD (chronic obstructive pulmonary disease)   . Anxiety   . Anemia   . Esophageal stricture   . CHF (congestive heart failure)   . Atrial fibrillation   . Fibromuscular dysplasia     Renal artery  . Myocardial infarction    Past Surgical History  Procedure Date  . Ptca 04/2003  . Cataract extraction     Bilateral  . Orif acetabular fracture 2009   No family history on file. History   Social History  . Marital Status: Widowed    Spouse Name: N/A    Number of Children: N/A  . Years of Education: N/A   Occupational History  . Retired, Engineer, manufacturing at [employer], [employer] - Manufacturing engineer    Social History Main Topics  . Smoking status: Never Smoker   . Smokeless tobacco: Never Used  . Alcohol Use: No  . Drug Use: No  . Sexually Active: Not Currently   Other Topics Concern  . Not on file   Social History Narrative   Married '36 - '43 Divorced: Married '46 - '82 widowed . Lives alone, grandson lives w/her. I-ADLs, daughter is close at hand.  END of Life Care: DNR, DNI, No heroic or futile    Current Outpatient Prescriptions on File Prior to Visit  Medication Sig Dispense Refill  . aspirin 325 MG tablet Take 325 mg by mouth daily.         Marland Kitchen atenolol (TENORMIN) 100 MG tablet Take 1 tablet (100 mg total) by mouth daily.  30 tablet  5  . cilostazol (PLETAL) 100 MG tablet Take 100 mg by mouth 2 (two) times daily.      . clopidogrel (PLAVIX) 75 MG tablet Take 75 mg by mouth daily.      Marland Kitchen diltiazem (CARDIZEM CD) 120 MG 24 hr capsule TAKE ONE CAPSULE BY MOUTH EVERY DAY  30 capsule  10  . diltiazem (TIAZAC) 240 MG 24 hr capsule Take 1 capsule (240 mg total) by mouth daily.  30 capsule  5  . furosemide (LASIX) 40 MG tablet Take 1 tablet (40 mg total) by mouth daily.  30 tablet  5  . ranitidine (ZANTAC) 150 MG tablet TAKE 1 TABLET BY MOUTH TWICE A DAY FOR STOMACH  60 tablet  5  . triamcinolone cream (KENALOG) 0.1 % Apply 1 application topically 2 (two) times daily.      Marland Kitchen atenolol (TENORMIN) 50 MG tablet Take 1 tablet (50 mg total) by mouth daily.  30 tablet  11  . fluticasone (FLONASE) 50 MCG/ACT nasal spray Place 1 spray into the nose 2 (two)  times daily.      . furosemide (LASIX) 20 MG tablet Take 1 tablet (20 mg total) by mouth daily.  30 tablet  11  . DISCONTD: ranitidine (ZANTAC) 150 MG tablet Take 1 tablet (150 mg total) by mouth 2 (two) times daily.  60 tablet  3  . DISCONTD: zolpidem (AMBIEN) 10 MG tablet Take 10 mg by mouth at bedtime as needed.            Review of Systems System review is negative for any constitutional, cardiac, pulmonary, GI or neuro symptoms or complaints other than as described in the HPI.     Objective:   Physical Exam Filed Vitals:   07/18/12 1434  BP: 150/58  Pulse: 58  Temp: 97 F (36.1 C)  Resp: 16   gen'l - older woman in no distress who looks great for her age. HEENT- C&S clear Cor- 2+ radial pulse, IRIR II/VI murmur Pulm - no increased WOB, no rales or wheezing Abd- BS+, soft, no guarding or rebound MSK - no joint deformity Neuro- A&O, CN II-XII intact. She does use a rolling walker.        Assessment & Plan:

## 2012-07-18 NOTE — Assessment & Plan Note (Signed)
H/o diastolic failure currently well compensated. Reviewed her med list - her dose of furosemide is appropriate with good results.   Plan Continue present medications.  For persistent nocturia a trial of medication for OAB

## 2012-07-18 NOTE — Assessment & Plan Note (Signed)
Stable with good rate control on CCB and BB

## 2012-07-18 NOTE — Assessment & Plan Note (Signed)
Stable with no complaints of c/p or limitation in activity

## 2012-07-18 NOTE — Patient Instructions (Addendum)
1. Reviewed all her medications - these are all appropriate for the management of your medical conditions. No changes are proposed.  2. Nocturia - having to urinate at night. It is unlikely that this is due to the Lasix but more likely due to overactive bladder. There is medication if this gets to be too much of a problem.  3. Heart failure - doing very well.  Come back and see me in 4 months.

## 2012-07-18 NOTE — Assessment & Plan Note (Signed)
BP Readings from Last 3 Encounters:  07/18/12 150/58  03/22/12 166/72  03/11/12 136/62   Adequate control on a multi-drug regimen. Risks of hypotension out weigh benefit of tighter control.

## 2012-07-18 NOTE — Assessment & Plan Note (Signed)
Doing well on pletal: able to ambulate without leg pain or fatigue. No significant fluid retention or decompensation of CHF.  Plan Continue present regimen

## 2012-07-21 ENCOUNTER — Other Ambulatory Visit: Payer: Self-pay | Admitting: Internal Medicine

## 2012-09-25 ENCOUNTER — Other Ambulatory Visit: Payer: Self-pay | Admitting: Internal Medicine

## 2012-09-27 ENCOUNTER — Other Ambulatory Visit: Payer: Self-pay | Admitting: Internal Medicine

## 2012-10-31 ENCOUNTER — Ambulatory Visit: Payer: Medicare Other | Admitting: Internal Medicine

## 2012-11-12 ENCOUNTER — Other Ambulatory Visit: Payer: Self-pay | Admitting: Internal Medicine

## 2012-11-15 ENCOUNTER — Ambulatory Visit: Payer: Medicare Other | Admitting: Internal Medicine

## 2012-11-16 ENCOUNTER — Ambulatory Visit: Payer: Medicare Other | Admitting: Internal Medicine

## 2012-12-01 ENCOUNTER — Other Ambulatory Visit: Payer: Self-pay | Admitting: Internal Medicine

## 2012-12-01 ENCOUNTER — Telehealth: Payer: Self-pay | Admitting: *Deleted

## 2012-12-01 NOTE — Telephone Encounter (Signed)
Patient c/o increased urinary freq and skin rash. She has transportation problems.   Advised to continue present regimen for BP treatment. For skin rash needs ov or referral to derm.  She will try to arrange OV and transportation.

## 2012-12-01 NOTE — Telephone Encounter (Signed)
Pt called requesting to speak with Dr Debby Bud. She wants to talk to dr about her meds. Please return call.

## 2013-02-02 ENCOUNTER — Other Ambulatory Visit: Payer: Self-pay | Admitting: Internal Medicine

## 2013-03-07 ENCOUNTER — Ambulatory Visit: Payer: Medicare Other | Admitting: Internal Medicine

## 2013-03-17 ENCOUNTER — Other Ambulatory Visit: Payer: Self-pay | Admitting: Internal Medicine

## 2013-03-25 ENCOUNTER — Other Ambulatory Visit: Payer: Self-pay | Admitting: Internal Medicine

## 2013-05-18 ENCOUNTER — Other Ambulatory Visit (INDEPENDENT_AMBULATORY_CARE_PROVIDER_SITE_OTHER): Payer: Medicare PPO

## 2013-05-18 ENCOUNTER — Ambulatory Visit (INDEPENDENT_AMBULATORY_CARE_PROVIDER_SITE_OTHER): Payer: Medicare PPO | Admitting: Internal Medicine

## 2013-05-18 VITALS — BP 188/56 | HR 40 | Temp 96.8°F | Wt 129.8 lb

## 2013-05-18 DIAGNOSIS — I10 Essential (primary) hypertension: Secondary | ICD-10-CM

## 2013-05-18 DIAGNOSIS — R300 Dysuria: Secondary | ICD-10-CM

## 2013-05-18 DIAGNOSIS — K219 Gastro-esophageal reflux disease without esophagitis: Secondary | ICD-10-CM

## 2013-05-18 DIAGNOSIS — D649 Anemia, unspecified: Secondary | ICD-10-CM

## 2013-05-18 DIAGNOSIS — J309 Allergic rhinitis, unspecified: Secondary | ICD-10-CM

## 2013-05-18 LAB — COMPREHENSIVE METABOLIC PANEL
ALT: 20 U/L (ref 0–35)
AST: 21 U/L (ref 0–37)
Albumin: 3.6 g/dL (ref 3.5–5.2)
Alkaline Phosphatase: 60 U/L (ref 39–117)
BUN: 20 mg/dL (ref 6–23)
Calcium: 9.7 mg/dL (ref 8.4–10.5)
Chloride: 100 mEq/L (ref 96–112)
Creatinine, Ser: 0.8 mg/dL (ref 0.4–1.2)
Potassium: 4.9 mEq/L (ref 3.5–5.1)

## 2013-05-18 LAB — URINALYSIS, ROUTINE W REFLEX MICROSCOPIC
Bilirubin Urine: NEGATIVE
Ketones, ur: NEGATIVE
Specific Gravity, Urine: 1.025 (ref 1.000–1.030)
pH: 6 (ref 5.0–8.0)

## 2013-05-18 LAB — CBC WITH DIFFERENTIAL/PLATELET
Basophils Absolute: 0.1 10*3/uL (ref 0.0–0.1)
Basophils Relative: 0.9 % (ref 0.0–3.0)
Eosinophils Absolute: 0.1 10*3/uL (ref 0.0–0.7)
Hemoglobin: 14.2 g/dL (ref 12.0–15.0)
MCHC: 33.9 g/dL (ref 30.0–36.0)
MCV: 83.3 fl (ref 78.0–100.0)
Monocytes Absolute: 0.6 10*3/uL (ref 0.1–1.0)
Neutro Abs: 5.1 10*3/uL (ref 1.4–7.7)
RBC: 5.04 Mil/uL (ref 3.87–5.11)
RDW: 14.5 % (ref 11.5–14.6)

## 2013-05-18 MED ORDER — FLUTICASONE PROPIONATE 50 MCG/ACT NA SUSP: 1.0000 | Freq: Every day | NASAL | Status: DC

## 2013-05-18 NOTE — Progress Notes (Signed)
Subjective:    Patient ID: [name], female    DOB: 1915/09/12, 79 y.o.   MRN: [id number]  HPI Mrs. Thunder presents for evaluation of weight loss - 9 lb since July '13. She admits to poor appetite and inability to eat more than small portions.  She has had ear trouble and has seen Dr. Haroldine Laws twice and is being treatment. She does not have pain but has more hearing impairment.   Cardiac - she reports that she has been doing ok. No recurrent syncope. Last hospitalizatoin July '13.   She has had urinary frequency and urgency.  Past Medical History  Diagnosis Date  . CAD (coronary artery disease)   . Hyperlipidemia   . Osteoporosis   . PVD (peripheral vascular disease)   . HTN (hypertension)   . Diverticulosis of colon   . GERD (gastroesophageal reflux disease)   . Hip fracture, right   . Decubitus ulcer     Right heel  . COPD (chronic obstructive pulmonary disease)   . Anxiety   . Anemia   . Esophageal stricture   . CHF (congestive heart failure)   . Atrial fibrillation   . Fibromuscular dysplasia     Renal artery  . Myocardial infarction    Past Surgical History  Procedure Laterality Date  . Ptca  04/2003  . Cataract extraction      Bilateral  . Orif acetabular fracture  2009   No family history on file. History   Social History  . Marital Status: Widowed    Spouse Name: N/A    Number of Children: N/A  . Years of Education: N/A   Occupational History  . Retired, Engineer, manufacturing at [employer], [employer] - Manufacturing engineer    Social History Main Topics  . Smoking status: Never Smoker   . Smokeless tobacco: Never Used  . Alcohol Use: No  . Drug Use: No  . Sexually Active: Not Currently   Other Topics Concern  . Not on file   Social History Narrative   Married '36 - '43 Divorced: Married '46 - '82 widowed . Lives alone, grandson lives w/her. I-ADLs, daughter is close at hand.  END of Life Care: DNR, DNI, No heroic or futile    Current Outpatient Prescriptions  on File Prior to Visit  Medication Sig Dispense Refill  . aspirin 325 MG tablet Take 325 mg by mouth daily.        Marland Kitchen atenolol (TENORMIN) 100 MG tablet TAKE 1 TABLET BY MOUTH DAILY  30 tablet  2  . cilostazol (PLETAL) 100 MG tablet Take 100 mg by mouth 2 (two) times daily.      . clopidogrel (PLAVIX) 75 MG tablet Take 75 mg by mouth daily.      . clopidogrel (PLAVIX) 75 MG tablet TAKE 1 TABLET BY MOUTH EVERY DAY  30 tablet  5  . diltiazem (CARDIZEM CD) 120 MG 24 hr capsule TAKE ONE CAPSULE BY MOUTH EVERY DAY  30 capsule  10  . diltiazem (TIAZAC) 240 MG 24 hr capsule TAKE ONE CAPSULE BY MOUTH EVERY DAY  30 capsule  5  . furosemide (LASIX) 40 MG tablet TAKE 1 TABLET BY MOUTH EVERY DAY  30 tablet  5  . furosemide (LASIX) 40 MG tablet TAKE 1 TABLET BY MOUTH EVERY DAY  30 tablet  5  . ranitidine (ZANTAC) 150 MG tablet TAKE 1 TABLET BY MOUTH TWICE A DAY FOR STOMACH  60 tablet  5  . triamcinolone cream (KENALOG) 0.1 %  APPLY TWICE A DAY  30 g  3  . atenolol (TENORMIN) 50 MG tablet Take 1 tablet (50 mg total) by mouth daily.  30 tablet  11  . fluticasone (FLONASE) 50 MCG/ACT nasal spray Place 1 spray into the nose 2 (two) times daily.      . furosemide (LASIX) 20 MG tablet Take 1 tablet (20 mg total) by mouth daily.  30 tablet  11  . [DISCONTINUED] zolpidem (AMBIEN) 10 MG tablet Take 10 mg by mouth at bedtime as needed.         No current facility-administered medications on file prior to visit.      Review of Systems System review is negative for any constitutional, cardiac, pulmonary, GI or neuro symptoms or complaints other than as described in the HPI.     Objective:   Physical Exam Filed Vitals:   05/18/13 1412  BP: 188/56  Pulse: 40  Temp: 96.8 F (36 C)   BP Readings from Last 3 Encounters:  05/18/13 188/56  07/18/12 150/58  03/22/12 166/72    Wt Readings from Last 3 Encounters:  05/18/13 129 lb 12.8 oz (58.877 kg)  07/18/12 138 lb (62.596 kg)  03/22/12 134 lb (60.782 kg)    Gen'l- elderly white woman in no distress. She does appear thin and with more wrinkles HEENT- narrow EACs, TMs not visualized Neck - supple Cor- 2+ radial, IRIR slow heart rate PUlm - CTAP Neuro - HOH, A&O, conversant   Recent Results (from the past 2160 hour(s))  URINALYSIS, ROUTINE W REFLEX MICROSCOPIC     Status: None   Collection Time    05/18/13  2:56 PM      Result Value Range   Color, Urine LT. YELLOW  Yellow;Lt. Yellow   APPearance CLEAR  Clear   Specific Gravity, Urine 1.025  1.000-1.030   pH 6.0  5.0 - 8.0   Total Protein, Urine TRACE  Negative   Urine Glucose NEGATIVE  Negative   Ketones, ur NEGATIVE  Negative   Bilirubin Urine NEGATIVE  Negative   Hgb urine dipstick MODERATE  Negative   Urobilinogen, UA 1.0  0.0 - 1.0   Leukocytes, UA MODERATE  Negative   Nitrite NEGATIVE  Negative   WBC, UA 7-10/hpf  0-2/hpf   RBC / HPF 0-2/hpf  0-2/hpf   Squamous Epithelial / LPF Many(>10/hpf)  Rare(0-4/hpf)   Bacteria, UA Rare(<10/hpf)  None  COMPREHENSIVE METABOLIC PANEL     Status: Abnormal   Collection Time    05/18/13  2:56 PM      Result Value Range   Sodium 137  135 - 145 mEq/L   Potassium 4.9  3.5 - 5.1 mEq/L   Chloride 100  96 - 112 mEq/L   CO2 30  19 - 32 mEq/L   Glucose, Bld 108 (*) 70 - 99 mg/dL   BUN 20  6 - 23 mg/dL   Creatinine, Ser 0.8  0.4 - 1.2 mg/dL   Total Bilirubin 0.7  0.3 - 1.2 mg/dL   Alkaline Phosphatase 60  39 - 117 U/L   AST 21  0 - 37 U/L   ALT 20  0 - 35 U/L   Total Protein 7.0  6.0 - 8.3 g/dL   Albumin 3.6  3.5 - 5.2 g/dL   Calcium 9.7  8.4 - 78.2 mg/dL   GFR 95.62  >13.08 mL/min  CBC WITH DIFFERENTIAL     Status: None   Collection Time    05/18/13  2:56  PM      Result Value Range   WBC 8.0  4.5 - 10.5 K/uL   RBC 5.04  3.87 - 5.11 Mil/uL   Hemoglobin 14.2  12.0 - 15.0 g/dL   HCT 16.1  09.6 - 04.5 %   MCV 83.3  78.0 - 100.0 fl   MCHC 33.9  30.0 - 36.0 g/dL   RDW 40.9  81.1 - 91.4 %   Platelets 217.0  150.0 - 400.0 K/uL    Neutrophils Relative % 63.0  43.0 - 77.0 %   Lymphocytes Relative 27.5  12.0 - 46.0 %   Monocytes Relative 7.3  3.0 - 12.0 %   Eosinophils Relative 1.3  0.0 - 5.0 %   Basophils Relative 0.9  0.0 - 3.0 %   Neutro Abs 5.1  1.4 - 7.7 K/uL   Lymphs Abs 2.2  0.7 - 4.0 K/uL   Monocytes Absolute 0.6  0.1 - 1.0 K/uL   Eosinophils Absolute 0.1  0.0 - 0.7 K/uL   Basophils Absolute 0.1  0.0 - 0.1 K/uL            Assessment & Plan:  1. UTI - mild uti with no leukocytosis and low level pyuria Plan  Septra DS bid x 5 days.

## 2013-05-18 NOTE — Patient Instructions (Addendum)
1. Urinary track infection - plan is to check a urinalysis with recommendations to follow  2. Blood pressure  - high today. You should be taking Lasix (furosemide) once a day, Diltiazem once a day, Atenolol once a day - 3 medications  3. History of Anemia - will check blood count today.  4. Weight loss - 9 lbs less than [place] '13. Plan - have one or two cans a day of Boost or Ensure.   5. Allergy - use fluticasone nasal spray - one spray to each nostril every morning, then brush your teeth and gargle.

## 2013-05-21 MED ORDER — SULFAMETHOXAZOLE-TRIMETHOPRIM 800-160 MG PO TABS: 1.0000 | ORAL_TABLET | Freq: Two times a day (BID) | ORAL | Status: DC

## 2013-05-21 NOTE — Assessment & Plan Note (Signed)
Problem of early satiety and poor appetite w/o abdominal pain, hemetemesis or change in bowels.  Plan Increase zantac to 150 mg twice a day  For any swallow problems - GI consult

## 2013-05-22 ENCOUNTER — Telehealth: Payer: Self-pay

## 2013-05-22 NOTE — Telephone Encounter (Signed)
Phone call to patient and left a message for her to call back.

## 2013-05-22 NOTE — Telephone Encounter (Signed)
Pt notified, she did have a hard time hearing me but she ended up stating she understood

## 2013-05-22 NOTE — Telephone Encounter (Signed)
Message copied by Noreene Larsson on Mon May 22, 2013  8:14 AM ------      Message from: Jacques Navy      Created: Sun May 21, 2013 10:22 PM       Please call patient:             1.U/A weakly positive. Rx for septra DS bid x 5 days sent to pharmacy of record.            2. For decreased appetite and early satiety please be sure to take zantac 150 mg twice a day. ------

## 2013-05-22 NOTE — Telephone Encounter (Signed)
Message copied by Noreene Larsson on Mon May 22, 2013  1:48 PM ------      Message from: Jacques Navy      Created: Sun May 21, 2013 10:22 PM       Please call patient:             1.U/A weakly positive. Rx for septra DS bid x 5 days sent to pharmacy of record.            2. For decreased appetite and early satiety please be sure to take zantac 150 mg twice a day. ------

## 2013-05-26 ENCOUNTER — Encounter: Payer: Self-pay | Admitting: Internal Medicine

## 2013-06-12 ENCOUNTER — Other Ambulatory Visit: Payer: Self-pay | Admitting: Internal Medicine

## 2013-06-29 ENCOUNTER — Encounter: Payer: Self-pay | Admitting: Internal Medicine

## 2013-06-29 ENCOUNTER — Other Ambulatory Visit (INDEPENDENT_AMBULATORY_CARE_PROVIDER_SITE_OTHER): Payer: Medicare PPO

## 2013-06-29 ENCOUNTER — Ambulatory Visit (INDEPENDENT_AMBULATORY_CARE_PROVIDER_SITE_OTHER)
Admission: RE | Admit: 2013-06-29 | Discharge: 2013-06-29 | Disposition: A | Discharge status: Home / Self Care | Payer: Medicare PPO | Source: Ambulatory Visit | Attending: Internal Medicine | Admitting: Internal Medicine

## 2013-06-29 ENCOUNTER — Ambulatory Visit (INDEPENDENT_AMBULATORY_CARE_PROVIDER_SITE_OTHER): Payer: Medicare PPO | Admitting: Internal Medicine

## 2013-06-29 VITALS — BP 170/60 | HR 65 | Temp 97.5°F | Wt 129.0 lb

## 2013-06-29 DIAGNOSIS — R0602 Shortness of breath: Secondary | ICD-10-CM

## 2013-06-29 DIAGNOSIS — R5383 Other fatigue: Secondary | ICD-10-CM

## 2013-06-29 DIAGNOSIS — B999 Unspecified infectious disease: Secondary | ICD-10-CM

## 2013-06-29 DIAGNOSIS — J189 Pneumonia, unspecified organism: Secondary | ICD-10-CM

## 2013-06-29 DIAGNOSIS — R5381 Other malaise: Secondary | ICD-10-CM

## 2013-06-29 DIAGNOSIS — N390 Urinary tract infection, site not specified: Secondary | ICD-10-CM

## 2013-06-29 DIAGNOSIS — D649 Anemia, unspecified: Secondary | ICD-10-CM

## 2013-06-29 DIAGNOSIS — I10 Essential (primary) hypertension: Secondary | ICD-10-CM

## 2013-06-29 LAB — URINALYSIS, ROUTINE W REFLEX MICROSCOPIC
Ketones, ur: NEGATIVE
Specific Gravity, Urine: 1.02 (ref 1.000–1.030)
Urobilinogen, UA: 0.2 (ref 0.0–1.0)
pH: 6 (ref 5.0–8.0)

## 2013-06-29 LAB — CBC WITH DIFFERENTIAL/PLATELET
Eosinophils Relative: 0.4 % (ref 0.0–5.0)
HCT: 44.3 % (ref 36.0–46.0)
Hemoglobin: 14.7 g/dL (ref 12.0–15.0)
Lymphocytes Relative: 11.5 % — ABNORMAL LOW (ref 12.0–46.0)
Lymphs Abs: 1.3 10*3/uL (ref 0.7–4.0)
Monocytes Relative: 5.9 % (ref 3.0–12.0)
Neutro Abs: 9 10*3/uL — ABNORMAL HIGH (ref 1.4–7.7)
Platelets: 230 10*3/uL (ref 150.0–400.0)
WBC: 11 10*3/uL — ABNORMAL HIGH (ref 4.5–10.5)

## 2013-06-29 LAB — COMPREHENSIVE METABOLIC PANEL
BUN: 20 mg/dL (ref 6–23)
CO2: 30 mEq/L (ref 19–32)
Calcium: 9.2 mg/dL (ref 8.4–10.5)
Chloride: 96 mEq/L (ref 96–112)
Creatinine, Ser: 1 mg/dL (ref 0.4–1.2)
GFR: 56.56 mL/min — ABNORMAL LOW (ref >60.00)
Glucose, Bld: 114 mg/dL — ABNORMAL HIGH (ref 70–99)

## 2013-06-29 LAB — HEPATIC FUNCTION PANEL
ALT: 40 U/L — ABNORMAL HIGH (ref 0–35)
AST: 29 U/L (ref 0–37)
Alkaline Phosphatase: 62 U/L (ref 39–117)
Total Bilirubin: 1.3 mg/dL — ABNORMAL HIGH (ref 0.3–1.2)

## 2013-06-29 LAB — BRAIN NATRIURETIC PEPTIDE: Pro B Natriuretic peptide (BNP): 635 pg/mL — ABNORMAL HIGH (ref 0.0–100.0)

## 2013-06-29 MED ORDER — MOXIFLOXACIN HCL 400 MG PO TABS: 400.0000 mg | ORAL_TABLET | Freq: Every day | ORAL | Status: DC

## 2013-06-29 NOTE — Patient Instructions (Addendum)
Thanks for coming in. You know yourself and you were right.  Lab reveals infection with an elevated white blood count. The x-ray suggests pneumonia bottom of both lungs.  Plan Avelox 400 mg once a day x 7 days  Robitussin DM 1 tsp every 6 hours as needed for cough  Tylenol 500 mg 1 or 2 tablets three times a day on schedule  Hydrate well: 4-6 8oz glasses per day  A light diet please: soups, jello, etc. Continue the ensure.

## 2013-06-29 NOTE — Progress Notes (Signed)
Subjective:    Patient ID: [name], female    DOB: July 01, 1915, 79 y.o.   MRN: [id number]  HPI [name] presents for a 3-4 day h/o feeling short of breath, no focal pain, she has been bedbound due to weakness, poor appetite, no nausea, has felt feverish, no rigors, no diarrhea, no dysuria, no chest pain or pressure, no swollen glands. She admits to rhinorrhea, cough with white sputum, no post-nasal drainage.  Past Medical History  Diagnosis Date  . CAD (coronary artery disease)   . Hyperlipidemia   . Osteoporosis   . PVD (peripheral vascular disease)   . HTN (hypertension)   . Diverticulosis of colon   . GERD (gastroesophageal reflux disease)   . Hip fracture, right   . Decubitus ulcer     Right heel  . COPD (chronic obstructive pulmonary disease)   . Anxiety   . Anemia   . Esophageal stricture   . CHF (congestive heart failure)   . Atrial fibrillation   . Fibromuscular dysplasia     Renal artery  . Myocardial infarction    Past Surgical History  Procedure Laterality Date  . Ptca  04/2003  . Cataract extraction      Bilateral  . Orif acetabular fracture  2009   History reviewed. No pertinent family history. History   Social History  . Marital Status: Widowed    Spouse Name: N/A    Number of Children: N/A  . Years of Education: N/A   Occupational History  . Retired, Engineer, manufacturing at [employer], [employer] - Manufacturing engineer    Social History Main Topics  . Smoking status: Never Smoker   . Smokeless tobacco: Never Used  . Alcohol Use: No  . Drug Use: No  . Sexually Active: Not Currently   Other Topics Concern  . Not on file   Social History Narrative   Married '36 - '43 Divorced: Married '46 - '82 widowed . Lives alone, grandson lives w/her. I-ADLs, daughter is close at hand.  END of Life Care: DNR, DNI, No heroic or futile    Current Outpatient Prescriptions on File Prior to Visit  Medication Sig Dispense Refill  . aspirin 325 MG tablet Take 325 mg by mouth  daily.        Marland Kitchen atenolol (TENORMIN) 100 MG tablet TAKE 1 TABLET BY MOUTH DAILY  30 tablet  2  . cilostazol (PLETAL) 100 MG tablet Take 100 mg by mouth 2 (two) times daily.      . clopidogrel (PLAVIX) 75 MG tablet TAKE 1 TABLET BY MOUTH EVERY DAY  30 tablet  5  . diltiazem (TIAZAC) 240 MG 24 hr capsule TAKE ONE CAPSULE BY MOUTH EVERY DAY  30 capsule  5  . fluticasone (FLONASE) 50 MCG/ACT nasal spray Place 1 spray into the nose daily. Each nostril  16 g  5  . furosemide (LASIX) 40 MG tablet TAKE 1 TABLET BY MOUTH EVERY DAY  30 tablet  5  . ranitidine (ZANTAC) 150 MG tablet TAKE 1 TABLET BY MOUTH TWICE A DAY FOR STOMACH  60 tablet  5  . ranitidine (ZANTAC) 150 MG tablet TAKE 1 TABLET BY MOUTH TWICE A DAY  60 tablet  3  . sulfamethoxazole-trimethoprim (BACTRIM DS,SEPTRA DS) 800-160 MG per tablet Take 1 tablet by mouth 2 (two) times daily.  10 tablet  0  . triamcinolone cream (KENALOG) 0.1 % APPLY TWICE A DAY  30 g  3  . atenolol (TENORMIN) 50 MG tablet Take  1 tablet (50 mg total) by mouth daily.  30 tablet  11  . furosemide (LASIX) 20 MG tablet Take 1 tablet (20 mg total) by mouth daily.  30 tablet  11  . [DISCONTINUED] zolpidem (AMBIEN) 10 MG tablet Take 10 mg by mouth at bedtime as needed.         No current facility-administered medications on file prior to visit.      Review of Systems System review is negative for any constitutional, cardiac, pulmonary, GI or neuro symptoms or complaints other than as described in the HPI.     Objective:   Physical Exam Filed Vitals:   06/29/13 1100  BP: 170/60  Pulse: 65  Temp: 97.5 F (36.4 C)   Wt Readings from Last 3 Encounters:  06/29/13 129 lb (58.514 kg)  05/18/13 129 lb 12.8 oz (58.877 kg)  07/18/12 138 lb (62.596 kg)   BP Readings from Last 3 Encounters:  06/29/13 170/60  05/18/13 188/56  07/18/12 150/58   Gen'l- Very elderly white woman in no acute distress HEENT - C&S clear Neck- no JVD Pulm - good BS but no rales or  wheezes Cor- 2+ radial pulse, quiet precordium, RRR Abd- soft, BS+ Neuro - A&O x 3, able to ambulate with a walker w/o assistance.        Assessment & Plan:  1. Generalized weakness - no obvious findings on physical exam  Plan Cmet, CBCD, U/A  Addendum:  Recent Results (from the past 2160 hour(s))  URINALYSIS, ROUTINE W REFLEX MICROSCOPIC     Status: None   Collection Time    05/18/13  2:56 PM      Result Value Range   Color, Urine LT. YELLOW  Yellow;Lt. Yellow   APPearance CLEAR  Clear   Specific Gravity, Urine 1.025  1.000-1.030   pH 6.0  5.0 - 8.0   Total Protein, Urine TRACE  Negative   Urine Glucose NEGATIVE  Negative   Ketones, ur NEGATIVE  Negative   Bilirubin Urine NEGATIVE  Negative   Hgb urine dipstick MODERATE  Negative   Urobilinogen, UA 1.0  0.0 - 1.0   Leukocytes, UA MODERATE  Negative   Nitrite NEGATIVE  Negative   WBC, UA 7-10/hpf  0-2/hpf   RBC / HPF 0-2/hpf  0-2/hpf   Squamous Epithelial / LPF Many(>10/hpf)  Rare(0-4/hpf)   Bacteria, UA Rare(<10/hpf)  None  COMPREHENSIVE METABOLIC PANEL     Status: Abnormal   Collection Time    05/18/13  2:56 PM      Result Value Range   Sodium 137  135 - 145 mEq/L   Potassium 4.9  3.5 - 5.1 mEq/L   Chloride 100  96 - 112 mEq/L   CO2 30  19 - 32 mEq/L   Glucose, Bld 108 (*) 70 - 99 mg/dL   BUN 20  6 - 23 mg/dL   Creatinine, Ser 0.8  0.4 - 1.2 mg/dL   Total Bilirubin 0.7  0.3 - 1.2 mg/dL   Alkaline Phosphatase 60  39 - 117 U/L   AST 21  0 - 37 U/L   ALT 20  0 - 35 U/L   Total Protein 7.0  6.0 - 8.3 g/dL   Albumin 3.6  3.5 - 5.2 g/dL   Calcium 9.7  8.4 - 16.1 mg/dL   GFR 09.60  >45.40 mL/min  CBC WITH DIFFERENTIAL     Status: None   Collection Time    05/18/13  2:56 PM  Result Value Range   WBC 8.0  4.5 - 10.5 K/uL   RBC 5.04  3.87 - 5.11 Mil/uL   Hemoglobin 14.2  12.0 - 15.0 g/dL   HCT 91.4  78.2 - 95.6 %   MCV 83.3  78.0 - 100.0 fl   MCHC 33.9  30.0 - 36.0 g/dL   RDW 21.3  08.6 - 57.8 %    Platelets 217.0  150.0 - 400.0 K/uL   Neutrophils Relative % 63.0  43.0 - 77.0 %   Lymphocytes Relative 27.5  12.0 - 46.0 %   Monocytes Relative 7.3  3.0 - 12.0 %   Eosinophils Relative 1.3  0.0 - 5.0 %   Basophils Relative 0.9  0.0 - 3.0 %   Neutro Abs 5.1  1.4 - 7.7 K/uL   Lymphs Abs 2.2  0.7 - 4.0 K/uL   Monocytes Absolute 0.6  0.1 - 1.0 K/uL   Eosinophils Absolute 0.1  0.0 - 0.7 K/uL   Basophils Absolute 0.1  0.0 - 0.1 K/uL  HEPATIC FUNCTION PANEL     Status: Abnormal   Collection Time    06/29/13 11:55 AM      Result Value Range   Total Bilirubin 1.3 (*) 0.3 - 1.2 mg/dL   Bilirubin, Direct 0.3  0.0 - 0.3 mg/dL   Alkaline Phosphatase 62  39 - 117 U/L   AST 29  0 - 37 U/L   ALT 40 (*) 0 - 35 U/L   Total Protein 7.0  6.0 - 8.3 g/dL   Albumin 3.5  3.5 - 5.2 g/dL  TSH     Status: Abnormal   Collection Time    06/29/13 11:55 AM      Result Value Range   TSH 13.42 (*) 0.35 - 5.50 uIU/mL  COMPREHENSIVE METABOLIC PANEL     Status: Abnormal   Collection Time    06/29/13 11:55 AM      Result Value Range   Sodium 136  135 - 145 mEq/L   Potassium 4.3  3.5 - 5.1 mEq/L   Chloride 96  96 - 112 mEq/L   CO2 30  19 - 32 mEq/L   Glucose, Bld 114 (*) 70 - 99 mg/dL   BUN 20  6 - 23 mg/dL   Creatinine, Ser 1.0  0.4 - 1.2 mg/dL   Total Bilirubin 1.3 (*) 0.3 - 1.2 mg/dL   Alkaline Phosphatase 62  39 - 117 U/L   AST 29  0 - 37 U/L   ALT 40 (*) 0 - 35 U/L   Total Protein 7.0  6.0 - 8.3 g/dL   Albumin 3.5  3.5 - 5.2 g/dL   Calcium 9.2  8.4 - 46.9 mg/dL   GFR 62.95 (*) >28.41 mL/min  CBC WITH DIFFERENTIAL     Status: Abnormal   Collection Time    06/29/13 11:55 AM      Result Value Range   WBC 11.0 (*) 4.5 - 10.5 K/uL   RBC 5.12 (*) 3.87 - 5.11 Mil/uL   Hemoglobin 14.7  12.0 - 15.0 g/dL   HCT 32.4  40.1 - 02.7 %   MCV 86.6  78.0 - 100.0 fl   MCHC 33.2  30.0 - 36.0 g/dL   RDW 25.3  66.4 - 40.3 %   Platelets 230.0  150.0 - 400.0 K/uL   Neutrophils Relative % 81.9 (*) 43.0 - 77.0 %    Lymphocytes Relative 11.5 (*) 12.0 - 46.0 %   Monocytes Relative 5.9  3.0 - 12.0 %   Eosinophils Relative 0.4  0.0 - 5.0 %   Basophils Relative 0.3  0.0 - 3.0 %   Neutro Abs 9.0 (*) 1.4 - 7.7 K/uL   Lymphs Abs 1.3  0.7 - 4.0 K/uL   Monocytes Absolute 0.6  0.1 - 1.0 K/uL   Eosinophils Absolute 0.0  0.0 - 0.7 K/uL   Basophils Absolute 0.0  0.0 - 0.1 K/uL  BRAIN NATRIURETIC PEPTIDE     Status: Abnormal   Collection Time    06/29/13 11:55 AM      Result Value Range   Pro B Natriuretic peptide (BNP) 635.0 (*) 0.0 - 100.0 pg/mL  URINALYSIS, ROUTINE W REFLEX MICROSCOPIC     Status: None   Collection Time    06/29/13 11:55 AM      Result Value Range   Color, Urine LT. YELLOW  Yellow;Lt. Yellow   APPearance CLEAR  Clear   Specific Gravity, Urine 1.020  1.000-1.030   pH 6.0  5.0 - 8.0   Total Protein, Urine NEGATIVE  Negative   Urine Glucose NEGATIVE  Negative   Ketones, ur NEGATIVE  Negative   Bilirubin Urine NEGATIVE  Negative   Hgb urine dipstick TRACE-INTACT  Negative   Urobilinogen, UA 0.2  0.0 - 1.0   Leukocytes, UA MODERATE  Negative   Nitrite NEGATIVE  Negative   WBC, UA 21-50/hpf  0-2/hpf   RBC / HPF 0-2/hpf  0-2/hpf   Squamous Epithelial / LPF Many(>10/hpf)  Rare(0-4/hpf)   Renal Epithel, UA Rare(0-4/hpf)  None   Bacteria, UA Many(>50/hpf)  None   Granular Casts, UA Presence of  None   Hyaline Casts, UA Presence of  None   Evidence of Urinary track infection with positive U/A and leukocytosis with left shift  Plan Started on Avelox 400 mg daily x 7  2. Shortness of breath - exam unrevealing but still with concern for CHF.  Plan CXR  BNP  Addendum: mildly elevated BNP                    CXR: IMPRESSION:  Mild patchy bilateral lower lobe opacities, suspicious for  pneumonia.  Plan Avelox 400 mg daily x 7  Supportive care  Note: although she feels bad she does not appear critically ill and outpatient therapy is reasonable. She is carefully instructed to call for  worsening symptoms - fever, increased SOB, inability to keep down food/fluids/medications. Will need close follow-up.

## 2013-07-03 ENCOUNTER — Emergency Department (HOSPITAL_COMMUNITY): Payer: Medicare PPO

## 2013-07-03 ENCOUNTER — Inpatient Hospital Stay (HOSPITAL_COMMUNITY)
Admission: EM | Admit: 2013-07-03 | Discharge: 2013-07-05 | DRG: 194 | Disposition: A | Discharge status: Nursing Home (Includes ICF) | Payer: Medicare PPO | Attending: Internal Medicine | Admitting: Internal Medicine

## 2013-07-03 ENCOUNTER — Encounter (HOSPITAL_COMMUNITY): Payer: Self-pay | Admitting: Emergency Medicine

## 2013-07-03 DIAGNOSIS — J189 Pneumonia, unspecified organism: Principal | ICD-10-CM

## 2013-07-03 DIAGNOSIS — Z79899 Other long term (current) drug therapy: Secondary | ICD-10-CM

## 2013-07-03 DIAGNOSIS — I739 Peripheral vascular disease, unspecified: Secondary | ICD-10-CM | POA: Diagnosis present

## 2013-07-03 DIAGNOSIS — J4489 Other specified chronic obstructive pulmonary disease: Secondary | ICD-10-CM | POA: Diagnosis present

## 2013-07-03 DIAGNOSIS — E785 Hyperlipidemia, unspecified: Secondary | ICD-10-CM | POA: Diagnosis present

## 2013-07-03 DIAGNOSIS — N390 Urinary tract infection, site not specified: Secondary | ICD-10-CM

## 2013-07-03 DIAGNOSIS — I252 Old myocardial infarction: Secondary | ICD-10-CM

## 2013-07-03 DIAGNOSIS — I10 Essential (primary) hypertension: Secondary | ICD-10-CM

## 2013-07-03 DIAGNOSIS — F411 Generalized anxiety disorder: Secondary | ICD-10-CM | POA: Diagnosis present

## 2013-07-03 DIAGNOSIS — D72829 Elevated white blood cell count, unspecified: Secondary | ICD-10-CM | POA: Diagnosis present

## 2013-07-03 DIAGNOSIS — I509 Heart failure, unspecified: Secondary | ICD-10-CM | POA: Diagnosis present

## 2013-07-03 DIAGNOSIS — J449 Chronic obstructive pulmonary disease, unspecified: Secondary | ICD-10-CM | POA: Diagnosis present

## 2013-07-03 DIAGNOSIS — I4891 Unspecified atrial fibrillation: Secondary | ICD-10-CM

## 2013-07-03 DIAGNOSIS — I2510 Atherosclerotic heart disease of native coronary artery without angina pectoris: Secondary | ICD-10-CM | POA: Diagnosis present

## 2013-07-03 DIAGNOSIS — I7789 Other specified disorders of arteries and arterioles: Secondary | ICD-10-CM | POA: Diagnosis present

## 2013-07-03 LAB — TROPONIN I: Troponin I: <0.3 ng/mL (ref <0.30)

## 2013-07-03 LAB — URINALYSIS, ROUTINE W REFLEX MICROSCOPIC
Glucose, UA: NEGATIVE mg/dL
Protein, ur: 100 mg/dL — AB
Specific Gravity, Urine: 1.02 (ref 1.005–1.030)
Urobilinogen, UA: 0.2 mg/dL (ref 0.0–1.0)

## 2013-07-03 LAB — COMPREHENSIVE METABOLIC PANEL
Albumin: 3.2 g/dL — ABNORMAL LOW (ref 3.5–5.2)
BUN: 17 mg/dL (ref 6–23)
Calcium: 9.8 mg/dL (ref 8.4–10.5)
Chloride: 101 mEq/L (ref 96–112)
Creatinine, Ser: 0.75 mg/dL (ref 0.50–1.10)
GFR calc non Af Amer: 69 mL/min — ABNORMAL LOW (ref >90)
Total Bilirubin: 0.4 mg/dL (ref 0.3–1.2)

## 2013-07-03 LAB — URINE MICROSCOPIC-ADD ON

## 2013-07-03 LAB — CBC WITH DIFFERENTIAL/PLATELET
Basophils Absolute: 0 10*3/uL (ref 0.0–0.1)
Basophils Relative: 0 % (ref 0–1)
Eosinophils Relative: 1 % (ref 0–5)
HCT: 43.4 % (ref 36.0–46.0)
Hemoglobin: 14.2 g/dL (ref 12.0–15.0)
MCH: 27.9 pg (ref 26.0–34.0)
MCHC: 32.7 g/dL (ref 30.0–36.0)
MCV: 85.3 fL (ref 78.0–100.0)
Monocytes Absolute: 0.7 10*3/uL (ref 0.1–1.0)
Monocytes Relative: 8 % (ref 3–12)
Neutro Abs: 6.3 10*3/uL (ref 1.7–7.7)
RDW: 13.9 % (ref 11.5–15.5)

## 2013-07-03 MED ORDER — ONDANSETRON HCL 4 MG PO TABS: 4.0000 mg | ORAL_TABLET | Freq: Four times a day (QID) | ORAL | Status: DC | PRN

## 2013-07-03 MED ORDER — FAMOTIDINE 20 MG PO TABS
20.0000 mg | ORAL_TABLET | Freq: Two times a day (BID) | ORAL | Status: DC
  Administered 2013-07-03 – 2013-07-04 (×3): 20 mg via ORAL
  Filled 2013-07-03 (×5): qty 1

## 2013-07-03 MED ORDER — DEXTROSE 5 % IV SOLN
1.0000 g | Freq: Once | INTRAVENOUS | Status: AC
  Administered 2013-07-03: 1 g via INTRAVENOUS
  Filled 2013-07-03: qty 10

## 2013-07-03 MED ORDER — SENNA 8.6 MG PO TABS
1.0000 | ORAL_TABLET | Freq: Two times a day (BID) | ORAL | Status: DC
  Administered 2013-07-03 – 2013-07-04 (×2): 8.6 mg via ORAL
  Filled 2013-07-03 (×2): qty 1

## 2013-07-03 MED ORDER — CILOSTAZOL 100 MG PO TABS
100.0000 mg | ORAL_TABLET | Freq: Two times a day (BID) | ORAL | Status: DC
Start: 2013-07-03 — End: 2013-07-05
  Administered 2013-07-03 – 2013-07-04 (×3): 100 mg via ORAL
  Filled 2013-07-03 (×5): qty 1

## 2013-07-03 MED ORDER — DILTIAZEM HCL ER BEADS 240 MG PO CP24
240.0000 mg | ORAL_CAPSULE | Freq: Every day | ORAL | Status: DC
  Administered 2013-07-04: 240 mg via ORAL
  Filled 2013-07-03 (×2): qty 1

## 2013-07-03 MED ORDER — FUROSEMIDE 40 MG PO TABS
40.0000 mg | ORAL_TABLET | Freq: Every day | ORAL | Status: DC
  Filled 2013-07-03: qty 1

## 2013-07-03 MED ORDER — CLOPIDOGREL BISULFATE 75 MG PO TABS
75.0000 mg | ORAL_TABLET | Freq: Every day | ORAL | Status: DC
  Administered 2013-07-04: 75 mg via ORAL
  Filled 2013-07-03 (×3): qty 1

## 2013-07-03 MED ORDER — ACETAMINOPHEN 325 MG PO TABS: 650.0000 mg | ORAL_TABLET | Freq: Four times a day (QID) | ORAL | Status: DC | PRN

## 2013-07-03 MED ORDER — ATENOLOL 100 MG PO TABS
100.0000 mg | ORAL_TABLET | Freq: Every day | ORAL | Status: DC
  Administered 2013-07-04: 100 mg via ORAL
  Filled 2013-07-03 (×2): qty 1

## 2013-07-03 MED ORDER — DEXTROSE 5 % IV SOLN: 1.0000 g | INTRAVENOUS | Status: DC

## 2013-07-03 MED ORDER — ONDANSETRON HCL 4 MG/2ML IJ SOLN
4.0000 mg | Freq: Four times a day (QID) | INTRAMUSCULAR | Status: DC | PRN
  Administered 2013-07-03: 4 mg via INTRAVENOUS

## 2013-07-03 MED ORDER — ENOXAPARIN SODIUM 40 MG/0.4ML ~~LOC~~ SOLN
40.0000 mg | SUBCUTANEOUS | Status: DC
  Administered 2013-07-03 – 2013-07-04 (×2): 40 mg via SUBCUTANEOUS
  Filled 2013-07-03 (×3): qty 0.4

## 2013-07-03 MED ORDER — ACETAMINOPHEN 650 MG RE SUPP: 650.0000 mg | Freq: Four times a day (QID) | RECTAL | Status: DC | PRN

## 2013-07-03 MED ORDER — SODIUM CHLORIDE 0.9 % IV BOLUS (SEPSIS)
500.0000 mL | Freq: Once | INTRAVENOUS | Status: AC
  Administered 2013-07-03: 1000 mL via INTRAVENOUS

## 2013-07-03 MED ORDER — ACETAMINOPHEN 500 MG PO TABS: 500.0000 mg | ORAL_TABLET | Freq: Four times a day (QID) | ORAL | Status: DC | PRN

## 2013-07-03 MED ORDER — SODIUM CHLORIDE 0.45 % IV SOLN
50.0000 mL/h | INTRAVENOUS | Status: DC
  Administered 2013-07-03: 50 mL/h via INTRAVENOUS

## 2013-07-03 NOTE — H&P (Signed)
[name] is an 79 y.o. female.   Chief Complaint: Increasing shortness of breath and weakness HPI: [name] presented for a 3-4 day h/o feeling short of breath, no focal pain, she has been bedbound due to weakness, poor appetite, no nausea, has felt feverish, no rigors, no diarrhea, no dysuria, no chest pain or pressure, no swollen glands. She admits to rhinorrhea, cough with white sputum, no post-nasal drainage. Office work up revealed both UTI and pneumonia by chest x-ray, minimal leukocytosis with left shift. She was started on Avelox and supportive care. Telephone call Sunday, 07/02/13 - she reported that she was weak but was doing OK. She calls today reporting she is worse and cannot manage at home. She is admitted directly from home to in-patient bed.    Past Medical History  Diagnosis Date  . CAD (coronary artery disease)   . Hyperlipidemia   . Osteoporosis   . PVD (peripheral vascular disease)   . HTN (hypertension)   . Diverticulosis of colon   . GERD (gastroesophageal reflux disease)   . Hip fracture, right   . Decubitus ulcer     Right heel  . COPD (chronic obstructive pulmonary disease)   . Anxiety   . Anemia   . Esophageal stricture   . CHF (congestive heart failure)   . Atrial fibrillation   . Fibromuscular dysplasia     Renal artery  . Myocardial infarction     Past Surgical History  Procedure Laterality Date  . Ptca  04/2003  . Cataract extraction      Bilateral  . Orif acetabular fracture  2009    No family history on file. Social History:  reports that she has never smoked. She has never used smokeless tobacco. She reports that she does not drink alcohol or use illicit drugs. Married '36 - '43 Divorced: Married '46 - '82 widowed . Lives alone, grandson lives w/her. I-ADLs, daughter is close at hand. END of Life Care: DNR, DNI, No heroic or futile measures    Allergies:  Allergies  Allergen Reactions  . Codeine Phosphate     REACTION: unspecified  .  Doxycycline Other (See Comments)    unknown  . Erythromycin Other (See Comments)    unknown    No current facility-administered medications on file prior to encounter.   Current Outpatient Prescriptions on File Prior to Encounter  Medication Sig Dispense Refill  . aspirin 325 MG tablet Take 325 mg by mouth daily.        Marland Kitchen atenolol (TENORMIN) 100 MG tablet TAKE 1 TABLET BY MOUTH DAILY  30 tablet  2  . atenolol (TENORMIN) 50 MG tablet Take 1 tablet (50 mg total) by mouth daily.  30 tablet  11  . cilostazol (PLETAL) 100 MG tablet Take 100 mg by mouth 2 (two) times daily.      . clopidogrel (PLAVIX) 75 MG tablet TAKE 1 TABLET BY MOUTH EVERY DAY  30 tablet  5  . diltiazem (TIAZAC) 240 MG 24 hr capsule TAKE ONE CAPSULE BY MOUTH EVERY DAY  30 capsule  5  . fluticasone (FLONASE) 50 MCG/ACT nasal spray Place 1 spray into the nose daily. Each nostril  16 g  5  . furosemide (LASIX) 20 MG tablet Take 1 tablet (20 mg total) by mouth daily.  30 tablet  11  . furosemide (LASIX) 40 MG tablet TAKE 1 TABLET BY MOUTH EVERY DAY  30 tablet  5  . moxifloxacin (AVELOX) 400 MG tablet Take  1 tablet (400 mg total) by mouth daily.  7 tablet  0  . ranitidine (ZANTAC) 150 MG tablet TAKE 1 TABLET BY MOUTH TWICE A DAY FOR STOMACH  60 tablet  5  . ranitidine (ZANTAC) 150 MG tablet TAKE 1 TABLET BY MOUTH TWICE A DAY  60 tablet  3  . sulfamethoxazole-trimethoprim (BACTRIM DS,SEPTRA DS) 800-160 MG per tablet Take 1 tablet by mouth 2 (two) times daily.  10 tablet  0  . triamcinolone cream (KENALOG) 0.1 % APPLY TWICE A DAY  30 g  3  . [DISCONTINUED] zolpidem (AMBIEN) 10 MG tablet Take 10 mg by mouth at bedtime as needed.         Recent Results (from the past 2160 hour(s))  URINALYSIS, ROUTINE W REFLEX MICROSCOPIC     Status: None   Collection Time    05/18/13  2:56 PM      Result Value Range   Color, Urine LT. YELLOW  Yellow;Lt. Yellow   APPearance CLEAR  Clear   Specific Gravity, Urine 1.025  1.000-1.030   pH 6.0  5.0  - 8.0   Total Protein, Urine TRACE  Negative   Urine Glucose NEGATIVE  Negative   Ketones, ur NEGATIVE  Negative   Bilirubin Urine NEGATIVE  Negative   Hgb urine dipstick MODERATE  Negative   Urobilinogen, UA 1.0  0.0 - 1.0   Leukocytes, UA MODERATE  Negative   Nitrite NEGATIVE  Negative   WBC, UA 7-10/hpf  0-2/hpf   RBC / HPF 0-2/hpf  0-2/hpf   Squamous Epithelial / LPF Many(>10/hpf)  Rare(0-4/hpf)   Bacteria, UA Rare(<10/hpf)  None  COMPREHENSIVE METABOLIC PANEL     Status: Abnormal   Collection Time    05/18/13  2:56 PM      Result Value Range   Sodium 137  135 - 145 mEq/L   Potassium 4.9  3.5 - 5.1 mEq/L   Chloride 100  96 - 112 mEq/L   CO2 30  19 - 32 mEq/L   Glucose, Bld 108 (*) 70 - 99 mg/dL   BUN 20  6 - 23 mg/dL   Creatinine, Ser 0.8  0.4 - 1.2 mg/dL   Total Bilirubin 0.7  0.3 - 1.2 mg/dL   Alkaline Phosphatase 60  39 - 117 U/L   AST 21  0 - 37 U/L   ALT 20  0 - 35 U/L   Total Protein 7.0  6.0 - 8.3 g/dL   Albumin 3.6  3.5 - 5.2 g/dL   Calcium 9.7  8.4 - 09.8 mg/dL   GFR 11.91  >47.82 mL/min  CBC WITH DIFFERENTIAL     Status: None   Collection Time    05/18/13  2:56 PM      Result Value Range   WBC 8.0  4.5 - 10.5 K/uL   RBC 5.04  3.87 - 5.11 Mil/uL   Hemoglobin 14.2  12.0 - 15.0 g/dL   HCT 95.6  21.3 - 08.6 %   MCV 83.3  78.0 - 100.0 fl   MCHC 33.9  30.0 - 36.0 g/dL   RDW 57.8  46.9 - 62.9 %   Platelets 217.0  150.0 - 400.0 K/uL   Neutrophils Relative % 63.0  43.0 - 77.0 %   Lymphocytes Relative 27.5  12.0 - 46.0 %   Monocytes Relative 7.3  3.0 - 12.0 %   Eosinophils Relative 1.3  0.0 - 5.0 %   Basophils Relative 0.9  0.0 - 3.0 %  Neutro Abs 5.1  1.4 - 7.7 K/uL   Lymphs Abs 2.2  0.7 - 4.0 K/uL   Monocytes Absolute 0.6  0.1 - 1.0 K/uL   Eosinophils Absolute 0.1  0.0 - 0.7 K/uL   Basophils Absolute 0.1  0.0 - 0.1 K/uL  HEPATIC FUNCTION PANEL     Status: Abnormal   Collection Time    06/29/13 11:55 AM      Result Value Range   Total Bilirubin 1.3 (*)  0.3 - 1.2 mg/dL   Bilirubin, Direct 0.3  0.0 - 0.3 mg/dL   Alkaline Phosphatase 62  39 - 117 U/L   AST 29  0 - 37 U/L   ALT 40 (*) 0 - 35 U/L   Total Protein 7.0  6.0 - 8.3 g/dL   Albumin 3.5  3.5 - 5.2 g/dL  TSH     Status: Abnormal   Collection Time    06/29/13 11:55 AM      Result Value Range   TSH 13.42 (*) 0.35 - 5.50 uIU/mL  COMPREHENSIVE METABOLIC PANEL     Status: Abnormal   Collection Time    06/29/13 11:55 AM      Result Value Range   Sodium 136  135 - 145 mEq/L   Potassium 4.3  3.5 - 5.1 mEq/L   Chloride 96  96 - 112 mEq/L   CO2 30  19 - 32 mEq/L   Glucose, Bld 114 (*) 70 - 99 mg/dL   BUN 20  6 - 23 mg/dL   Creatinine, Ser 1.0  0.4 - 1.2 mg/dL   Total Bilirubin 1.3 (*) 0.3 - 1.2 mg/dL   Alkaline Phosphatase 62  39 - 117 U/L   AST 29  0 - 37 U/L   ALT 40 (*) 0 - 35 U/L   Total Protein 7.0  6.0 - 8.3 g/dL   Albumin 3.5  3.5 - 5.2 g/dL   Calcium 9.2  8.4 - 40.9 mg/dL   GFR 81.19 (*) >14.78 mL/min  CBC WITH DIFFERENTIAL     Status: Abnormal   Collection Time    06/29/13 11:55 AM      Result Value Range   WBC 11.0 (*) 4.5 - 10.5 K/uL   RBC 5.12 (*) 3.87 - 5.11 Mil/uL   Hemoglobin 14.7  12.0 - 15.0 g/dL   HCT 29.5  62.1 - 30.8 %   MCV 86.6  78.0 - 100.0 fl   MCHC 33.2  30.0 - 36.0 g/dL   RDW 65.7  84.6 - 96.2 %   Platelets 230.0  150.0 - 400.0 K/uL   Neutrophils Relative % 81.9 (*) 43.0 - 77.0 %   Lymphocytes Relative 11.5 (*) 12.0 - 46.0 %   Monocytes Relative 5.9  3.0 - 12.0 %   Eosinophils Relative 0.4  0.0 - 5.0 %   Basophils Relative 0.3  0.0 - 3.0 %   Neutro Abs 9.0 (*) 1.4 - 7.7 K/uL   Lymphs Abs 1.3  0.7 - 4.0 K/uL   Monocytes Absolute 0.6  0.1 - 1.0 K/uL   Eosinophils Absolute 0.0  0.0 - 0.7 K/uL   Basophils Absolute 0.0  0.0 - 0.1 K/uL  BRAIN NATRIURETIC PEPTIDE     Status: Abnormal   Collection Time    06/29/13 11:55 AM      Result Value Range   Pro B Natriuretic peptide (BNP) 635.0 (*) 0.0 - 100.0 pg/mL  URINALYSIS, ROUTINE W REFLEX  MICROSCOPIC     Status: None  Collection Time    06/29/13 11:55 AM      Result Value Range   Color, Urine LT. YELLOW  Yellow;Lt. Yellow   APPearance CLEAR  Clear   Specific Gravity, Urine 1.020  1.000-1.030   pH 6.0  5.0 - 8.0   Total Protein, Urine NEGATIVE  Negative   Urine Glucose NEGATIVE  Negative   Ketones, ur NEGATIVE  Negative   Bilirubin Urine NEGATIVE  Negative   Hgb urine dipstick TRACE-INTACT  Negative   Urobilinogen, UA 0.2  0.0 - 1.0   Leukocytes, UA MODERATE  Negative   Nitrite NEGATIVE  Negative   WBC, UA 21-50/hpf  0-2/hpf   RBC / HPF 0-2/hpf  0-2/hpf   Squamous Epithelial / LPF Many(>10/hpf)  Rare(0-4/hpf)   Renal Epithel, UA Rare(0-4/hpf)  None   Bacteria, UA Many(>50/hpf)  None   Granular Casts, UA Presence of  None   Hyaline Casts, UA Presence of  None   Imaging: CXR  July 10, '14 Findings: Mild patchy bilateral lower lobe opacities, suspicious  for pneumonia.  Underlying chronic interstitial markings/emphysematous changes. No  pleural effusion or pneumothorax.  Mild cardiomegaly.  Degenerative changes of the visualized thoracolumbar spine.  IMPRESSION:  Mild patchy bilateral lower lobe opacities, suspicious for  pneumonia.     Review of Systems  Constitutional: Negative for fever, chills and weight loss.  HENT: Negative.  Negative for neck pain.   Eyes: Negative.   Respiratory: Positive for cough and shortness of breath. Negative for hemoptysis and sputum production.   Cardiovascular: Negative.   Gastrointestinal: Positive for nausea and diarrhea. Negative for heartburn, vomiting, blood in stool and melena.  Genitourinary: Positive for frequency. Negative for dysuria, urgency and hematuria.  Musculoskeletal: Positive for myalgias. Negative for back pain, joint pain and falls.  Skin: Negative.   Neurological: Negative for dizziness, tremors, focal weakness, seizures and weakness.  Endo/Heme/Allergies: Bruises/bleeds easily.   Psychiatric/Behavioral: Negative.      Physical Exam  No vitals available Gen'l - very elderly white woman in no acute distress HEENT - C&S clear Cor 2+ radial, IRIR Pulm - Good air movement no rales, wheezes, no increased WOB Abd - soft BS+  No guarding or rebound Neuro - Alert, awake , oriented to person, place and examiner Assessment/Plan 1. ID - patient has been taking avelox since July 10 for PNA and UTI. Her labs today with a normal WBCand normal diff. She is not hot to touch. Original order, lost with IT snafu, is lost but she has received rocephin 1 g IV  Plan Continue IV rocephin.  Blood cultures are pending.  2. Cardiac - her heart rate is controlled and she is hemodynamically stable. Question of mild dehydration  Plan 1/2 NS at 50 cc/hr  3. Code status - limited code.   Casimiro Needle Montina Dorrance 07/03/2013, 1:53 PM

## 2013-07-03 NOTE — ED Provider Notes (Signed)
History    CSN: [id number] Arrival date & time 07/03/13  1545  First MD Initiated Contact with Patient 07/03/13 1550     Chief Complaint  Patient presents with  . Nausea  . Shortness of Breath  . Weakness   (Consider location/radiation/quality/duration/timing/severity/associated sxs/prior Treatment) HPI Comments: 79 yo female with recent treatment for outpt pneumonia presents with weakness, nausea and cough.  Sent in by pcp for admission.  GraDUALLY worsening.  No cp.  No O2 at home. No NH or recent hospitalization.  No lung dz.  Pt lives at home with help.  Patient is a 79 y.o. female presenting with shortness of breath and weakness. The history is provided by the patient.  Shortness of Breath Severity:  Mild Associated symptoms: cough   Associated symptoms: no abdominal pain, no chest pain, no fever, no headaches, no neck pain and no rash   Weakness Associated symptoms include shortness of breath. Pertinent negatives include no chest pain, no abdominal pain and no headaches.   Past Medical History  Diagnosis Date  . CAD (coronary artery disease)   . Hyperlipidemia   . Osteoporosis   . PVD (peripheral vascular disease)   . HTN (hypertension)   . Diverticulosis of colon   . GERD (gastroesophageal reflux disease)   . Hip fracture, right   . Decubitus ulcer     Right heel  . COPD (chronic obstructive pulmonary disease)   . Anxiety   . Anemia   . Esophageal stricture   . CHF (congestive heart failure)   . Atrial fibrillation   . Fibromuscular dysplasia     Renal artery  . Myocardial infarction    Past Surgical History  Procedure Laterality Date  . Ptca  04/2003  . Cataract extraction      Bilateral  . Orif acetabular fracture  2009   No family history on file. History  Substance Use Topics  . Smoking status: Never Smoker   . Smokeless tobacco: Never Used  . Alcohol Use: No   OB History   Grav Para Term Preterm Abortions TAB SAB Ect Mult Living                  Review of Systems  Constitutional: Positive for chills, appetite change and fatigue. Negative for fever.  HENT: Negative for neck pain.   Eyes: Negative for visual disturbance.  Respiratory: Positive for cough and shortness of breath.   Cardiovascular: Negative for chest pain.  Gastrointestinal: Positive for nausea. Negative for abdominal pain.  Genitourinary: Negative for dysuria.  Musculoskeletal: Negative for back pain.  Skin: Negative for rash.  Neurological: Positive for weakness. Negative for light-headedness and headaches.    Allergies  Codeine phosphate; Doxycycline; and Erythromycin  Home Medications   Current Outpatient Rx  Name  Route  Sig  Dispense  Refill  . acetaminophen (TYLENOL) 500 MG tablet   Oral   Take 500 mg by mouth every 6 (six) hours as needed for pain.         Marland Kitchen aspirin 325 MG tablet   Oral   Take 325 mg by mouth daily.           Marland Kitchen atenolol (TENORMIN) 100 MG tablet   Oral   Take 100 mg by mouth daily.         . clopidogrel (PLAVIX) 75 MG tablet   Oral   Take 75 mg by mouth daily.         Marland Kitchen diltiazem (TIAZAC) 240 MG  24 hr capsule   Oral   Take 240 mg by mouth daily.         . fluticasone (FLONASE) 50 MCG/ACT nasal spray   Nasal   Place 2 sprays into the nose daily as needed for rhinitis or allergies.         . furosemide (LASIX) 40 MG tablet   Oral   Take 40 mg by mouth.         . moxifloxacin (AVELOX) 400 MG tablet   Oral   Take 1 tablet (400 mg total) by mouth daily.   7 tablet   0   . ranitidine (ZANTAC) 150 MG tablet   Oral   Take 150 mg by mouth 2 (two) times daily as needed for heartburn.         . triamcinolone cream (KENALOG) 0.1 %   Topical   Apply 1 application topically 2 (two) times daily as needed (skin irritation).         . EXPIRED: atenolol (TENORMIN) 50 MG tablet   Oral   Take 1 tablet (50 mg total) by mouth daily.   30 tablet   11   . cilostazol (PLETAL) 100 MG tablet   Oral   Take  100 mg by mouth 2 (two) times daily.         Marland Kitchen EXPIRED: furosemide (LASIX) 20 MG tablet   Oral   Take 1 tablet (20 mg total) by mouth daily.   30 tablet   11    BP 180/50  Pulse 54  Temp(Src) 97.9 F (36.6 C) (Oral)  Resp 17  SpO2 96% Physical Exam  Nursing note and vitals reviewed. Constitutional: She is oriented to person, place, and time. She appears well-developed and well-nourished.  HENT:  Head: Normocephalic and atraumatic.  Eyes: Conjunctivae are normal. Right eye exhibits no discharge. Left eye exhibits no discharge.  Neck: Normal range of motion. Neck supple. No tracheal deviation present.  Cardiovascular: Normal rate and regular rhythm.   Pulmonary/Chest: Effort normal. She has rales (lower worse on right).  Abdominal: Soft. She exhibits no distension. There is no tenderness. There is no guarding.  Musculoskeletal: She exhibits no edema and no tenderness.  Neurological: She is alert and oriented to person, place, and time.  Skin: Skin is warm. No rash noted.  Psychiatric: She has a normal mood and affect.    ED Course  Procedures (including critical care time) Labs Reviewed  CULTURE, BLOOD (ROUTINE X 2)  CULTURE, BLOOD (ROUTINE X 2)  CBC WITH DIFFERENTIAL  COMPREHENSIVE METABOLIC PANEL  URINALYSIS, ROUTINE W REFLEX MICROSCOPIC  TROPONIN I  LACTIC ACID, PLASMA   No results found. No diagnosis found.  MDM  Clinically pneumonia, community aquired.  Abx, cultures.   Pt 90% on RA. Rechecked, comfortable.  PCP came to ED pt was supposed to be direct admit, he evaluated.  Care in ED was done.   Labs Reviewed  CULTURE, BLOOD (ROUTINE X 2)  CULTURE, BLOOD (ROUTINE X 2)  CBC WITH DIFFERENTIAL  COMPREHENSIVE METABOLIC PANEL  URINALYSIS, ROUTINE W REFLEX MICROSCOPIC  TROPONIN I  LACTIC ACID, PLASMA     Enid Skeens, MD 07/03/13 1735

## 2013-07-03 NOTE — Progress Notes (Signed)
EDCM spoke to patient and patient's daughter Mickeal Skinner at bedside.  EDCM asked patient if she would possibly need home health services such as a visiting RN, PT, OT etc.  Patient immediately stated, "No, I am perfectly fine."  Patient reports that her grandson lives with her.  She went on to say that he helps with her groceries, medications etc.  "He helps me with everything."  Patient reports that she has a walker.  "I get along just fine with my walker."  Patient also reports that she has not had any falls.  No further needs at this time.

## 2013-07-03 NOTE — ED Notes (Signed)
Per EMS pt comes from home where she c/o "feeling sick".  Pt was diagnosed with PNA last week and today was nauseated, weak and short of breath.  Pt on 4L O2 via [place].  Per EMS they were dispatched by [place] that pt needs to go to hospital and going to be admitted.

## 2013-07-04 ENCOUNTER — Inpatient Hospital Stay (HOSPITAL_COMMUNITY): Payer: Medicare PPO

## 2013-07-04 LAB — CBC
MCHC: 32.4 g/dL (ref 30.0–36.0)
RDW: 14.1 % (ref 11.5–15.5)

## 2013-07-04 LAB — BASIC METABOLIC PANEL
BUN: 15 mg/dL (ref 6–23)
Chloride: 101 mEq/L (ref 96–112)
Creatinine, Ser: 0.85 mg/dL (ref 0.50–1.10)
GFR calc Af Amer: 65 mL/min — ABNORMAL LOW (ref >90)

## 2013-07-04 MED ORDER — ENSURE COMPLETE PO LIQD
237.0000 mL | Freq: Two times a day (BID) | ORAL | Status: DC
  Administered 2013-07-04: 237 mL via ORAL

## 2013-07-04 MED ORDER — CHLORHEXIDINE GLUCONATE CLOTH 2 % EX PADS
6.0000 | MEDICATED_PAD | Freq: Every day | CUTANEOUS | Status: DC
  Administered 2013-07-04: 6 via TOPICAL

## 2013-07-04 MED ORDER — SODIUM CHLORIDE 0.45 % IV SOLN
INTRAVENOUS | Status: DC
  Administered 2013-07-05: 04:00:00 via INTRAVENOUS

## 2013-07-04 MED ORDER — MUPIROCIN 2 % EX OINT
1.0000 "application " | TOPICAL_OINTMENT | Freq: Two times a day (BID) | CUTANEOUS | Status: DC
  Administered 2013-07-04: 1 via NASAL
  Filled 2013-07-04: qty 22

## 2013-07-04 MED ORDER — CLONIDINE HCL 0.1 MG PO TABS
0.1000 mg | ORAL_TABLET | ORAL | Status: DC | PRN
  Filled 2013-07-04: qty 1

## 2013-07-04 MED ORDER — FUROSEMIDE 40 MG PO TABS
40.0000 mg | ORAL_TABLET | Freq: Two times a day (BID) | ORAL | Status: DC
  Administered 2013-07-04 (×2): 40 mg via ORAL
  Filled 2013-07-04 (×5): qty 1

## 2013-07-04 MED ORDER — AZITHROMYCIN 500 MG PO TABS: 500.0000 mg | ORAL_TABLET | Freq: Every day | ORAL | Status: DC

## 2013-07-04 MED ORDER — CLONIDINE HCL ER 0.1 MG PO TB12: 0.1000 mg | ORAL_TABLET | ORAL | Status: DC | PRN

## 2013-07-04 MED ORDER — LEVOFLOXACIN 750 MG PO TABS
750.0000 mg | ORAL_TABLET | Freq: Every day | ORAL | Status: DC
  Administered 2013-07-04: 750 mg via ORAL
  Filled 2013-07-04 (×2): qty 1

## 2013-07-04 NOTE — Progress Notes (Signed)
Patient's MRSA PCR came back positive.  Initiating positive MRSA standing orders.  Patient informed.

## 2013-07-04 NOTE — Progress Notes (Signed)
At 1400, patient's oxygen saturation was checked on RA at rest and with ambulation and result was 93%. Oxygen remained off for the rest of the afternoon and spot checked her at 1810 which ranged from 93-95%.  Will continue to monitor.

## 2013-07-04 NOTE — Progress Notes (Signed)
Visited with patient today. Had prayer, offered support and encouragement.and listened.

## 2013-07-04 NOTE — Care Management Note (Addendum)
    Page 1 of 1   07/05/2013     10:20:51 AM   CARE MANAGEMENT NOTE 07/05/2013  Patient:  WONDER, DONAWAY   Account Number:  [id number]  Date Initiated:  07/04/2013  Documentation initiated by:  Lanier Clam  Subjective/Objective Assessment:   ADMITTED W/SOB.PNA.     Action/Plan:   FROM HOME W/SON.HAS PCP,PHARMACY,RW.   Anticipated DC Date:  07/05/2013   Anticipated DC Plan:  HOME/SELF CARE      DC Planning Services  CM consult      Choice offered to / List presented to:             Status of service:  Completed, signed off Medicare Important Message given?   (If response is "NO", the following Medicare IM given date fields will be blank) Date Medicare IM given:   Date Additional Medicare IM given:    Discharge Disposition:  HOME/SELF CARE  Per UR Regulation:  Reviewed for med. necessity/level of care/duration of stay  If discussed at Long Length of Stay Meetings, dates discussed:    Comments:  07/05/13 Momo Braun RN,BSN NCM [phone] D/C HOME NO NEEDS.  07/04/13 Rexann Lueras RN,BSN NCM [phone]

## 2013-07-04 NOTE — Progress Notes (Signed)
Nutrition Brief Note  Patient identified on the Malnutrition Screening Tool (MST) Report  Body mass index of 22.8 kg/(m^2). Patient meets criteria for normal weight based on current BMI.   Current diet order is regular, patient is consuming approximately 90% of meals at this time. Labs and medications reviewed. Met with pt who reports not eating anything for 3 days PTA but before then she was eating 2 meals/day and drinking 1 Ensure. Pt reports her weight has been stable. Pt had some nausea/vomiting yesterday but states this has resolved and is eating well. Will order daily Ensure Complete.    No further nutrition interventions warranted at this time. If nutrition issues arise, please consult RD.   Levon Hedger MS, RD, LDN [phone] Pager [phone] After Hours Pager

## 2013-07-04 NOTE — Progress Notes (Signed)
Pt complaining of nausea and dry heaving at bedside.  Pt not able to vomit, but continues to be sick on stomach.  Called Dr. Debby Bud and received verbal order with readback of Zofran 4mg  IV q 8hrs PRN nausea. Will continue to monitor pt.

## 2013-07-04 NOTE — Progress Notes (Signed)
Subjective: Had nausea and vomiting last night but reports that she feels better this AM and would like to go home tomorrow.   Objective: Lab:  Recent Labs  07/03/13 1640 07/04/13 0520  WBC 8.5 7.8  NEUTROABS 6.3  --   HGB 14.2 13.5  HCT 43.4 41.7  MCV 85.3 86.3  PLT 231 203    Recent Labs  07/03/13 1640 07/04/13 0520  NA 139 137  K 5.0 4.5  CL 101 101  GLUCOSE 101* 106*  BUN 17 15  CREATININE 0.75 0.85  CALCIUM 9.8 9.2   BNP 06/29/13 635  Blood cultures x 2 pending Imaging: CXR 7/14: Comparison: 06/29/2013  Findings: Small bilateral pleural effusions. Patchy bibasilar  atelectasis or infiltrates, without definite change. Low volumes  with some apparent increase in mild interstitial  edema/infiltrates. Stable cardiomegaly. Atheromatous aortic arch.  Compression fracture deformities in the upper lumbar spine as  before.  IMPRESSION:  1. Slight worsening of bilateral edema/infiltrates.  Scheduled Meds: . atenolol  100 mg Oral Daily  . cefTRIAXone (ROCEPHIN)  IV  1 g Intravenous Q24H  . cilostazol  100 mg Oral BID  . clopidogrel  75 mg Oral QAC breakfast  . diltiazem  240 mg Oral Daily  . enoxaparin (LOVENOX) injection  40 mg Subcutaneous Q24H  . famotidine  20 mg Oral BID  . furosemide  40 mg Oral Daily  . senna  1 tablet Oral BID   Continuous Infusions: . sodium chloride 50 mL/hr (07/03/13 2000)   PRN Meds:.acetaminophen, acetaminophen, acetaminophen, ondansetron (ZOFRAN) IV, ondansetron   Physical Exam: Filed Vitals:   07/04/13 0725  BP: 212/60  Pulse: 58  Temp: 98.3  Resp: 14   General - very old white woman who is surprisingly bright and chipper HEENT- normal Cor 2+ radial pulse Pulm - no increased WOB, feint rales at the left base, decreased BS at the right base Neuro - A&O x 3     Assessment/Plan: 1. CAP - very elderly patient started on Avelox July 10th with lab revealing normal WBC (age) with left shift, abnormal CXR and mild cough  with some respiratory distress. She was started on Avelox for CAP. Day of admission she called reporting difficulty with breathing and feeling worse. Direct admission was attempted but she was admitted to ED. Labs appeared to have normalized but x-ray was worse. In the ED she received rocephin 1 g IV. She has remained afebrile and feels better.   Plan Per PNA core measure directives: azithromycin 500 mg daily, continue avelox 400 mg  Follow up CXR   2. UTI - on July 10 patient also had an abnormal U/A. Avelox was presumed to be adequate coverage. She had no complaint of dysuria or other symptoms of UTI. Repeat U/A 7/14 was negative.  3. HTN - patient is having excursions of systolic hypertension.   Plan She will be continued on home medications  Will add clonidine 0.1 mg q3 prn SBP >160  4. Cardiac - known h/o CAD and CHF. On July 10, mild elevation in BNP. Chest xray with question of mild pulmonary edema  Plan Continue home medications but increase furosemide to bid for 2-3 days.    [name] [facility] (o) [phone]; (c) [phone] Call-grp - [facility]  Tele: [phone]  07/04/2013, 7:54 AM

## 2013-07-05 ENCOUNTER — Telehealth: Payer: Self-pay

## 2013-07-05 NOTE — Discharge Summary (Signed)
[name], [name]                 ACCOUNT NO.:  [id number]  MEDICAL RECORD NO.:  [id number]  LOCATION:  1411                         FACILITY:  [facility]  PHYSICIAN:  Rosalyn Gess. Faelynn Wynder, MD  DATE OF BIRTH:  1915-09-20  DATE OF ADMISSION:  07/03/2013 DATE OF DISCHARGE:  07/05/2013                              DISCHARGE SUMMARY   ADMITTING DIAGNOSIS:  Community-acquired pneumonia.  DISCHARGE DIAGNOSIS:  Community-acquired pneumonia.  CONSULTANTS:  None.  PROCEDURES: 1. Chest x-ray prior to admission on June 29, 2013, with mild patchy     bilateral lower lobe opacity suspicious for pneumonia. 2. Chest x-ray on July 03, 2013, 1747 hours which showed slight     worsening of bilateral edema and infiltrate. 3. Chest x-ray on July 04, 2013, 8:38 a.m. with stable right basilar     opacity and mild bilateral pleural effusions noted.  HISTORY OF PRESENT ILLNESS:  [name] is a 79 year old woman who presented to the office on the June 29, 2013, reporting that she "did not feel well."  She had no specific complaints, but did appear to be short of breath.  She denied any cough, but had been feeling weaker than usual.  Given her advanced age and poor immunologic response capacity, she was evaluated with laboratory including a CBC, UA, and chest x-ray. This revealed the patient to have a very mild leukocytosis with a definite left shift, chest x-ray with patchy infiltrates, positive urinary tract infection.  The patient was started on Avelox 400 mg p.o. daily and was able to return to home.  The patient was called on Sunday July 02, 2013. She reported that although she was weak, she was stable and did not feel particularly short of breath.  On Monday, day of admission, the patient called the office early in the morning to report that she was feeling very sick and she was complaining of being very short of breath and sounded breathless on the telephone.  Attempt was made to directly admit the  patient to the hospital for further treatment, but she was diverted to the emergency department.  Evaluation in the emergency department revealed chest x-ray with increased patchy infiltrates, white count was normal.  The patient was given a dose of Rocephin in the emergency department, azithromycin was not given due to allergy.  She was eventually brought to the floor.  Please see the H and P for past medical history, family history, and social history.  HOSPITAL COURSE: 1. Pneumonia.  The patient with pneumonia with increasing patchy     infiltrates, weakness, and shortness of breath.  She is very     elderly and fragile and it was felt appropriate to have her in the     hospital for treatment: oxygen and continuous evaluation.  The     patient after a dose of Rocephin in the emergency department was     started on Levaquin on July 04, 2013.  The patient has continued to     do well.  Her oxygen saturation was checked on room air and was 93%     including with ambulation.  She was monitored throughout the day on  July 04, 2013, off oxygen and continued to hold an adequate oxygen     saturation.  The patient denied any progressive shortness of breath     and did not have any cough.  She remained afebrile.    With the patient improved, with oxygen saturations being in the normal range,     with chest x-ray being stable, with the patient feeling that she     Has enough strength to return home, at this time she is     ready for discharge.  2. UTI.  The patient had urinary tract infection at the time of     admission.  This revealed that her urine had cleared.  The patient     was continued on Levaquin which would offer adequate antibiotic     Coverage.  3. Hypertension.  The patient did have significant systolic     excursions.  She was given clonidine 0.1 mg p.o. p.r.n., but did     well on this regimen.  At the time of discharge, her blood pressure     was stable 148/65.  She  will return home on her prior medications.  DISCHARGE EXAMINATION:  VITAL SIGNS:  Temperature was 98.2, blood pressure 148/65, heart rate was 79, respirations were 18, oxygen saturation on room air was 95%. GENERAL APPEARANCE:  This is a well-preserved, but elderly 79 year old woman in no distress. HEENT:  Conjunctiva and sclerae was clear.  Pupils equal, round, and reactive. CHEST:  The patient has no deformities, no CVA tenderness. LUNGS:  The patient is moving air well with no rales, wheezes, or rhonchi.  There is no increased work of breathing. CARDIOVASCULAR:  2+ radial pulse.  Her precordium was quiet.  Her heart rate was regular.  She had no JVD. ABDOMEN:  Soft.  Positive bowel sounds were noted. NEURO:  The patient is awake.  She is alert.  She is oriented.  Her speech is clear.  She understands her situation.  She feels very capable of managing herself at home.  FINAL LABORATORY:  Urinalysis from July 03, 2013, was unremarkable. Chemistry from July 04, 2013, with a sodium of 137, potassium 4.5, chloride 101, CO2 of 32, BUN of 15, creatinine 0.85, glucose was 106. White count was 7800, hemoglobin 13.5 g, platelet count 203,000.  DISCHARGE MEDICATIONS: 1. The patient will continue and complete her supply of Avelox at     home. 2. She will continue with acetaminophen 500 mg q.6 as needed. 3. She will resume aspirin 325 mg daily. 4. Atenolol 100 mg daily. 5. Pletal 100 mg b.i.d. 6. Plavix 75 mg once daily. 7. Diltiazem as Tiazac 240 mg capsule once daily.  The patient is     advised to use Ensure. 8. Fluticasone 50 mcg per activation nasal spray 1 spray each nostril     daily. 9. Furosemide 40 mg daily. 10.Avelox 400 mg daily.  She is to complete her home supply. 11.Ranitidine 150 mg twice daily. 12.Triamcinolone 0.1% cream as needed. 13.Laxative of choice.  DISPOSITION:  The patient will be returning home to independent living. Her daughter does check on her and her  grandson does live with her in the evenings.  The patient expresses no wish for Home Health at this time, which I believe is appropriate.  She is able to contact the office if she has problems.  FOLLOWUP:  The patient will be seen in the office on Monday July 10, 2013, for routine followup.  The patient's condition  at time of discharge dictation is medically stable, but guarded given her advanced age.     Rosalyn Gess Monnie Anspach, MD     MEN/MEDQ  D:  07/05/2013  T:  07/05/2013  Job:  [id number]

## 2013-07-05 NOTE — Telephone Encounter (Signed)
Please forward to susan large to insure correct billing. thanks

## 2013-07-05 NOTE — Progress Notes (Signed)
Patient is doing well. She is maintaining O2 sat of 93+% on room air. She does not feel SOB. She wants to go home, does not feel she needs HH or assistance.  Plan for d/c home today. Office follow up Monday, July 21.  Dictated (priority) # [phone]

## 2013-07-05 NOTE — Telephone Encounter (Signed)
Per Dr Debby Bud to forward to you, thanks

## 2013-07-05 NOTE — Telephone Encounter (Signed)
Phone call from Lynden Ang [phone]) case manager at [facility] wanted you to be aware that patient has been discharged. She only met observation status. FYI for you to bill accordingly. Phone number attached if any questions.

## 2013-07-06 ENCOUNTER — Other Ambulatory Visit: Payer: Self-pay | Admitting: Internal Medicine

## 2013-07-09 LAB — CULTURE, BLOOD (ROUTINE X 2): Culture: NO GROWTH

## 2013-07-10 ENCOUNTER — Ambulatory Visit: Payer: Medicare PPO | Admitting: Internal Medicine

## 2013-07-18 ENCOUNTER — Encounter: Payer: Self-pay | Admitting: Internal Medicine

## 2013-07-18 ENCOUNTER — Ambulatory Visit (INDEPENDENT_AMBULATORY_CARE_PROVIDER_SITE_OTHER): Payer: Medicare PPO | Admitting: Internal Medicine

## 2013-07-18 VITALS — BP 140/50 | HR 44 | Temp 98.7°F | Wt 122.0 lb

## 2013-07-18 DIAGNOSIS — J189 Pneumonia, unspecified organism: Secondary | ICD-10-CM

## 2013-07-18 MED ORDER — ATENOLOL 100 MG PO TABS: 50.0000 mg | ORAL_TABLET | Freq: Every day | ORAL | Status: DC

## 2013-07-18 NOTE — Patient Instructions (Addendum)
Pneumonia - you seem to have beaten off the pneumonia and your breathing is good. I am glad that you feel better.  Heart - a slow heart rate today and good blood pressure Plan Please continue your present medications BUT take 1/2 tablet of atenolol (tenormin) once a day.  Come back to see me as needed.

## 2013-07-18 NOTE — Progress Notes (Signed)
Subjective:    Patient ID: [name], female    DOB: 22-Aug-1915, 79 y.o.   MRN: [id number]  HPI [name] was hospitalized July 14-16 for CAP that failed home treatment. She did well with continuation of quinolone therapy with an improvement in oxygenation. She was to complete her home supply of Avelox after dischart.   She has been doing well with no respiratory distress. She is more active. She feels better  She denies any lightheadness, SOB, increased weakness  PMH, FamHx and SocHx reviewed for any changes and relevance. Current Outpatient Prescriptions on File Prior to Visit  Medication Sig Dispense Refill  . acetaminophen (TYLENOL) 500 MG tablet Take 500 mg by mouth every 6 (six) hours as needed for pain.      Marland Kitchen aspirin 325 MG tablet Take 325 mg by mouth daily.        Marland Kitchen atenolol (TENORMIN) 100 MG tablet Take 100 mg by mouth daily.      . cilostazol (PLETAL) 100 MG tablet Take 100 mg by mouth 2 (two) times daily.      . clopidogrel (PLAVIX) 75 MG tablet Take 75 mg by mouth daily.      . clopidogrel (PLAVIX) 75 MG tablet TAKE 1 TABLET BY MOUTH EVERY DAY  30 tablet  5  . diltiazem (TIAZAC) 240 MG 24 hr capsule Take 240 mg by mouth daily.      . fluticasone (FLONASE) 50 MCG/ACT nasal spray Place 2 sprays into the nose daily as needed for rhinitis or allergies.      . furosemide (LASIX) 40 MG tablet Take 40 mg by mouth.      . moxifloxacin (AVELOX) 400 MG tablet Take 1 tablet (400 mg total) by mouth daily.  7 tablet  0  . ranitidine (ZANTAC) 150 MG tablet Take 150 mg by mouth 2 (two) times daily as needed for heartburn.      . triamcinolone cream (KENALOG) 0.1 % Apply 1 application topically 2 (two) times daily as needed (skin irritation).      . [DISCONTINUED] zolpidem (AMBIEN) 10 MG tablet Take 10 mg by mouth at bedtime as needed.         No current facility-administered medications on file prior to visit.      Review of Systems  Constitutional: Positive for unexpected  weight change. Negative for activity change and appetite change.  HENT: Negative.   Eyes: Negative.   Respiratory: Negative for apnea, choking, shortness of breath and wheezing.   Cardiovascular: Negative.   Gastrointestinal: Negative.   Endocrine: Negative.   Genitourinary: Negative.   Allergic/Immunologic: Negative.   Neurological: Negative.   Hematological: Negative.   Psychiatric/Behavioral: Negative.        Objective:   Physical Exam Filed Vitals:   07/18/13 1120  BP: 140/50  Pulse: 44  Temp: 98.7 F (37.1 C)   Wt Readings from Last 3 Encounters:  07/18/13 122 lb (55.339 kg)  07/05/13 119 lb 0.8 oz (54 kg)  06/29/13 129 lb (58.514 kg)   BP Readings from Last 3 Encounters:  07/18/13 140/50  07/05/13 148/65  06/29/13 170/60   Gen'l - very elderly woman in no distress Cor- Bradycardic at 44, II/VI systolic mm. Pulm - Good breath sounds, no rales or wheeze Neuro - Awake, alert, oriented. Visual impairment right eye, a little hard of hearing.        Assessment & Plan:  1. CAP - resolved after course of quinolones. No respiratory distress or SOB. Good  O2 sat on room air.

## 2013-07-19 ENCOUNTER — Ambulatory Visit: Payer: Medicare PPO | Admitting: Internal Medicine

## 2013-09-05 ENCOUNTER — Other Ambulatory Visit: Payer: Self-pay | Admitting: Internal Medicine

## 2013-10-17 ENCOUNTER — Other Ambulatory Visit: Payer: Self-pay | Admitting: Internal Medicine

## 2013-10-22 ENCOUNTER — Other Ambulatory Visit: Payer: Self-pay | Admitting: Internal Medicine

## 2013-10-23 ENCOUNTER — Ambulatory Visit: Payer: Self-pay | Admitting: Podiatry

## 2013-12-01 ENCOUNTER — Observation Stay (HOSPITAL_COMMUNITY): Payer: Medicare PPO

## 2013-12-01 ENCOUNTER — Ambulatory Visit: Payer: Medicare PPO | Admitting: Nurse Practitioner

## 2013-12-01 ENCOUNTER — Encounter (HOSPITAL_COMMUNITY): Payer: Self-pay

## 2013-12-01 ENCOUNTER — Telehealth: Payer: Self-pay | Admitting: Internal Medicine

## 2013-12-01 ENCOUNTER — Inpatient Hospital Stay (HOSPITAL_COMMUNITY)
Admission: AD | Admit: 2013-12-01 | Discharge: 2013-12-08 | DRG: 189 | Disposition: A | Discharge status: Skilled Nursing Facility | Payer: Medicare PPO | Source: Ambulatory Visit | Attending: Internal Medicine | Admitting: Internal Medicine

## 2013-12-01 DIAGNOSIS — J449 Chronic obstructive pulmonary disease, unspecified: Secondary | ICD-10-CM | POA: Diagnosis present

## 2013-12-01 DIAGNOSIS — I498 Other specified cardiac arrhythmias: Secondary | ICD-10-CM | POA: Diagnosis present

## 2013-12-01 DIAGNOSIS — W010XXA Fall on same level from slipping, tripping and stumbling without subsequent striking against object, initial encounter: Secondary | ICD-10-CM | POA: Diagnosis present

## 2013-12-01 DIAGNOSIS — J069 Acute upper respiratory infection, unspecified: Secondary | ICD-10-CM | POA: Diagnosis present

## 2013-12-01 DIAGNOSIS — J9600 Acute respiratory failure, unspecified whether with hypoxia or hypercapnia: Secondary | ICD-10-CM | POA: Diagnosis not present

## 2013-12-01 DIAGNOSIS — N390 Urinary tract infection, site not specified: Secondary | ICD-10-CM | POA: Diagnosis present

## 2013-12-01 DIAGNOSIS — J810 Acute pulmonary edema: Principal | ICD-10-CM | POA: Diagnosis present

## 2013-12-01 DIAGNOSIS — I255 Ischemic cardiomyopathy: Secondary | ICD-10-CM

## 2013-12-01 DIAGNOSIS — R350 Frequency of micturition: Secondary | ICD-10-CM | POA: Diagnosis present

## 2013-12-01 DIAGNOSIS — A498 Other bacterial infections of unspecified site: Secondary | ICD-10-CM | POA: Diagnosis present

## 2013-12-01 DIAGNOSIS — I10 Essential (primary) hypertension: Secondary | ICD-10-CM | POA: Diagnosis present

## 2013-12-01 DIAGNOSIS — R5381 Other malaise: Secondary | ICD-10-CM | POA: Diagnosis present

## 2013-12-01 DIAGNOSIS — E871 Hypo-osmolality and hyponatremia: Secondary | ICD-10-CM | POA: Diagnosis present

## 2013-12-01 DIAGNOSIS — F411 Generalized anxiety disorder: Secondary | ICD-10-CM

## 2013-12-01 DIAGNOSIS — Z66 Do not resuscitate: Secondary | ICD-10-CM | POA: Diagnosis present

## 2013-12-01 DIAGNOSIS — R531 Weakness: Secondary | ICD-10-CM

## 2013-12-01 DIAGNOSIS — J189 Pneumonia, unspecified organism: Secondary | ICD-10-CM | POA: Diagnosis present

## 2013-12-01 DIAGNOSIS — I5022 Chronic systolic (congestive) heart failure: Secondary | ICD-10-CM | POA: Diagnosis present

## 2013-12-01 DIAGNOSIS — Y92009 Unspecified place in unspecified non-institutional (private) residence as the place of occurrence of the external cause: Secondary | ICD-10-CM

## 2013-12-01 DIAGNOSIS — R300 Dysuria: Secondary | ICD-10-CM

## 2013-12-01 DIAGNOSIS — M79609 Pain in unspecified limb: Secondary | ICD-10-CM

## 2013-12-01 DIAGNOSIS — I2510 Atherosclerotic heart disease of native coronary artery without angina pectoris: Secondary | ICD-10-CM

## 2013-12-01 DIAGNOSIS — I4891 Unspecified atrial fibrillation: Secondary | ICD-10-CM | POA: Diagnosis present

## 2013-12-01 DIAGNOSIS — Z7982 Long term (current) use of aspirin: Secondary | ICD-10-CM

## 2013-12-01 DIAGNOSIS — I739 Peripheral vascular disease, unspecified: Secondary | ICD-10-CM | POA: Diagnosis present

## 2013-12-01 DIAGNOSIS — K219 Gastro-esophageal reflux disease without esophagitis: Secondary | ICD-10-CM

## 2013-12-01 DIAGNOSIS — I509 Heart failure, unspecified: Secondary | ICD-10-CM | POA: Diagnosis present

## 2013-12-01 DIAGNOSIS — E785 Hyperlipidemia, unspecified: Secondary | ICD-10-CM

## 2013-12-01 DIAGNOSIS — R0902 Hypoxemia: Secondary | ICD-10-CM | POA: Diagnosis present

## 2013-12-01 DIAGNOSIS — J4489 Other specified chronic obstructive pulmonary disease: Secondary | ICD-10-CM | POA: Diagnosis present

## 2013-12-01 DIAGNOSIS — I5033 Acute on chronic diastolic (congestive) heart failure: Secondary | ICD-10-CM

## 2013-12-01 LAB — COMPREHENSIVE METABOLIC PANEL
ALT: 79 U/L — ABNORMAL HIGH (ref 0–35)
AST: 47 U/L — ABNORMAL HIGH (ref 0–37)
Albumin: 3.5 g/dL (ref 3.5–5.2)
CO2: 29 mEq/L (ref 19–32)
Calcium: 9.5 mg/dL (ref 8.4–10.5)
Chloride: 96 mEq/L (ref 96–112)
GFR calc non Af Amer: 55 mL/min — ABNORMAL LOW (ref >90)
Potassium: 3.7 mEq/L (ref 3.5–5.1)
Sodium: 134 mEq/L — ABNORMAL LOW (ref 135–145)
Total Bilirubin: 0.7 mg/dL (ref 0.3–1.2)
Total Protein: 7 g/dL (ref 6.0–8.3)

## 2013-12-01 LAB — URINE MICROSCOPIC-ADD ON

## 2013-12-01 LAB — CBC WITH DIFFERENTIAL/PLATELET
Basophils Absolute: 0 10*3/uL (ref 0.0–0.1)
Basophils Relative: 0 % (ref 0–1)
Eosinophils Absolute: 0 10*3/uL (ref 0.0–0.7)
Eosinophils Relative: 0 % (ref 0–5)
Hemoglobin: 14.5 g/dL (ref 12.0–15.0)
MCH: 28.7 pg (ref 26.0–34.0)
MCHC: 33.1 g/dL (ref 30.0–36.0)
MCV: 86.7 fL (ref 78.0–100.0)
Monocytes Relative: 6 % (ref 3–12)
Neutrophils Relative %: 77 % (ref 43–77)
Platelets: 235 10*3/uL (ref 150–400)

## 2013-12-01 LAB — MRSA PCR SCREENING: MRSA by PCR: NEGATIVE

## 2013-12-01 LAB — URINALYSIS, ROUTINE W REFLEX MICROSCOPIC
Bilirubin Urine: NEGATIVE
Glucose, UA: NEGATIVE mg/dL
Hgb urine dipstick: NEGATIVE
Ketones, ur: NEGATIVE mg/dL
Specific Gravity, Urine: 1.014 (ref 1.005–1.030)
Urobilinogen, UA: 0.2 mg/dL (ref 0.0–1.0)

## 2013-12-01 MED ORDER — CILOSTAZOL 100 MG PO TABS
100.0000 mg | ORAL_TABLET | Freq: Two times a day (BID) | ORAL | Status: DC
  Administered 2013-12-01 – 2013-12-08 (×14): 100 mg via ORAL
  Filled 2013-12-01 (×16): qty 1

## 2013-12-01 MED ORDER — SODIUM CHLORIDE 0.9 % IJ SOLN: 3.0000 mL | INTRAMUSCULAR | Status: DC | PRN

## 2013-12-01 MED ORDER — ATENOLOL 50 MG PO TABS: 50.0000 mg | ORAL_TABLET | Freq: Every day | ORAL | Status: DC

## 2013-12-01 MED ORDER — MORPHINE SULFATE 2 MG/ML IJ SOLN
2.0000 mg | Freq: Once | INTRAMUSCULAR | Status: AC
  Administered 2013-12-01: 2 mg via INTRAVENOUS
  Filled 2013-12-01: qty 1

## 2013-12-01 MED ORDER — ENOXAPARIN SODIUM 30 MG/0.3ML ~~LOC~~ SOLN
30.0000 mg | SUBCUTANEOUS | Status: DC
  Administered 2013-12-01 – 2013-12-07 (×7): 30 mg via SUBCUTANEOUS
  Filled 2013-12-01 (×9): qty 0.3

## 2013-12-01 MED ORDER — CLOPIDOGREL BISULFATE 75 MG PO TABS
75.0000 mg | ORAL_TABLET | Freq: Once | ORAL | Status: DC
  Filled 2013-12-01: qty 1

## 2013-12-01 MED ORDER — FUROSEMIDE 10 MG/ML IJ SOLN
40.0000 mg | Freq: Four times a day (QID) | INTRAMUSCULAR | Status: AC
  Administered 2013-12-01 – 2013-12-02 (×2): 40 mg via INTRAVENOUS
  Filled 2013-12-01 (×4): qty 4

## 2013-12-01 MED ORDER — LABETALOL HCL 5 MG/ML IV SOLN
20.0000 mg | Freq: Once | INTRAVENOUS | Status: AC
  Administered 2013-12-01: 20 mg via INTRAVENOUS

## 2013-12-01 MED ORDER — SODIUM CHLORIDE 0.45 % IV SOLN
INTRAVENOUS | Status: DC
  Administered 2013-12-01: 18:00:00 via INTRAVENOUS

## 2013-12-01 MED ORDER — LEVALBUTEROL HCL 0.63 MG/3ML IN NEBU: 0.6300 mg | INHALATION_SOLUTION | Freq: Four times a day (QID) | RESPIRATORY_TRACT | Status: DC | PRN

## 2013-12-01 MED ORDER — ATENOLOL 100 MG PO TABS
100.0000 mg | ORAL_TABLET | Freq: Every day | ORAL | Status: DC
  Administered 2013-12-02 – 2013-12-08 (×7): 100 mg via ORAL
  Filled 2013-12-01 (×9): qty 1

## 2013-12-01 MED ORDER — DILTIAZEM HCL ER COATED BEADS 240 MG PO CP24
240.0000 mg | ORAL_CAPSULE | Freq: Every day | ORAL | Status: DC
  Administered 2013-12-02 – 2013-12-08 (×7): 240 mg via ORAL
  Filled 2013-12-01 (×8): qty 1

## 2013-12-01 MED ORDER — ASPIRIN 325 MG PO TABS
325.0000 mg | ORAL_TABLET | Freq: Every day | ORAL | Status: DC
  Administered 2013-12-02 – 2013-12-08 (×7): 325 mg via ORAL
  Filled 2013-12-01 (×8): qty 1

## 2013-12-01 MED ORDER — LORAZEPAM 0.5 MG PO TABS
0.5000 mg | ORAL_TABLET | Freq: Once | ORAL | Status: AC
  Administered 2013-12-01: 0.5 mg via ORAL
  Filled 2013-12-01: qty 1

## 2013-12-01 MED ORDER — FUROSEMIDE 40 MG PO TABS
40.0000 mg | ORAL_TABLET | Freq: Every day | ORAL | Status: DC
  Administered 2013-12-02 – 2013-12-08 (×7): 40 mg via ORAL
  Filled 2013-12-01 (×8): qty 1

## 2013-12-01 MED ORDER — SODIUM CHLORIDE 0.45 % IV SOLN: 50.0000 mL/h | INTRAVENOUS | Status: DC

## 2013-12-01 MED ORDER — FAMOTIDINE 10 MG PO TABS
10.0000 mg | ORAL_TABLET | Freq: Two times a day (BID) | ORAL | Status: DC
  Administered 2013-12-01: 10 mg via ORAL
  Filled 2013-12-01: qty 1

## 2013-12-01 MED ORDER — SODIUM CHLORIDE 0.9 % IV SOLN: 250.0000 mL | INTRAVENOUS | Status: DC | PRN

## 2013-12-01 MED ORDER — ACETAMINOPHEN 500 MG PO TABS
500.0000 mg | ORAL_TABLET | Freq: Four times a day (QID) | ORAL | Status: DC | PRN
  Administered 2013-12-01: 500 mg via ORAL
  Filled 2013-12-01: qty 1

## 2013-12-01 MED ORDER — SODIUM CHLORIDE 0.9 % IJ SOLN: 3.0000 mL | Freq: Two times a day (BID) | INTRAMUSCULAR | Status: DC

## 2013-12-01 NOTE — H&P (Signed)
[name] is an 79 y.o. female.   Chief Complaint: Feeling very sick for a weel HPI: [name] is a frail 79 y/o woman with h/o CAD, PVD, HTN, COPD, CHF who called the office today c/o being sick for a week but to weak to come to the office. She is very reliable, if not a bit stoic, about her health. She reports for a week she has been ill but in a non-specific way. She has had a little increased urinary frequency, dysuria, poor PO intake; she denies frank shortness of breath but has been less active. She reports that several days ago she fell on her way to the bathroom but denies any injury. Her bowels have been regular, she denies any cough, documented fever but she has felt feverish. She is now admitted on observation with concern for decompensation CHF and possibly a UTI. She was recently treated for pneumonia and this may also be a possibility.   Past Medical History  Diagnosis Date  . CAD (coronary artery disease)   . Hyperlipidemia   . Osteoporosis   . PVD (peripheral vascular disease)   . HTN (hypertension)   . Diverticulosis of colon   . GERD (gastroesophageal reflux disease)   . Hip fracture, right   . Decubitus ulcer     Right heel  . COPD (chronic obstructive pulmonary disease)   . Anxiety   . Anemia   . Esophageal stricture   . CHF (congestive heart failure)   . Atrial fibrillation   . Fibromuscular dysplasia     Renal artery  . Myocardial infarction     Past Surgical History  Procedure Laterality Date  . Ptca  04/2003  . Cataract extraction      Bilateral  . Orif acetabular fracture  2009    No family history on file.  Social History:  reports that she has never smoked. She has never used smokeless tobacco. She reports that she does not drink alcohol or use illicit drugs. Married '36 - '43 Divorced: Married '46 - '82 widowed . Lives alone, grandson lives w/her. I-ADLs, daughter is close at hand. END of Life Care: DNR, DNI, No heroic or futile    Allergies:   Allergies  Allergen Reactions  . Codeine Phosphate     REACTION: unspecified  . Doxycycline Other (See Comments)    unknown  . Erythromycin Other (See Comments)    unknown    Medications Prior to Admission  Medication Sig Dispense Refill  . acetaminophen (TYLENOL) 500 MG tablet Take 500 mg by mouth every 6 (six) hours as needed for pain.      Marland Kitchen aspirin 325 MG tablet Take 325 mg by mouth daily.        Marland Kitchen atenolol (TENORMIN) 100 MG tablet Take 0.5 tablets (50 mg total) by mouth daily.      Marland Kitchen atenolol (TENORMIN) 100 MG tablet TAKE 1 TABLET BY MOUTH DAILY  30 tablet  2  . cilostazol (PLETAL) 100 MG tablet Take 100 mg by mouth 2 (two) times daily.      . clopidogrel (PLAVIX) 75 MG tablet TAKE 1 TABLET BY MOUTH EVERY DAY  30 tablet  5  . diltiazem (CARDIZEM CD) 240 MG 24 hr capsule TAKE ONE CAPSULE BY MOUTH EVERY DAY  30 capsule  5  . fluticasone (FLONASE) 50 MCG/ACT nasal spray Place 2 sprays into the nose daily as needed for rhinitis or allergies.      . furosemide (LASIX) 40 MG  tablet TAKE 1 TABLET BY MOUTH EVERY DAY  30 tablet  5  . ranitidine (ZANTAC) 150 MG tablet TAKE 1 TABLET BY MOUTH TWICE A DAY  60 tablet  3  . triamcinolone cream (KENALOG) 0.1 % Apply 1 application topically 2 (two) times daily as needed (skin irritation).        Review of Systems  Constitutional: Positive for chills and malaise/fatigue. Negative for fever and weight loss.  HENT: Positive for hearing loss. Negative for congestion and sore throat.   Eyes: Negative.   Respiratory: Positive for shortness of breath. Negative for cough.   Cardiovascular: Negative for chest pain, palpitations, orthopnea and leg swelling.  Gastrointestinal: Negative for heartburn, nausea and diarrhea.  Genitourinary: Positive for dysuria and frequency. Negative for hematuria and flank pain.  Musculoskeletal: Positive for falls and myalgias.  Skin: Negative.   Neurological: Positive for dizziness, speech change and weakness. Negative  for sensory change, focal weakness and seizures.  Endo/Heme/Allergies: Negative.   Psychiatric/Behavioral: Negative for depression. The patient is not nervous/anxious.     Blood pressure 163/61, pulse 68, resp. rate 20, height 5\' 3"  (1.6 m), SpO2 97.00%. Physical Exam  Gen'l- elderly and frail appearing woman in no acute distress HEENT - temporal wasting noted, C&S clear, dry mm noted Neck - no thyromegaly, JVD 5 cm at 30 degrees Nodes - negative at cervical region Pulm - abdominal accessories in use, mild neck retractions. Rales at the left base with clear breath sounds other fields Cor- 2+ radial pulse, quiet precordium, RRR, no peripheral edema Abd- BS+ x 4, No HSM, no guarding or rebound tenderness Genitalia - deferred Ext - interosseous wasting hands, no deformity. Neuro - A&O x 3, CN- normal facial symmetry and movement, MAE, no tremor Derm - poor skin turgor (age related), no lesions  12 Lead EKG LBBB  Assessment/Plan 1. Cardiac - patient with CAD but no chest pain or signs of ACS. Known h/o CHF - last 2 D echo 03/10/12 - nl left LV, EF 55-60%. Exam does suggest mild failure with rales left base, mild increased WOB.  Plan Lab: pBNP, PA-Lat CXR  2 D echo - r/o diastolic dysfunction  Continue home dose of lasix - will adjust medications based on test results  2. ID -  Possible infection as cause of her illness. As a 79 y/o absence of fever is not conclusive. GU track likely suspect. No cough/sputum production makes pneumonia less likely  Plan Lab: U/A, CBCD, 2 view CXR  3. Code status - per previous discussions: DNR/DNI and no heroic measures.   [name] 12/01/2013, 4:38 PM  After hours call: [phone]

## 2013-12-01 NOTE — Telephone Encounter (Signed)
Pt is scheduled with [name] today.  She wants to be admitted to [facility] instead.  She can barely walk, she has a bad headache and says she is "sick all over".   She wants [place] to get a room for her so she won't have to wait in the ER.

## 2013-12-01 NOTE — Telephone Encounter (Signed)
Patient called - will arrange direct admit to [facility]. She will wait to hear from the hospital and then call for non-emergent transport.

## 2013-12-01 NOTE — Progress Notes (Signed)
Subjective/Objective Patient restless. Given Ativan 0.5 mg with no improvement. Moaning and yelling in pain - stating she could not void but refused I&O cath. Became more agitated, oxygen saturation in 60s-70s. Improved after Morphine 2 mg IV  Scheduled Meds: . aspirin  325 mg Oral Daily  . atenolol  100 mg Oral Daily  . cilostazol  100 mg Oral BID  . clopidogrel  75 mg Oral Once  . diltiazem  240 mg Oral Daily  . enoxaparin (LOVENOX) injection  30 mg Subcutaneous Q24H  . famotidine  10 mg Oral BID  . furosemide  40 mg Intravenous Q6H  . furosemide  40 mg Oral Daily  .  morphine injection  2 mg Intravenous Once   Continuous Infusions: . sodium chloride 20 mL/hr at 12/01/13 1743   PRN Meds:acetaminophen, levalbuterol  Vital signs in last 24 hours: Temp:  [97.2 F (36.2 C)] 97.2 F (36.2 C) (12/12 2047) Pulse Rate:  [68-94] 94 (12/12 2047) Resp:  [18-20] 18 (12/12 2047) BP: (125-163)/(61-74) 125/74 mmHg (12/12 2047) SpO2:  [97 %-100 %] 100 % (12/12 2047)  Intake/Output last 3 shifts:   Intake/Output this shift:    Physical Examination: General appearance - moaning, restless. Opens eyes to verbal stimuli but does not answer questions. Chest - clear to auscultation, no wheezes, rales or rhonchi, symmetric air entry, wheezing noted scattered bilaterally Heart - Tachycardic.   Problem Assessment/Plan:  Acute respiratory failure : Placed on 100% NRB with improvement in sats to low 90s. Will move to ICU. Will attempt to contact Dr. Arthur Holms.   Hypertension: received Lasix IV - Treat for hypertensive crisis with Labetolol.

## 2013-12-01 NOTE — Telephone Encounter (Signed)
I am not able with this limited information.  She should proceed to ER, or consider sat clinic in AM

## 2013-12-02 DIAGNOSIS — I10 Essential (primary) hypertension: Secondary | ICD-10-CM

## 2013-12-02 DIAGNOSIS — I359 Nonrheumatic aortic valve disorder, unspecified: Secondary | ICD-10-CM

## 2013-12-02 DIAGNOSIS — I4891 Unspecified atrial fibrillation: Secondary | ICD-10-CM

## 2013-12-02 DIAGNOSIS — I509 Heart failure, unspecified: Secondary | ICD-10-CM | POA: Diagnosis present

## 2013-12-02 DIAGNOSIS — I5033 Acute on chronic diastolic (congestive) heart failure: Secondary | ICD-10-CM

## 2013-12-02 DIAGNOSIS — F411 Generalized anxiety disorder: Secondary | ICD-10-CM

## 2013-12-02 LAB — CBC
HCT: 45.7 % (ref 36.0–46.0)
Hemoglobin: 15.3 g/dL — ABNORMAL HIGH (ref 12.0–15.0)
MCH: 28.9 pg (ref 26.0–34.0)
MCHC: 33.5 g/dL (ref 30.0–36.0)
MCV: 86.2 fL (ref 78.0–100.0)
RBC: 5.3 MIL/uL — ABNORMAL HIGH (ref 3.87–5.11)
RDW: 13.5 % (ref 11.5–15.5)

## 2013-12-02 LAB — BASIC METABOLIC PANEL
BUN: 25 mg/dL — ABNORMAL HIGH (ref 6–23)
CO2: 25 mEq/L (ref 19–32)
Calcium: 9.6 mg/dL (ref 8.4–10.5)
Creatinine, Ser: 0.95 mg/dL (ref 0.50–1.10)
GFR calc non Af Amer: 49 mL/min — ABNORMAL LOW (ref >90)
Glucose, Bld: 168 mg/dL — ABNORMAL HIGH (ref 70–99)
Potassium: 4 mEq/L (ref 3.5–5.1)
Sodium: 136 mEq/L (ref 135–145)

## 2013-12-02 LAB — TSH: TSH: 8.942 u[IU]/mL — ABNORMAL HIGH (ref 0.350–4.500)

## 2013-12-02 LAB — HEMOGLOBIN A1C
Hgb A1c MFr Bld: 5.7 % — ABNORMAL HIGH (ref <5.7)
Mean Plasma Glucose: 117 mg/dL — ABNORMAL HIGH (ref <117)

## 2013-12-02 MED ORDER — HYDRALAZINE HCL 20 MG/ML IJ SOLN: 5.0000 mg | INTRAMUSCULAR | Status: DC | PRN

## 2013-12-02 MED ORDER — MORPHINE SULFATE 2 MG/ML IJ SOLN
2.0000 mg | INTRAMUSCULAR | Status: DC | PRN
  Filled 2013-12-02: qty 1

## 2013-12-02 MED ORDER — PANTOPRAZOLE SODIUM 40 MG IV SOLR
40.0000 mg | INTRAVENOUS | Status: DC
  Administered 2013-12-02 – 2013-12-03 (×3): 40 mg via INTRAVENOUS
  Filled 2013-12-02 (×4): qty 40

## 2013-12-02 MED ORDER — DEXTROSE 5 % IV SOLN
1.0000 g | INTRAVENOUS | Status: DC
  Administered 2013-12-02 – 2013-12-04 (×3): 1 g via INTRAVENOUS
  Filled 2013-12-02 (×3): qty 10

## 2013-12-02 MED ORDER — LORAZEPAM 2 MG/ML IJ SOLN: 0.5000 mg | Freq: Four times a day (QID) | INTRAMUSCULAR | Status: DC | PRN

## 2013-12-02 MED ORDER — ALBUTEROL SULFATE (5 MG/ML) 0.5% IN NEBU
2.5000 mg | INHALATION_SOLUTION | Freq: Four times a day (QID) | RESPIRATORY_TRACT | Status: DC
  Administered 2013-12-02 (×4): 2.5 mg via RESPIRATORY_TRACT
  Filled 2013-12-02 (×4): qty 0.5

## 2013-12-02 MED ORDER — HYDRALAZINE HCL 20 MG/ML IJ SOLN: 10.0000 mg | INTRAMUSCULAR | Status: DC | PRN

## 2013-12-02 MED ORDER — FUROSEMIDE 10 MG/ML IJ SOLN
40.0000 mg | Freq: Once | INTRAMUSCULAR | Status: AC
  Administered 2013-12-02: 40 mg via INTRAVENOUS
  Filled 2013-12-02: qty 4

## 2013-12-02 MED ORDER — CEFTRIAXONE SODIUM 1 G IJ SOLR
1.0000 g | Freq: Every day | INTRAMUSCULAR | Status: DC
  Filled 2013-12-02 (×2): qty 10

## 2013-12-02 MED ORDER — METHYLPREDNISOLONE SODIUM SUCC 125 MG IJ SOLR
80.0000 mg | Freq: Two times a day (BID) | INTRAMUSCULAR | Status: DC
  Administered 2013-12-02 (×3): 80 mg via INTRAVENOUS
  Filled 2013-12-02 (×5): qty 1.28

## 2013-12-02 NOTE — Progress Notes (Signed)
Called to room 1320 at 2300 per floor RN. Per RN pt becoming increasing agitated. NP Daphane Shepherd paged and orders received for ativan ivp. 0.5 mg Ativan given at 2255 withh no imporvement of agitation. Upon my arrival at 2305 Pt found moaning in pain but unable to communicate where pain is.  Unable to follow simple commands. Placed on 02 sat monitor with 02 sats 45% on 3 LNC. Pt placed on NRB and hob repositioned. Lungs congested with wheezing on expiration. [facility] NP Daphane Shepherd and Dr Conley Rolls at bedside. Apparently some confusion was had with floor RN and [facility] about which MD service is managing care. Dr Debby Bud paged. Pt given labetolol IVP and Morphine IVP for pain. Family called fer floor RN, Grandson en route to hospital. Pt prepared for transfer and brought to ICU room 1233 at 0015. EKG obtained yielding afib. Call received from Dr Debby Bud, updated on pt status. MD at bedside at 0030 for pt assessment and orders. Will monitor pt closely in ICU.

## 2013-12-02 NOTE — Progress Notes (Signed)
Pt complain of not able to urinate after lasix was given, paged the oncall an order was given to bladder scan and in and out cath pt. Pt refused to be in and out cath at that point very restless and anxious. Pt eventually urinated on own U/A was sent down. Pt became more agitated had ativan but was still restless, RN paged for the rapid response nurse and on call. Two MD came up to see pt at that point. Further orders were given. Paged the oncall for Dr Arthur Holms x 2. Pt was then  Transferred to stepdown.

## 2013-12-02 NOTE — Progress Notes (Signed)
*  Echocardiogram 2D Echocardiogram has been performed.  [name] M 12/02/2013, 10:29 AM

## 2013-12-02 NOTE — Progress Notes (Signed)
Subjective: Change in patient condition - per RN report [name] became restless, c/o pain and discomfort, had difficulty voiding. She had been given IV lasix x 1. For her agitation she was given ativan. Because of persistent discomfort she was given morphine. Her BP went up , her O2 sats went down and she was transferred to step-down.  On exam she is calmed, on rebreather mask, and she is not agitated.  Objective: Lab:  Recent Labs  12/01/13 1759  WBC 9.3  NEUTROABS 7.2  HGB 14.5  HCT 43.8  MCV 86.7  PLT 235    Recent Labs  12/01/13 1759  NA 134*  K 3.7  CL 96  GLUCOSE 174*  BUN 24*  CREATININE 0.86  CALCIUM 9.5  pBNP = 4315.   Imaging: 12/12 1716 CXR: IMPRESSION:  Enlargement of cardiac silhouette with pulmonary vascular  congestion.  Mild CHF with small dependent pleural effusions bilaterally.  Scheduled Meds: . aspirin  325 mg Oral Daily  . atenolol  100 mg Oral Daily  . cilostazol  100 mg Oral BID  . clopidogrel  75 mg Oral Once  . diltiazem  240 mg Oral Daily  . enoxaparin (LOVENOX) injection  30 mg Subcutaneous Q24H  . famotidine  10 mg Oral BID  . furosemide  40 mg Intravenous Q6H  . furosemide  40 mg Oral Daily  .  morphine injection  2 mg Intravenous Once   Continuous Infusions: . sodium chloride 20 mL/hr at 12/01/13 1743   PRN Meds:.acetaminophen, levalbuterol   Physical Exam: Filed Vitals:   12/02/13 0020  BP: 154/104  Pulse: 38  Temp:   Resp: 27   Gen'l - patient is in decubitus position. She is arousable but not restless. Cor - 2+ radial pulse, heart sounds are distant and irregular PUlm - on Rebreather mask, tight breath sounds with diffuse wheezing, feint rales, prolonged expiratory phase. Abd - BS+, soft, no guarding, mildly tender to exam Neuro - she is sedated but can be awakened. Speech is clear. MAE      Assessment/Plan: 1. Cardiac - patient at time of admission had mild CHF by x-ray and by elevated BNP. Her initial  exam revealed mild work of breathing. At this time her exam is consistent with worsening CHF with decreased breathsound, prolonged expiratory phase, wheezing. She is not a candidate for cardiac interventions or intubation. She is not in need of pressors. She has gone into A. Fib but her rate is controlled.  Plan IV lasix dose now and then q 6 as ordered.  Solumedrol 80 mg IV now  Nebulizer with albuterol  MS 2 mg q 2 hr prn agitation, respiratory distress  2. ID - WBC normal, U/A pending   Plan Will cover with Rocephin 1 g IV  3. No change in code status.    [name] Campbell IM (o) [phone]; (c) [phone] Call-grp - Patsi Sears IM  Tele: [phone]  12/02/2013, 12:32 AM

## 2013-12-02 NOTE — Progress Notes (Signed)
Spoke to Dr Arthur Holms to give report on pt status.

## 2013-12-02 NOTE — Progress Notes (Signed)
Subjective: [name] is much more awake this AM and recognizes examiner and denies any pain. She does report she has had "strong" urine for several days. She does not feel short of breath.  Objective: Lab:  Recent Labs  12/01/13 1759 12/02/13 0340  WBC 9.3 15.0*  NEUTROABS 7.2  --   HGB 14.5 15.3*  HCT 43.8 45.7  MCV 86.7 86.2  PLT 235 206    Recent Labs  12/01/13 1759 12/02/13 0340  NA 134* 136  K 3.7 4.0  CL 96 98  GLUCOSE 174* 168*  BUN 24* 25*  CREATININE 0.86 0.95  CALCIUM 9.5 9.6   U/A 1.014, 11-20 WBC, many bacteria  A1C 5.7%  Imaging:  AM CXR pending  Scheduled Meds: . albuterol  2.5 mg Nebulization Q6H  . aspirin  325 mg Oral Daily  . atenolol  100 mg Oral Daily  . cilostazol  100 mg Oral BID  . clopidogrel  75 mg Oral Once  . diltiazem  240 mg Oral Daily  . enoxaparin (LOVENOX) injection  30 mg Subcutaneous Q24H  . furosemide  40 mg Intravenous Q6H  . furosemide  40 mg Oral Daily  . methylPREDNISolone (SOLU-MEDROL) injection  80 mg Intravenous BID  . pantoprazole (PROTONIX) IV  40 mg Intravenous Q24H   Continuous Infusions: . sodium chloride 20 mL/hr at 12/01/13 1743   PRN Meds:.acetaminophen, hydrALAZINE, levalbuterol, LORazepam, morphine injection   Physical Exam: Filed Vitals:   12/02/13 0600  BP: 146/81  Pulse: 131  Temp:   Resp: 28    Intake/Output Summary (Last 24 hours) at 12/02/13 0651 Last data filed at 12/02/13 0600  Gross per 24 hour  Intake      0 ml  Output    625 ml  Net   -625 ml   Gen'l - elderly woman in no distress this AM Cor - Regular tachycardia Pulm - decreased BS, rales basilar, minimal wheezing Abd- soft - BS +, no guarding or rebound Neuro - much more awake this AM, cogent.     Assessment/Plan: 1. Cardiac - patient with flash pulmonary edema last night - fair diuresis since 0100. Exam reveals decreased rales , decreased wheezing. She is in sinus tachy.  Plan Continue diuresis  May have her  regular meds including diltiazem - should help with rate  Will continue solumedrol today.  Titrate done FiO2 as tolerated.  2. ID - U/A positive. WBC rapidly rose overnight.  Plan Rocephin q 24 pending urine culture - will adjust antibiotics based on SS.  Overall  [name] has significantly improved since 0100 hr. Prognosis remains guarded 2/2 co-morbidity and age.   [name] IM (o) [phone]; (c) [phone] Call-grp - [name] IM  Tele: [phone]  12/02/2013, 6:50 AM

## 2013-12-02 NOTE — Care Management Note (Signed)
UR complete    Karley Pho,MSN,RN [phone] 

## 2013-12-03 ENCOUNTER — Inpatient Hospital Stay (HOSPITAL_COMMUNITY): Payer: Medicare PPO

## 2013-12-03 DIAGNOSIS — I2589 Other forms of chronic ischemic heart disease: Secondary | ICD-10-CM

## 2013-12-03 DIAGNOSIS — I2510 Atherosclerotic heart disease of native coronary artery without angina pectoris: Secondary | ICD-10-CM

## 2013-12-03 MED ORDER — ENSURE COMPLETE PO LIQD
237.0000 mL | Freq: Two times a day (BID) | ORAL | Status: DC
  Administered 2013-12-03 – 2013-12-08 (×8): 237 mL via ORAL

## 2013-12-03 MED ORDER — ALBUTEROL SULFATE (5 MG/ML) 0.5% IN NEBU
2.5000 mg | INHALATION_SOLUTION | Freq: Three times a day (TID) | RESPIRATORY_TRACT | Status: DC
  Administered 2013-12-03 – 2013-12-06 (×11): 2.5 mg via RESPIRATORY_TRACT
  Filled 2013-12-03 (×10): qty 0.5

## 2013-12-03 MED ORDER — PREDNISONE 20 MG PO TABS
20.0000 mg | ORAL_TABLET | Freq: Two times a day (BID) | ORAL | Status: DC
  Administered 2013-12-03 – 2013-12-04 (×2): 20 mg via ORAL
  Filled 2013-12-03 (×4): qty 1

## 2013-12-03 NOTE — Progress Notes (Signed)
Subjective: [name] is awake and alert. She denies any chest pain, does not c/o shortness of breath. She is anxious to get out of the hospital  Objective: Lab:  Recent Labs  12/01/13 1759 12/02/13 0340  WBC 9.3 15.0*  NEUTROABS 7.2  --   HGB 14.5 15.3*  HCT 43.8 45.7  MCV 86.7 86.2  PLT 235 206    Recent Labs  12/01/13 1759 12/02/13 0340  NA 134* 136  K 3.7 4.0  CL 96 98  GLUCOSE 174* 168*  BUN 24* 25*  CREATININE 0.86 0.95  CALCIUM 9.5 9.6    Imaging: 12/02/13 2 D echo - global hypokinesis with EF 30% (55-60% last study)  12/03/13 PORTABLE CHEST - 1 VIEW  COMPARISON: For 12/01/2013  FINDINGS:  Cardiac shadow is stable. The left basilar infiltrate with  associated effusion is now noted. Vascular congestion with pulmonary  edema is also noted. No bony abnormality is seen.  IMPRESSION:  Congestive failure with new left basilar infiltrate   Scheduled Meds: . albuterol  2.5 mg Nebulization Q6H  . aspirin  325 mg Oral Daily  . atenolol  100 mg Oral Daily  . cefTRIAXone (ROCEPHIN)  IV  1 g Intravenous Q24H  . cilostazol  100 mg Oral BID  . clopidogrel  75 mg Oral Once  . diltiazem  240 mg Oral Daily  . enoxaparin (LOVENOX) injection  30 mg Subcutaneous Q24H  . furosemide  40 mg Oral Daily  . methylPREDNISolone (SOLU-MEDROL) injection  80 mg Intravenous BID  . pantoprazole (PROTONIX) IV  40 mg Intravenous Q24H   Continuous Infusions: . sodium chloride 20 mL/hr at 12/01/13 1743   PRN Meds:.acetaminophen, hydrALAZINE, levalbuterol, LORazepam, morphine injection   Physical Exam: Filed Vitals:   12/03/13 0800  BP:   Pulse:   Temp: 97.9 F (36.6 C)  Resp:     Intake/Output Summary (Last 24 hours) at 12/03/13 0830 Last data filed at 12/03/13 0600  Gross per 24 hour  Intake   1059 ml  Output   1500 ml  Net   -441 ml   Total this adm: -1,006  Wt Readings from Last 3 Encounters:  12/03/13 126 lb 15.8 oz (57.6 kg)  07/18/13 122 lb (55.339 kg)   07/05/13 119 lb 0.8 oz (54 kg)   Gen'l  Very elderly woman in good spirits and comfortable HEENT - C&S clear Cor - 2+ radial pulse, irregular rhythm Pulm - Good breath sounds w/o wheezing, no rales Abd - soft Neuro - awake, alert, speech voluble and clear, cognition normal       Assessment/Plan: 1. Cardiac - flash pulmonary edema is markedly improved with a 1 liter diuresis. Changed 2 D echo with global hypokinesis and dropped EF and with ST elevation on telemetry is suggestive of a STEMI. She is hemodynamically stable and pain free. AT 96 she is not a candidate for intervention thus will not cycle enzymes but will treat medically. She does appear stable but at risk. Without chest pain she does not require long-acting nitrates.  Plan 12 lead EKG  Transfer to a tele bed  Continue BB, will add low dose ARB if she maintains a stable blood pressure, continue furosemide  D/c solumedrol, give prednisone 20 mg bid.  2. ID - Day # 2 Rocephin for positive U/A and todays cxr with infiltrate. She is afebrile, lungs are clear and no fever.  Plan Continue rocephin pending cultures.  Overall [name] is much improved but her prognosis remains  guarded.   Called and gave an update to her grandson.     [name] [place] [facility] (o) [phone]; (c) [phone] Call-grp - Patsi Sears IM  Tele: [phone]  12/03/2013, 8:28 AM

## 2013-12-03 NOTE — Progress Notes (Signed)
INITIAL NUTRITION ASSESSMENT  DOCUMENTATION CODES Per approved criteria  -Not Applicable   INTERVENTION: Ensure Complete po BID, each supplement provides 350 kcal and 13 grams of protein  NUTRITION DIAGNOSIS: Inadequate oral intake related to weakness as evidenced by reported intake less than estimated needs.   Goal: Pt to meet >/= 90% of their estimated nutrition needs   Monitor:  Wt, po intake, acceptance of supplements, labs  Reason for Assessment: MST  79 y.o. female  Admitting Dx: <principal problem not specified>  ASSESSMENT: [name] is a frail 79 y/o woman with h/o CAD, PVD, HTN, COPD, CHF who called the office today c/o being sick for a week but to weak to come to the office.  Pt reports that she has been eating poorly recently. She has had no recent wt loss according to her. She says that her current body wt, 125 lbs, is normal for her. She says that she drinks about one ensure per day at home. She says that she would like to have ensure sent while in the hospital.   Height: Ht Readings from Last 1 Encounters:  12/02/13 5\' 3"  (1.6 m)    Weight: Wt Readings from Last 1 Encounters:  12/03/13 126 lb 15.8 oz (57.6 kg)    Ideal Body Weight: 52.4 kg  % Ideal Body Weight: 110%  Wt Readings from Last 10 Encounters:  12/03/13 126 lb 15.8 oz (57.6 kg)  07/18/13 122 lb (55.339 kg)  07/05/13 119 lb 0.8 oz (54 kg)  06/29/13 129 lb (58.514 kg)  05/18/13 129 lb 12.8 oz (58.877 kg)  07/18/12 138 lb (62.596 kg)  03/22/12 134 lb (60.782 kg)  03/11/12 134 lb 4.2 oz (60.9 kg)  02/17/12 141 lb 2 oz (64.014 kg)  10/22/11 145 lb (65.772 kg)    Usual Body Weight: 125 lbs  % Usual Body Weight: 100%  BMI:  Body mass index is 22.5 kg/(m^2).  Estimated Nutritional Needs: Kcal: 1450-1700 Protein: 70-80 g Fluid: >1.7 L  Skin: WNL  Diet Order: Cardiac   Intake/Output Summary (Last 24 hours) at 12/03/13 1108 Last data filed at 12/03/13 0600  Gross per 24 hour   Intake    679 ml  Output   1050 ml  Net   -371 ml    Last BM: none recorded   Labs:   Recent Labs Lab 12/01/13 1759 12/02/13 0340  NA 134* 136  K 3.7 4.0  CL 96 98  CO2 29 25  BUN 24* 25*  CREATININE 0.86 0.95  CALCIUM 9.5 9.6  GLUCOSE 174* 168*    CBG (last 3)  No results found for this basename: GLUCAP,  in the last 72 hours  Scheduled Meds: . albuterol  2.5 mg Nebulization TID  . aspirin  325 mg Oral Daily  . atenolol  100 mg Oral Daily  . cefTRIAXone (ROCEPHIN)  IV  1 g Intravenous Q24H  . cilostazol  100 mg Oral BID  . clopidogrel  75 mg Oral Once  . diltiazem  240 mg Oral Daily  . enoxaparin (LOVENOX) injection  30 mg Subcutaneous Q24H  . furosemide  40 mg Oral Daily  . pantoprazole (PROTONIX) IV  40 mg Intravenous Q24H  . predniSONE  20 mg Oral BID WC    Continuous Infusions: . sodium chloride 20 mL/hr at 12/01/13 1743    Past Medical History  Diagnosis Date  . CAD (coronary artery disease)   . Hyperlipidemia   . Osteoporosis   . PVD (peripheral vascular  disease)   . HTN (hypertension)   . Diverticulosis of colon   . GERD (gastroesophageal reflux disease)   . Hip fracture, right   . Decubitus ulcer     Right heel  . COPD (chronic obstructive pulmonary disease)   . Anxiety   . Anemia   . Esophageal stricture   . CHF (congestive heart failure)   . Atrial fibrillation   . Fibromuscular dysplasia     Renal artery  . Myocardial infarction     Past Surgical History  Procedure Laterality Date  . Ptca  04/2003  . Cataract extraction      Bilateral  . Orif acetabular fracture  2009    Ebbie Latus RD, LDN

## 2013-12-04 LAB — CBC WITH DIFFERENTIAL/PLATELET
Basophils Absolute: 0 10*3/uL (ref 0.0–0.1)
Basophils Relative: 0 % (ref 0–1)
Eosinophils Absolute: 0 10*3/uL (ref 0.0–0.7)
HCT: 41.1 % (ref 36.0–46.0)
Lymphocytes Relative: 3 % — ABNORMAL LOW (ref 12–46)
MCH: 28.6 pg (ref 26.0–34.0)
MCHC: 34.3 g/dL (ref 30.0–36.0)
Monocytes Absolute: 0.5 10*3/uL (ref 0.1–1.0)
Neutro Abs: 17.4 10*3/uL — ABNORMAL HIGH (ref 1.7–7.7)
Neutrophils Relative %: 95 % — ABNORMAL HIGH (ref 43–77)
Platelets: 221 10*3/uL (ref 150–400)
RDW: 13.6 % (ref 11.5–15.5)
WBC: 18.4 10*3/uL — ABNORMAL HIGH (ref 4.0–10.5)

## 2013-12-04 LAB — BASIC METABOLIC PANEL
BUN: 58 mg/dL — ABNORMAL HIGH (ref 6–23)
Creatinine, Ser: 1.67 mg/dL — ABNORMAL HIGH (ref 0.50–1.10)
GFR calc Af Amer: 29 mL/min — ABNORMAL LOW (ref >90)
GFR calc non Af Amer: 25 mL/min — ABNORMAL LOW (ref >90)

## 2013-12-04 LAB — URINE CULTURE: Colony Count: >=100000

## 2013-12-04 MED ORDER — SODIUM CHLORIDE 0.9 % IV SOLN
INTRAVENOUS | Status: DC
  Administered 2013-12-04 – 2013-12-08 (×6): via INTRAVENOUS

## 2013-12-04 MED ORDER — SODIUM CHLORIDE 0.9 % IV SOLN
250.0000 mg | Freq: Three times a day (TID) | INTRAVENOUS | Status: DC
  Administered 2013-12-04 – 2013-12-05 (×2): 250 mg via INTRAVENOUS
  Filled 2013-12-04 (×4): qty 250

## 2013-12-04 MED ORDER — LEVOFLOXACIN 750 MG PO TABS: 750.0000 mg | ORAL_TABLET | Freq: Every day | ORAL | Status: DC

## 2013-12-04 MED ORDER — LEVOFLOXACIN 750 MG PO TABS
750.0000 mg | ORAL_TABLET | Freq: Once | ORAL | Status: AC
  Administered 2013-12-04: 750 mg via ORAL
  Filled 2013-12-04: qty 1

## 2013-12-04 MED ORDER — PREDNISONE 20 MG PO TABS
20.0000 mg | ORAL_TABLET | Freq: Every day | ORAL | Status: DC
  Administered 2013-12-05 – 2013-12-08 (×4): 20 mg via ORAL
  Filled 2013-12-04 (×4): qty 1

## 2013-12-04 MED ORDER — LEVOFLOXACIN 500 MG PO TABS: 500.0000 mg | ORAL_TABLET | ORAL | Status: DC

## 2013-12-04 MED ORDER — PANTOPRAZOLE SODIUM 40 MG PO TBEC
40.0000 mg | DELAYED_RELEASE_TABLET | Freq: Every day | ORAL | Status: DC
  Administered 2013-12-04 – 2013-12-07 (×4): 40 mg via ORAL
  Filled 2013-12-04 (×6): qty 1

## 2013-12-04 NOTE — Evaluation (Signed)
Physical Therapy Evaluation Patient Details Name: [name] MRN: [id number] DOB: 09/09/1915 Today's Date: 12/04/2013 Time: 4098-1191 PT Time Calculation (min): 33 min  PT Assessment / Plan / Recommendation History of Present Illness  : [name] is a frail 79 y/o woman with h/o CAD, PVD, HTN, COPD, CHF who called the office today c/o being sick for a week but to weak to come to the office. She is very reliable, if not a bit stoic, about her health. She reports for a week she has been ill but in a non-specific way. She has had a little increased urinary frequency, dysuria, poor PO intake; she denies frank shortness of breath but has been less active. She reports that several days ago she fell on her way to the bathroom but denies any injury. Her bowels have been regular, she denies any cough, documented fever but she has felt feverish. She is now admitted with concern for decompensation CHF and possibly a UTI. She was recently treated for pneumonia and this may also be a poss  Clinical Impression  Pt mobilized to chair for a few minutes to brush her teeth. Pt reports that she will be home alone at times. Recommend that pt have 24/7 caregivers. Pt has not ambulated. lted as yet. Pt will benefit from PT to address problems. Pt may benefit from SNF if pt agrees.    PT Assessment  Patient needs continued PT services    Follow Up Recommendations  SNF;Home health PT;Supervision/Assistance - 24 hour (no family available to discuss.)    Does the patient have the potential to tolerate intense rehabilitation      Barriers to Discharge        Equipment Recommendations  None recommended by PT    Recommendations for Other Services     Frequency Min 3X/week    Precautions / Restrictions Precautions Precautions: Fall   Pertinent Vitals/Pain HR 100-115. sats 90's on 2 l,      Mobility  Bed Mobility Bed Mobility: Supine to Sit;Sit to Supine Supine to Sit: 3: Mod assist Sit to Supine:  4: Min guard Details for Bed Mobility Assistance: assistance to geet into sitting upright. Transfers Transfers: Sit to Stand;Stand to Sit;Stand Pivot Transfers Sit to Stand: 3: Mod assist;From bed;From chair/3-in-1;With upper extremity assist Stand to Sit: To bed;To chair/3-in-1;With upper extremity assist Stand Pivot Transfers: 3: Mod assist Details for Transfer Assistance: lifting assistance to get into standing, more difficulty from low chair. Ambulation/Gait Ambulation/Gait Assistance: Not tested (comment) Ambulation/Gait Assistance Details: pt was reluctant to even get OOB.    Exercises     PT Diagnosis: Difficulty walking;Generalized weakness  PT Problem List: Decreased strength;Decreased activity tolerance;Decreased mobility;Decreased safety awareness;Decreased knowledge of use of DME PT Treatment Interventions: DME instruction;Gait training;Functional mobility training;Therapeutic activities;Therapeutic exercise;Patient/family education     PT Goals(Current goals can be found in the care plan section) Acute Rehab PT Goals Patient Stated Goal: I want to go home tomorrow. PT Goal Formulation: With patient Time For Goal Achievement: 12/18/13  Visit Information  Last PT Received On: 12/04/13 Assistance Needed: +1 History of Present Illness: : [name] is a frail 79 y/o woman with h/o CAD, PVD, HTN, COPD, CHF who called the office today c/o being sick for a week but to weak to come to the office. She is very reliable, if not a bit stoic, about her health. She reports for a week she has been ill but in a non-specific way. She has had a little increased  urinary frequency, dysuria, poor PO intake; she denies frank shortness of breath but has been less active. She reports that several days ago she fell on her way to the bathroom but denies any injury. Her bowels have been regular, she denies any cough, documented fever but she has felt feverish. She is now admitted with concern for  decompensation CHF and possibly a UTI. She was recently treated for pneumonia and this may also be a poss       Prior Functioning  Home Living Family/patient expects to be discharged to:: Private residence Living Arrangements: Other relatives Available Help at Discharge: Family;Available PRN/intermittently Type of Home: House Home Layout: One level Home Equipment: Walker - 2 wheels;Bedside commode Prior Function Level of Independence: Independent with assistive device(s);Needs assistance ADL's / Homemaking Assistance Needed: grandson assists with house chores Communication Communication: No difficulties    Cognition  Cognition Arousal/Alertness: Awake/alert Behavior During Therapy: WFL for tasks assessed/performed Overall Cognitive Status: Within Functional Limits for tasks assessed General Comments: pt reports when she goes home she will be alone and does not see that as a problem    Extremity/Trunk Assessment Upper Extremity Assessment Upper Extremity Assessment: Generalized weakness Lower Extremity Assessment Lower Extremity Assessment: Generalized weakness Cervical / Trunk Assessment Cervical / Trunk Assessment: Normal   Balance Balance Balance Assessed: Yes Static Standing Balance Static Standing - Level of Assistance: 4: Min assist Static Standing - Comment/# of Minutes: assist for steadying at Rw once standing.  End of Session PT - End of Session Activity Tolerance: Patient tolerated treatment well Patient left: in bed;with call bell/phone within reach;with bed alarm set Nurse Communication: Mobility status  GP     Rada Hay 12/04/2013, 3:26 PM Blanchard Kelch PT [phone]

## 2013-12-04 NOTE — Progress Notes (Addendum)
ANTIBIOTIC CONSULT NOTE - INITIAL  Pharmacy Consult for Primaxin Indication: ESBL E.coli UTI, PNA  Allergies  Allergen Reactions  . Codeine Phosphate     REACTION: unspecified  . Doxycycline Other (See Comments)    unknown  . Erythromycin Other (See Comments)    unknown    Patient Measurements: Height: 5\' 3"  (160 cm) Weight: 131 lb 6.3 oz (59.6 kg) IBW/kg (Calculated) : 52.4  Vital Signs: Temp: 97.5 F (36.4 C) (12/15 2124) Temp src: Oral (12/15 2124) BP: 125/67 mmHg (12/15 2124) Pulse Rate: 76 (12/15 2124) Intake/Output from previous day: 12/14 0701 - 12/15 0700 In: 242.3 [I.V.:192.3; IV Piggyback:50] Out: 560 [Urine:560] Intake/Output from this shift: Total I/O In: -  Out: 150 [Urine:150]  Labs:  Recent Labs  12/02/13 0340 12/04/13 0347  WBC 15.0* 18.4*  HGB 15.3* 14.1  PLT 206 221  CREATININE 0.95 1.67*   Estimated Creatinine Clearance: 16.3 ml/min (by C-G formula based on Cr of 1.67). No results found for this basename: VANCOTROUGH, Leodis Binet, VANCORANDOM, GENTTROUGH, GENTPEAK, GENTRANDOM, TOBRATROUGH, TOBRAPEAK, TOBRARND, AMIKACINPEAK, AMIKACINTROU, AMIKACIN,  in the last 72 hours   Microbiology: Recent Results (from the past 720 hour(s))  MRSA PCR SCREENING     Status: None   Collection Time    12/01/13  4:25 PM      Result Value Range Status   MRSA by PCR NEGATIVE  NEGATIVE Final   Comment:            The GeneXpert MRSA Assay (FDA     approved for NASAL specimens     only), is one component of a     comprehensive MRSA colonization     surveillance program. It is not     intended to diagnose MRSA     infection nor to guide or     monitor treatment for     MRSA infections.  URINE CULTURE     Status: None   Collection Time    12/01/13 10:50 PM      Result Value Range Status   Specimen Description URINE, CLEAN CATCH   Final   Special Requests NONE   Final   Culture  Setup Time     Final   Value: 12/02/2013 04:58     Performed at [facility] Count     Final   Value: >=100,000 COLONIES/ML     Performed at [facility]   Culture     Final   Value: ESCHERICHIA COLI     Note: Confirmed Extended Spectrum Beta-Lactamase Producer (ESBL) CRITICAL RESULT CALLED TO, READ BACK BY AND VERIFIED WITH: NEELY RICHARDSON @ 12:38PM 12/04/13 BY DWEEKS     Performed at [facility]   Report Status 12/04/2013 FINAL   Final   Organism ID, Bacteria ESCHERICHIA COLI   Final    Medical History: Past Medical History  Diagnosis Date  . CAD (coronary artery disease)   . Hyperlipidemia   . Osteoporosis   . PVD (peripheral vascular disease)   . HTN (hypertension)   . Diverticulosis of colon   . GERD (gastroesophageal reflux disease)   . Hip fracture, right   . Decubitus ulcer     Right heel  . COPD (chronic obstructive pulmonary disease)   . Anxiety   . Anemia   . Esophageal stricture   . CHF (congestive heart failure)   . Atrial fibrillation   . Fibromuscular dysplasia     Renal artery  . Myocardial infarction  Assessment: 81 yoF with h/o CAD, PVD, HTN, COPD, CHF admitted 12/12 for feeling sick x 1 week.  Patient is on day #3 antibiotics for UTI.  Urine culture growing ESBL E.coli.  Although, culture results show sensitivity to Zosyn, Zosyn is not recommended for ESBL organisms despite in vitro susceptibility.  Starting Primaxin per pharmacy.  Also, CXR 12/14 shows possible new infiltrate so will be covering for pneumonia as well.  WBC 18.4 (patient receiving steroids)  Afebrile  SCr bumped today to 1.67, CrCl~ 22 ml/min (normalized).  Possibly hypovolemic per MD.    Goal of Therapy:  Eradication of infection. Doses adjusted per renal clearance  Plan:  1.  Primaxin 250 mg IV q8h. 2.  F/u SCr for dose adjustments. 3.  If concerned that patient has developed a hospital-acquired pneumonia, would consider coverage of MRSA as well.  Clance Boll 12/04/2013,9:33 PM

## 2013-12-04 NOTE — Progress Notes (Signed)
Subjective: Awake and alert. She reports the oatmeal was really good this morning. She is worried about swelling with steroids.  Objective: Lab:  Recent Labs  12/01/13 1759 12/02/13 0340 12/04/13 0347  WBC 9.3 15.0* 18.4*  NEUTROABS 7.2  --  17.4*  HGB 14.5 15.3* 14.1  HCT 43.8 45.7 41.1  MCV 86.7 86.2 83.4  PLT 235 206 221    Recent Labs  12/01/13 1759 12/02/13 0340 12/04/13 0347  NA 134* 136 131*  K 3.7 4.0 4.1  CL 96 98 91*  GLUCOSE 174* 168* 160*  BUN 24* 25* 58*  CREATININE 0.86 0.95 1.67*  CALCIUM 9.5 9.6 9.4    Imaging: 12/03/13 cxr  Scheduled Meds: . albuterol  2.5 mg Nebulization TID  . aspirin  325 mg Oral Daily  . atenolol  100 mg Oral Daily  . cefTRIAXone (ROCEPHIN)  IV  1 g Intravenous Q24H  . cilostazol  100 mg Oral BID  . clopidogrel  75 mg Oral Once  . diltiazem  240 mg Oral Daily  . enoxaparin (LOVENOX) injection  30 mg Subcutaneous Q24H  . feeding supplement (ENSURE COMPLETE)  237 mL Oral BID BM  . furosemide  40 mg Oral Daily  . pantoprazole (PROTONIX) IV  40 mg Intravenous Q24H  . predniSONE  20 mg Oral BID WC   Continuous Infusions: . sodium chloride 20 mL/hr at 12/03/13 2123   PRN Meds:.acetaminophen, hydrALAZINE, LORazepam, morphine injection   Physical Exam: Filed Vitals:   12/04/13 0821  BP: 111/75  Pulse: 74  Temp:   Resp: 12   Gen'l - Elderly woman in no distress HEENT- C&S clear Cor - 2+ radial, IRIR rate controlled Pulm - good breath sounds, no wheezing Abd- soft Neuro -Awake and alert      Assessment/Plan: 1. Cardiac - BP stable, heart rate stable. 12 lead w/o acute injury. Plan Transfer to tele bed  No change in medications  2. ID - Day #2 Rocephin for U/A and now new infiltrate. Afebrile and lung exam is good. Urine culture pending Plan Change to PO levaquin for better lung coverage.  3. Hyponatremia - rise in Creatinine, I/O -1.3 l - may be a little hypovolemic Plan  NS at 50 cc/ hr  Bmet in  AM.   [name] [place] [facility] (o) [phone]; (c) [phone] Call-grp - Patsi Sears IM  Tele: [phone]  12/04/2013, 8:36 AM

## 2013-12-04 NOTE — Progress Notes (Addendum)
Pharmacist Heart Failure Core Measure Documentation  Assessment: [name] has an EF documented as 30% on 12/02/2013 by ECHO  Rationale: Heart failure patients with left ventricular systolic dysfunction (LVSD) and an EF < 40% should be prescribed an angiotensin converting enzyme inhibitor (ACEI) or angiotensin receptor blocker (ARB) at discharge unless a contraindication is documented in the medical record.  This patient is not currently on an ACEI or ARB for HF.  This note is being placed in the record in order to provide documentation that a contraindication to the use of these agents is present for this encounter.  ACE Inhibitor or Angiotensin Receptor Blocker is contraindicated (specify all that apply)  []   ACEI allergy AND ARB allergy []   Angioedema []   Moderate or severe aortic stenosis []   Hyperkalemia []   Hypotension []   Renal artery stenosis [x]   Worsening renal function, preexisting renal disease or dysfunction   [name] 12/04/2013 9:01 AM   --------------------------------------------------  PHARMACIST - PHYSICIAN COMMUNICATION CONCERNING:  IV to Oral Route Change Policy  RECOMMENDATION: This patient is receiving Protonix by the intravenous route.  Based on criteria approved by the Pharmacy and Therapeutics Committee, Protonix is being converted to the equivalent oral dose form(s).  DESCRIPTION: These criteria include:  Has a documented ability to take oral medications (tolerating diet of full liquids or better or  Gastric tube feedings for >24 hours OR taking other scheduled oral medications for >24 hours).  Expected plan for continued treatment for at least 1 day.  If you have questions about this conversion, please contact the Pharmacy Department  []   [phone] )  [name] []   [phone] )  [name]  []   [phone] )  [facility] [x]   [phone] )  [facility]    Ruth Paradise, PharmD, [place] Pager: [phone] 9:02  AM Pharmacy #: [phone]

## 2013-12-05 DIAGNOSIS — N390 Urinary tract infection, site not specified: Secondary | ICD-10-CM

## 2013-12-05 DIAGNOSIS — J189 Pneumonia, unspecified organism: Secondary | ICD-10-CM

## 2013-12-05 LAB — CBC WITH DIFFERENTIAL/PLATELET
Basophils Absolute: 0 10*3/uL (ref 0.0–0.1)
Basophils Relative: 0 % (ref 0–1)
Eosinophils Absolute: 0 10*3/uL (ref 0.0–0.7)
Lymphs Abs: 0.7 10*3/uL (ref 0.7–4.0)
MCH: 30.2 pg (ref 26.0–34.0)
MCV: 84.4 fL (ref 78.0–100.0)
Neutrophils Relative %: 87 % — ABNORMAL HIGH (ref 43–77)
Platelets: 215 10*3/uL (ref 150–400)
RBC: 4.5 MIL/uL (ref 3.87–5.11)
RDW: 13.7 % (ref 11.5–15.5)

## 2013-12-05 LAB — BASIC METABOLIC PANEL
Calcium: 8.9 mg/dL (ref 8.4–10.5)
Creatinine, Ser: 1.41 mg/dL — ABNORMAL HIGH (ref 0.50–1.10)
GFR calc Af Amer: 35 mL/min — ABNORMAL LOW (ref >90)
GFR calc non Af Amer: 30 mL/min — ABNORMAL LOW (ref >90)
Glucose, Bld: 144 mg/dL — ABNORMAL HIGH (ref 70–99)
Potassium: 3.6 mEq/L (ref 3.5–5.1)
Sodium: 134 mEq/L — ABNORMAL LOW (ref 135–145)

## 2013-12-05 MED ORDER — SULFAMETHOXAZOLE-TRIMETHOPRIM 400-80 MG PO TABS
1.0000 | ORAL_TABLET | Freq: Two times a day (BID) | ORAL | Status: DC
  Administered 2013-12-05 – 2013-12-08 (×7): 1 via ORAL
  Filled 2013-12-05 (×8): qty 1

## 2013-12-05 MED ORDER — SULFAMETHOXAZOLE-TMP DS 800-160 MG PO TABS: 1.0000 | ORAL_TABLET | Freq: Two times a day (BID) | ORAL | Status: DC

## 2013-12-05 MED ORDER — CLOPIDOGREL BISULFATE 75 MG PO TABS
75.0000 mg | ORAL_TABLET | Freq: Every day | ORAL | Status: DC
  Administered 2013-12-05 – 2013-12-08 (×4): 75 mg via ORAL
  Filled 2013-12-05 (×6): qty 1

## 2013-12-05 MED ORDER — SODIUM CHLORIDE 0.9 % IV SOLN
250.0000 mg | Freq: Two times a day (BID) | INTRAVENOUS | Status: DC
  Administered 2013-12-05 – 2013-12-08 (×6): 250 mg via INTRAVENOUS
  Filled 2013-12-05 (×7): qty 250

## 2013-12-05 NOTE — Care Management Note (Signed)
    Page 1 of 1   12/05/2013     11:35:25 AM   CARE MANAGEMENT NOTE 12/05/2013  Patient:  [name], [name]   Account Number:  [id number]  Date Initiated:  12/02/2013  Documentation initiated by:  DAVIS,TYMEEKA  Subjective/Objective Assessment:   79 yo female admitted with CHF.     Action/Plan:   FROM HOME W/SON.   Anticipated DC Date:  12/07/2013   Anticipated DC Plan:  SKILLED NURSING FACILITY      DC Planning Services  CM consult      Choice offered to / List presented to:             Status of service:  In process, will continue to follow Medicare Important Message given?   (If response is "NO", the following Medicare IM given date fields will be blank) Date Medicare IM given:   Date Additional Medicare IM given:    Discharge Disposition:    Per UR Regulation:  Reviewed for med. necessity/level of care/duration of stay  If discussed at Long Length of Stay Meetings, dates discussed:    Comments:  12/05/13 Hesper Venturella RN,BSN NCM [phone] PT-SNF.URINE-E.COLI.IV ABX,LLL INFILTRATES.  12/02/13 1133 Tymeeka Davis,RN,MSN [phone] UR complete

## 2013-12-05 NOTE — Progress Notes (Signed)
ANTIBIOTIC CONSULT NOTE   Pharmacy Consult for Primaxin Indication: ESBL E.coli UTI, PNA  Allergies  Allergen Reactions  . Codeine Phosphate     REACTION: unspecified  . Doxycycline Other (See Comments)    unknown  . Erythromycin Other (See Comments)    unknown    Patient Measurements: Height: 5\' 3"  (160 cm) Weight: 131 lb 6.3 oz (59.6 kg) IBW/kg (Calculated) : 52.4  Vital Signs: Temp: 97.7 F (36.5 C) (12/16 0541) Temp src: Oral (12/16 0541) BP: 119/52 mmHg (12/16 0541) Pulse Rate: 99 (12/16 0541) Intake/Output from previous day: 12/15 0701 - 12/16 0700 In: 1911.7 [P.O.:760; I.V.:901.7; IV Piggyback:250] Out: 1425 [Urine:1425] Intake/Output from this shift: Total I/O In: 240 [P.O.:240] Out: -   Labs:  Recent Labs  12/04/13 0347 12/05/13 0508  WBC 18.4* 12.8*  HGB 14.1 13.6  PLT 221 215  CREATININE 1.67* 1.41*   Estimated Creatinine Clearance: 19.3 ml/min (by C-G formula based on Cr of 1.41). No results found for this basename: VANCOTROUGH, Leodis Binet, VANCORANDOM, GENTTROUGH, GENTPEAK, GENTRANDOM, TOBRATROUGH, TOBRAPEAK, TOBRARND, AMIKACINPEAK, AMIKACINTROU, AMIKACIN,  in the last 72 hours   Microbiology: Recent Results (from the past 720 hour(s))  MRSA PCR SCREENING     Status: None   Collection Time    12/01/13  4:25 PM      Result Value Range Status   MRSA by PCR NEGATIVE  NEGATIVE Final   Comment:            The GeneXpert MRSA Assay (FDA     approved for NASAL specimens     only), is one component of a     comprehensive MRSA colonization     surveillance program. It is not     intended to diagnose MRSA     infection nor to guide or     monitor treatment for     MRSA infections.  URINE CULTURE     Status: None   Collection Time    12/01/13 10:50 PM      Result Value Range Status   Specimen Description URINE, CLEAN CATCH   Final   Special Requests NONE   Final   Culture  Setup Time     Final   Value: 12/02/2013 04:58     Performed at [facility] Count     Final   Value: >=100,000 COLONIES/ML     Performed at [facility]   Culture     Final   Value: ESCHERICHIA COLI     Note: Confirmed Extended Spectrum Beta-Lactamase Producer (ESBL) CRITICAL RESULT CALLED TO, READ BACK BY AND VERIFIED WITH: NEELY RICHARDSON @ 12:38PM 12/04/13 BY DWEEKS     Performed at [facility]   Report Status 12/04/2013 FINAL   Final   Organism ID, Bacteria ESCHERICHIA COLI   Final    Medical History: Past Medical History  Diagnosis Date  . CAD (coronary artery disease)   . Hyperlipidemia   . Osteoporosis   . PVD (peripheral vascular disease)   . HTN (hypertension)   . Diverticulosis of colon   . GERD (gastroesophageal reflux disease)   . Hip fracture, right   . Decubitus ulcer     Right heel  . COPD (chronic obstructive pulmonary disease)   . Anxiety   . Anemia   . Esophageal stricture   . CHF (congestive heart failure)   . Atrial fibrillation   . Fibromuscular dysplasia     Renal artery  . Myocardial infarction  Assessment: 89 yoF with h/o CAD, PVD, HTN, COPD, CHF admitted 12/12 for feeling sick x 1 week.  Patient is on day #3 antibiotics for UTI.  Urine culture growing ESBL E.coli.  Although, culture results show sensitivity to Zosyn, Zosyn is not recommended for ESBL organisms despite in vitro susceptibility.  Starting Primaxin per pharmacy.  Also, CXR 12/14 shows possible new infiltrate so will be covering for pneumonia as well.  12/12 >> CTX >> 12/15 12/15 >> Levaquin >> 12/15 12/15 >> Primaxin >> 12/16 >> TMP/SMZ (MD) >>  Tmax: AF WBC 12.8 (patient receiving steroids) Renal: SCr = 1.41, CrCl~ 19ml/min (C-G)  Goal of Therapy:  Eradication of infection. Doses adjusted per renal clearance  Plan:   Change primaxin to 250mg  IV q12h for CrCl < 70ml/min  Juliette Alcide, PharmD, BCPS.   Pager: [phone]  12/05/2013,11:59 AM

## 2013-12-05 NOTE — Clinical Social Work Psychosocial (Signed)
     Clinical Social Work Department BRIEF PSYCHOSOCIAL ASSESSMENT 12/05/2013  Patient:  Ruth, [name]     Account Number:  [id number]     Admit date:  12/01/2013  Clinical Social Worker:  Hattie Perch  Date/Time:  12/05/2013 12:00 M  Referred by:  Physician  Date Referred:  12/05/2013 Referred for  SNF Placement   Other Referral:   Interview type:  Patient Other interview type:    PSYCHOSOCIAL DATA Living Status:  FAMILY Admitted from facility:   Level of care:   Primary support name:  Mickeal Skinner Primary support relationship to patient:  CHILD, ADULT Degree of support available:   good    CURRENT CONCERNS Current Concerns  Post-Acute Placement   Other Concerns:    SOCIAL WORK ASSESSMENT / PLAN CSW met with patient. patient is alert and oriented X3. patient in need of snf placement. Patient is unhappy about the fact that she needs snf but is understanding of why it has been recommended. Patient lives with her grandson who works for a Estate manager/land agent and sometimes comes home late. She is realistic about the fact that she is unable to go home by herself right now   Assessment/plan status:   Other assessment/ plan:   Information/referral to community resources:    PATIENTS/FAMILYS RESPONSE TO PLAN OF CARE: patient would like a snf that is convenient for both her son and her daughter to visit her at. She understands her insurance limitations.

## 2013-12-05 NOTE — Progress Notes (Signed)
Subjective: [name] is awake and alert and reports that she feels better.  No chest pain, no SOB  Objective: Lab:  Recent Labs  12/04/13 0347 12/05/13 0508  WBC 18.4* 12.8*  NEUTROABS 17.4* 11.2*  HGB 14.1 13.6  HCT 41.1 38.0  MCV 83.4 84.4  PLT 221 215    Recent Labs  12/04/13 0347 12/05/13 0508  NA 131* 134*  K 4.1 3.6  CL 91* 96  GLUCOSE 160* 144*  BUN 58* 58*  CREATININE 1.67* 1.41*  CALCIUM 9.4 8.9   Urine culture - E. Coli - ESBL  12 Lead EKG with A. Fib - an established diagnosis  Imaging: no new imaging  Scheduled Meds: . albuterol  2.5 mg Nebulization TID  . aspirin  325 mg Oral Daily  . atenolol  100 mg Oral Daily  . cilostazol  100 mg Oral BID  . clopidogrel  75 mg Oral Once  . diltiazem  240 mg Oral Daily  . enoxaparin (LOVENOX) injection  30 mg Subcutaneous Q24H  . feeding supplement (ENSURE COMPLETE)  237 mL Oral BID BM  . furosemide  40 mg Oral Daily  . imipenem-cilastatin  250 mg Intravenous Q8H  . pantoprazole  40 mg Oral QHS  . predniSONE  20 mg Oral Daily   Continuous Infusions: . sodium chloride 50 mL/hr at 12/04/13 2220   PRN Meds:.acetaminophen, hydrALAZINE, LORazepam, morphine injection   Physical Exam: Filed Vitals:   12/05/13 0541  BP: 119/52  Pulse: 99  Temp: 97.7 F (36.5 C)  Resp: 24   Gen'l - elderly woman in no distress HEENT - normal Cor 2+ radial, quiet precordium, IRIR II/VI mm RSB Pulm - rales at the left base. No increased WOB, No wheezing Abd- soft Neuro - A&O x 3      Assessment/Plan: 1. Cardiac - patient with h/o a. Fib. Currently rate controlled. BP stable  Plan No change in therapy  2. ID - completed 2/2 Rocephin. Urine culture with ESBL E. Coli - day #1 primaxim. She did develop an infiltrate and rales LLL. Pharmacy recommendation note re: coverage for MRSA. Plan Add Septra DS bid  3. Hyponatremia - improved. Creatinine improved. No signs fluid overload  4. Dispo - PT note reviewed.  Patient does not have help at home. She will need ST-SNF Plan Soc work consult Plan  Continue NS another day.    [name] [place] [facility] (o) [phone]; (c) [phone] Call-grp - [facility]  Tele: [phone]  12/05/2013, 7:57 AM

## 2013-12-05 NOTE — Progress Notes (Signed)
FYI--Pt continues with Foley catheter. Also, has dressings to both feet that pt refuses to let nurse look at. Can MD please question pt about care of these wounds? Nurse sees no "notes" about these bilateral foot dressings. Pt very adamant about nurse not removing them. Will report to oncoming nurse for encouragement to pt for nurse assessment.

## 2013-12-05 NOTE — Progress Notes (Signed)
Physical Therapy Treatment Patient Details Name: ARIANI SEIER MRN: [id number] DOB: 01/25/1915 Today's Date: 12/05/2013 Time: 4098-1191 PT Time Calculation (min): 14 min  PT Assessment / Plan / Recommendation  History of Present Illness : Mrs. Vessel is a frail 79 y/o woman with h/o CAD, PVD, HTN, COPD, CHF who called the office today c/o being sick for a week but to weak to come to the office. She is very reliable, if not a bit stoic, about her health. She reports for a week she has been ill but in a non-specific way. She has had a little increased urinary frequency, dysuria, poor PO intake; she denies frank shortness of breath but has been less active. She reports that several days ago she fell on her way to the bathroom but denies any injury. Her bowels have been regular, she denies any cough, documented fever but she has felt feverish. She is now admitted with concern for decompensation CHF and possibly a UTI. She was recently treated for pneumonia and this may also be a poss   PT Comments   Pt reports being very tired. Declined to walk.Plans for SNF.  Follow Up Recommendations  SNF;Supervision/Assistance - 24 hour     Does the patient have the potential to tolerate intense rehabilitation     Barriers to Discharge        Equipment Recommendations  None recommended by PT    Recommendations for Other Services    Frequency Min 3X/week   Progress towards PT Goals Progress towards PT goals: Progressing toward goals  Plan Current plan remains appropriate    Precautions / Restrictions Precautions Precautions: Fall   Pertinent Vitals/Pain     Mobility  Bed Mobility Sit to Supine: 4: Min assist Details for Bed Mobility Assistance: asssit legs onto bed. Transfers Sit to Stand: 3: Mod assist;From chair/3-in-1;With upper extremity assist Stand to Sit: To bed;To chair/3-in-1;With upper extremity assist;4: Min assist Stand Pivot Transfers: 3: Mod assist Details for Transfer  Assistance: lifting assistance to get into standing, cues for hand placement.    Exercises     PT Diagnosis:    PT Problem List:   PT Treatment Interventions:     PT Goals (current goals can now be found in the care plan section)    Visit Information  Last PT Received On: 12/05/13 History of Present Illness: : Mrs. Kobs is a frail 79 y/o woman with h/o CAD, PVD, HTN, COPD, CHF who called the office today c/o being sick for a week but to weak to come to the office. She is very reliable, if not a bit stoic, about her health. She reports for a week she has been ill but in a non-specific way. She has had a little increased urinary frequency, dysuria, poor PO intake; she denies frank shortness of breath but has been less active. She reports that several days ago she fell on her way to the bathroom but denies any injury. Her bowels have been regular, she denies any cough, documented fever but she has felt feverish. She is now admitted with concern for decompensation CHF and possibly a UTI. She was recently treated for pneumonia and this may also be a poss    Subjective Data      Cognition  Cognition Arousal/Alertness: Awake/alert Behavior During Therapy: WFL for tasks assessed/performed Overall Cognitive Status: Within Functional Limits for tasks assessed    Balance     End of Session PT - End of Session Activity Tolerance: Patient limited by  fatigue Patient left: in bed;with call bell/phone within reach;with bed alarm set Nurse Communication: Mobility status   GP     Rada Hay 12/05/2013, 5:15 PM

## 2013-12-05 NOTE — Progress Notes (Signed)
Provided patient with  Bed offers. Patient is agreeable to [facility]. Information sent to [facility] to start auth process for tomorrow.  Shawndell Varas C. Lorie Melichar MSW, LCSW [phone]

## 2013-12-06 LAB — URINALYSIS, ROUTINE W REFLEX MICROSCOPIC
Glucose, UA: NEGATIVE mg/dL
Nitrite: NEGATIVE
Protein, ur: NEGATIVE mg/dL
Specific Gravity, Urine: 1.017 (ref 1.005–1.030)
pH: 6.5 (ref 5.0–8.0)

## 2013-12-06 LAB — BASIC METABOLIC PANEL
BUN: 43 mg/dL — ABNORMAL HIGH (ref 6–23)
CO2: 29 mEq/L (ref 19–32)
Chloride: 99 mEq/L (ref 96–112)
GFR calc Af Amer: 43 mL/min — ABNORMAL LOW (ref >90)
GFR calc non Af Amer: 37 mL/min — ABNORMAL LOW (ref >90)
Potassium: 3.5 mEq/L (ref 3.5–5.1)
Sodium: 136 mEq/L (ref 135–145)

## 2013-12-06 LAB — CBC
HCT: 41.5 % (ref 36.0–46.0)
MCV: 84.9 fL (ref 78.0–100.0)
Platelets: 196 10*3/uL (ref 150–400)
RBC: 4.89 MIL/uL (ref 3.87–5.11)
RDW: 13.8 % (ref 11.5–15.5)
WBC: 9.5 10*3/uL (ref 4.0–10.5)

## 2013-12-06 LAB — URINE MICROSCOPIC-ADD ON

## 2013-12-06 MED ORDER — FLEET ENEMA 7-19 GM/118ML RE ENEM: 1.0000 | ENEMA | Freq: Once | RECTAL | Status: DC

## 2013-12-06 MED ORDER — BISACODYL 10 MG RE SUPP
10.0000 mg | Freq: Once | RECTAL | Status: AC
  Administered 2013-12-06: 10 mg via RECTAL
  Filled 2013-12-06: qty 1

## 2013-12-06 MED ORDER — FLEET ENEMA 7-19 GM/118ML RE ENEM: 1.0000 | ENEMA | Freq: Once | RECTAL | Status: AC | PRN

## 2013-12-06 NOTE — Progress Notes (Signed)
Subjective: Awake and alert w/o complaints except wanting to go home  Objective: Lab:  Recent Labs  12/04/13 0347 12/05/13 0508 12/06/13 0520  WBC 18.4* 12.8* 9.5  NEUTROABS 17.4* 11.2*  --   HGB 14.1 13.6 14.1  HCT 41.1 38.0 41.5  MCV 83.4 84.4 84.9  PLT 221 215 196    Recent Labs  12/04/13 0347 12/05/13 0508  NA 131* 134*  K 4.1 3.6  CL 91* 96  GLUCOSE 160* 144*  BUN 58* 58*  CREATININE 1.67* 1.41*  CALCIUM 9.4 8.9    Imaging:  Scheduled Meds: . albuterol  2.5 mg Nebulization TID  . aspirin  325 mg Oral Daily  . atenolol  100 mg Oral Daily  . cilostazol  100 mg Oral BID  . clopidogrel  75 mg Oral Once  . clopidogrel  75 mg Oral Q breakfast  . diltiazem  240 mg Oral Daily  . enoxaparin (LOVENOX) injection  30 mg Subcutaneous Q24H  . feeding supplement (ENSURE COMPLETE)  237 mL Oral BID BM  . furosemide  40 mg Oral Daily  . imipenem-cilastatin  250 mg Intravenous Q12H  . pantoprazole  40 mg Oral QHS  . predniSONE  20 mg Oral Daily  . sulfamethoxazole-trimethoprim  1 tablet Oral Q12H   Continuous Infusions: . sodium chloride 50 mL/hr at 12/05/13 1733   PRN Meds:.acetaminophen, hydrALAZINE, LORazepam, morphine injection   Physical Exam: Filed Vitals:   12/06/13 0501  BP: 122/55  Pulse: 113  Temp: 97.6 F (36.4 C)  Resp: 20   gen'l - elderly woman in no distress HEENT - C&S clear Neck - supple Cor 2+ radial pulse, IRIR rate controlled Pulm Rales at left base, no wheezing Ext - heel and ankle protective dressings in place. Distal foot wrapped to hold toe spacers (rolled gauze) in place     Assessment/Plan: 1. Cardiac - controlled a. Fib. BP stable. Acute pulmonary edema resolved. PLan Continue present regimen\  2. ID - Day #3 primaxim (EXBL UTI), #2 septra (for MRSA coverage in setting of HAP PNA) Plan Continue present regimen  U/A in AM - test for cure  D/C foley cath  3. Hyponatremia - NA 134 Plan Continue slow NS infusion  4. Dispo  for SNF. Looking to Friday as possible d/c date.   [name] [place] [facility] (o) [phone]; (c) [phone] Call-grp - [facility]  Tele: [phone]  12/06/2013, 7:16 AM

## 2013-12-06 NOTE — Progress Notes (Signed)
Patients LBM was PTA, notified MD and rec'd an order for dulcolex suppository and fleet enema if suppository does not work. Patient requested to get suppository only after she ate dinner, as she is up in the chair and will have to return to bed. RN will check back with patient when finished with dinner and carry out orders.

## 2013-12-07 ENCOUNTER — Inpatient Hospital Stay (HOSPITAL_COMMUNITY): Payer: Medicare PPO

## 2013-12-07 MED ORDER — ALBUTEROL SULFATE (5 MG/ML) 0.5% IN NEBU
2.5000 mg | INHALATION_SOLUTION | Freq: Two times a day (BID) | RESPIRATORY_TRACT | Status: DC
  Administered 2013-12-07 – 2013-12-08 (×3): 2.5 mg via RESPIRATORY_TRACT
  Filled 2013-12-07 (×3): qty 0.5

## 2013-12-07 NOTE — Progress Notes (Signed)
Physical Therapy Treatment Patient Details Name: [name] MRN: [id number] DOB: 05-14-1915 Today's Date: 12/07/2013 Time: 2956-2130 PT Time Calculation (min): 16 min  PT Assessment / Plan / Recommendation  History of Present Illness : [name] is a frail 79 y/o woman with h/o CAD, PVD, HTN, COPD, CHF who called the office today c/o being sick for a week but to weak to come to the office. She is very reliable, if not a bit stoic, about her health. She reports for a week she has been ill but in a non-specific way. She has had a little increased urinary frequency, dysuria, poor PO intake; she denies frank shortness of breath but has been less active. She reports that several days ago she fell on her way to the bathroom but denies any injury. Her bowels have been regular, she denies any cough, documented fever but she has felt feverish. She is now admitted with concern for decompensation CHF and possibly a UTI. She was recently treated for pneumonia and this may also be a poss   PT Comments   Pt OOB in recliner chair.  Assisted with standing.  Pt demon posterior lean/LOB with poor self righting to midline.  Assisted with amb limited distance 2nd MAX c/o fatigue/weakness.  Pt will need ST Rehab at SNF prior to D/C to home.   Follow Up Recommendations  SNF     Does the patient have the potential to tolerate intense rehabilitation     Barriers to Discharge        Equipment Recommendations       Recommendations for Other Services    Frequency Min 3X/week   Progress towards PT Goals Progress towards PT goals: Progressing toward goals  Plan      Precautions / Restrictions Precautions Precautions: Fall Restrictions Weight Bearing Restrictions: No    Pertinent Vitals/Pain No c/o pain     Mobility  Bed Mobility Bed Mobility: Not assessed Details for Bed Mobility Assistance: Pt OOB in recliner Transfers Transfers: Sit to Stand;Stand to Sit;Stand Pivot Transfers Sit to Stand: 3:  Mod assist;From chair/3-in-1;With upper extremity assist Stand to Sit: To bed;To chair/3-in-1;With upper extremity assist;4: Min assist Stand Pivot Transfers: 3: Mod assist Details for Transfer Assistance: lifting assistance to get into standing, cues for hand placement. Ambulation/Gait Ambulation/Gait Assistance: 3: Mod assist;4: Min assist Ambulation Distance (Feet): 35 Feet Assistive device: Rolling walker Ambulation/Gait Assistance Details: max encouragement and increased time.  Pt required assistance due to balance deficits and demon limited activity tolerance/amb distance.   Gait Pattern: Step-to pattern;Step-through pattern;Shuffle;Trunk flexed Gait velocity: decreased General Gait Details: HIGH FALL RISK     PT Goals (current goals can now be found in the care plan section)    Visit Information  Last PT Received On: 12/07/13 Assistance Needed: +1 History of Present Illness: : [name] is a frail 79 y/o woman with h/o CAD, PVD, HTN, COPD, CHF who called the office today c/o being sick for a week but to weak to come to the office. She is very reliable, if not a bit stoic, about her health. She reports for a week she has been ill but in a non-specific way. She has had a little increased urinary frequency, dysuria, poor PO intake; she denies frank shortness of breath but has been less active. She reports that several days ago she fell on her way to the bathroom but denies any injury. Her bowels have been regular, she denies any cough, documented fever but she has  felt feverish. She is now admitted with concern for decompensation CHF and possibly a UTI. She was recently treated for pneumonia and this may also be a poss    Subjective Data      Cognition       Balance     End of Session PT - End of Session Equipment Utilized During Treatment: Gait belt Activity Tolerance: Patient limited by fatigue Patient left: in chair;with call bell/phone within reach   Felecia Shelling  PTA [facility]   Acute  Rehab Pager      [phone]

## 2013-12-07 NOTE — Progress Notes (Signed)
Subjective: She is in good spirits w/o complaints. She is willing to take advantage of SNF  Objective: Lab:  Recent Labs  12/05/13 0508 12/06/13 0520  WBC 12.8* 9.5  NEUTROABS 11.2*  --   HGB 13.6 14.1  HCT 38.0 41.5  MCV 84.4 84.9  PLT 215 196    Recent Labs  12/05/13 0508 12/06/13 0829  NA 134* 136  K 3.6 3.5  CL 96 99  GLUCOSE 144* 181*  BUN 58* 43*  CREATININE 1.41* 1.19*  CALCIUM 8.9 8.7   Urine - WBC 0-2, rare bacteria, Sp Gr 1.017  Imaging:  Scheduled Meds: . albuterol  2.5 mg Nebulization BID  . aspirin  325 mg Oral Daily  . atenolol  100 mg Oral Daily  . cilostazol  100 mg Oral BID  . clopidogrel  75 mg Oral Once  . clopidogrel  75 mg Oral Q breakfast  . diltiazem  240 mg Oral Daily  . enoxaparin (LOVENOX) injection  30 mg Subcutaneous Q24H  . feeding supplement (ENSURE COMPLETE)  237 mL Oral BID BM  . furosemide  40 mg Oral Daily  . imipenem-cilastatin  250 mg Intravenous Q12H  . pantoprazole  40 mg Oral QHS  . predniSONE  20 mg Oral Daily  . sodium phosphate  1 enema Rectal Once  . sulfamethoxazole-trimethoprim  1 tablet Oral Q12H   Continuous Infusions: . sodium chloride 50 mL/hr at 12/07/13 0956   PRN Meds:.acetaminophen, hydrALAZINE, LORazepam, morphine injection   Physical Exam: Filed Vitals:   12/07/13 1440  BP: 135/66  Pulse: 78  Temp: 97.8 F (36.6 C)  Resp: 18   gen'l - elderly woman in no distress HEENT - C&S clear Cor - 2+ radial, IRIR Pulm - unlabored respirations Abd- soft.     Assessment/Plan: 1. Cardiac - rate controlled a. Fib. No sign of pulmonary edema  2. ID - Day #4 primaxim (URI), day #2 septra. No fever. WBC normal. U/A much better. Plan Will d/c primaxim after dose 12/19.  Will continue po septra for a full 7 days  3. Hyponatremia Resolved  For SNF 12/19   [name] [place] [facility] (o) [phone]; (c) [phone] Call-grp - Patsi Sears IM  Tele: [phone]  12/07/2013, 7:00 PM

## 2013-12-08 LAB — CREATININE, SERUM: Creatinine, Ser: 1.28 mg/dL — ABNORMAL HIGH (ref 0.50–1.10)

## 2013-12-08 MED ORDER — PREDNISONE 20 MG PO TABS: 20.0000 mg | ORAL_TABLET | Freq: Every day | ORAL | Status: DC

## 2013-12-08 MED ORDER — PANTOPRAZOLE SODIUM 40 MG PO TBEC: 40.0000 mg | DELAYED_RELEASE_TABLET | Freq: Every day | ORAL | Status: DC

## 2013-12-08 MED ORDER — ENSURE COMPLETE PO LIQD: 237.0000 mL | Freq: Two times a day (BID) | ORAL | Status: DC

## 2013-12-08 MED ORDER — SULFAMETHOXAZOLE-TRIMETHOPRIM 400-80 MG PO TABS: 1.0000 | ORAL_TABLET | Freq: Two times a day (BID) | ORAL | Status: DC

## 2013-12-08 NOTE — Progress Notes (Signed)
Physical Therapy Treatment Patient Details Name: BERENISE HUNTON MRN: [id number] DOB: 1915-11-25 Today's Date: 12/08/2013 Time: 4098-1191 PT Time Calculation (min): 24 min  PT Assessment / Plan / Recommendation  History of Present Illness : Mrs. Routson is a frail 79 y/o woman with h/o CAD, PVD, HTN, COPD, CHF who called the office today c/o being sick for a week but to weak to come to the office. She is very reliable, if not a bit stoic, about her health. She reports for a week she has been ill but in a non-specific way. She has had a little increased urinary frequency, dysuria, poor PO intake; she denies frank shortness of breath but has been less active. She reports that several days ago she fell on her way to the bathroom but denies any injury. Her bowels have been regular, she denies any cough, documented fever but she has felt feverish. She is now admitted with concern for decompensation CHF and possibly a UTI. She was recently treated for pneumonia and this may also be a poss   PT Comments   Pt OOB in recliner.  Assisted to BR then amb in hallway.  Pt progressing slowly and will need ST Rehab at SNF prior to D/C to home.   Follow Up Recommendations  SNF [facility])     Does the patient have the potential to tolerate intense rehabilitation     Barriers to Discharge        Equipment Recommendations       Recommendations for Other Services    Frequency Min 3X/week   Progress towards PT Goals Progress towards PT goals: Progressing toward goals  Plan      Precautions / Restrictions Precautions Precautions: Fall Restrictions Weight Bearing Restrictions: No    Pertinent Vitals/Pain No c/o pain    Mobility  Bed Mobility Bed Mobility: Not assessed Details for Bed Mobility Assistance: Pt OOB in recliner Transfers Transfers: Sit to Stand;Stand to Sit;Stand Pivot Transfers Sit to Stand: 3: Mod assist;From chair/3-in-1;With upper extremity assist;4: Min assist Stand to Sit: To  bed;To chair/3-in-1;With upper extremity assist;4: Min assist;4: Min guard Details for Transfer Assistance: lifting assistance to get into standing, cues for hand placement. Ambulation/Gait Ambulation/Gait Assistance: 3: Mod assist;4: Min assist Ambulation Distance (Feet): 48 Feet Ambulation/Gait Assistance Details: increased time and 25% VC's on safety with turns and proper walker to self distance at all times. Gait Pattern: Step-to pattern;Step-through pattern;Shuffle;Trunk flexed General Gait Details: HIGH FALL RISK     PT Goals (current goals can now be found in the care plan section)    Visit Information  Last PT Received On: 12/08/13 Assistance Needed: +1 History of Present Illness: : Mrs. Emrich is a frail 79 y/o woman with h/o CAD, PVD, HTN, COPD, CHF who called the office today c/o being sick for a week but to weak to come to the office. She is very reliable, if not a bit stoic, about her health. She reports for a week she has been ill but in a non-specific way. She has had a little increased urinary frequency, dysuria, poor PO intake; she denies frank shortness of breath but has been less active. She reports that several days ago she fell on her way to the bathroom but denies any injury. Her bowels have been regular, she denies any cough, documented fever but she has felt feverish. She is now admitted with concern for decompensation CHF and possibly a UTI. She was recently treated for pneumonia and this may also be  a poss    Subjective Data      Cognition       Balance     End of Session PT - End of Session Equipment Utilized During Treatment: Gait belt Activity Tolerance: Patient limited by fatigue Patient left: in chair;with call bell/phone within reach   Felecia Shelling  PTA [facility]  Acute  Rehab Pager      [phone]

## 2013-12-08 NOTE — Progress Notes (Signed)
Pt left with EMS at this time. Alert and without s/s of distress.

## 2013-12-08 NOTE — Progress Notes (Addendum)
Auth obtained from Cyril. Patient for snf at [facility] today.  [place] C. Keslie Gritz MSW, LCSW [phone] Patient cleared for discharge. Packet copied and placed in [place] [facility]. ptar called for transportation. Patient understands no guarantee of payment. Patient's daughter meeting her at facility. Patient agreeable to transfer and thanked CSW for assistance.  [place] Vandyne C. Tyniya Kuyper MSW, LCSW [phone]

## 2013-12-08 NOTE — Progress Notes (Signed)
Writer gave report to "[name]" at [facility]). Pt will be transferred to Room 1107 via EMS. Family and pt aware of transfer.

## 2013-12-08 NOTE — Progress Notes (Signed)
Patient is stable and ready for transfer to SNF when bed available. D/C primaxim after todays dose. Priority d/c done # [id number] Out of vacility order on chart FL2 signed Will need transport.   Pharmacy suggestion to reduce ASA to 81 mg daily noted and will defer to SNF MD

## 2013-12-08 NOTE — Clinical Social Work Placement (Signed)
     Clinical Social Work Department CLINICAL SOCIAL WORK PLACEMENT NOTE 12/08/2013  Patient:  [name], [name]  Account Number:  [id number] Admit date:  12/01/2013  Clinical Social Worker:  Becky Sax, LCSW  Date/time:  12/08/2013 12:00 M  Clinical Social Work is seeking post-discharge placement for this patient at the following level of care:   SKILLED NURSING   (*CSW will update this form in Epic as items are completed)   12/08/2013  Patient/family provided with [facility] Department of Clinical Social Works list of facilities offering this level of care within the geographic area requested by the patient (or if unable, by the patients family).  12/08/2013  Patient/family informed of their freedom to choose among providers that offer the needed level of care, that participate in Medicare, Medicaid or managed care program needed by the patient, have an available bed and are willing to accept the patient.  12/08/2013  Patient/family informed of [facility] ownership interest in [facility], as well as of the fact that they are under no obligation to receive care at this facility.  PASARR submitted to EDS on 12/08/2013 PASARR number received from EDS on 12/08/2013  FL2 transmitted to all facilities in geographic area requested by pt/family on  12/08/2013 FL2 transmitted to all facilities within larger geographic area on   Patient informed that his/her managed care company has contracts with or will negotiate with  certain facilities, including the following:     Patient/family informed of bed offers received:  12/08/2013 Patient chooses bed at [facility] Physician recommends and patient chooses bed at    Patient to be transferred to [facility] on  12/08/2013 Patient to be transferred to facility by ptar  The following physician request were entered in Epic:   Additional Comments:

## 2013-12-08 NOTE — Discharge Summary (Signed)
[name], [name]                 ACCOUNT NO.:  [id number]  MEDICAL RECORD NO.:  [id number]  LOCATION:  1408                         FACILITY:  [facility]  PHYSICIAN:  Rosalyn Gess. Norins, MD  DATE OF BIRTH:  07-Apr-1915  DATE OF ADMISSION:  12/01/2013 DATE OF DISCHARGE:  12/08/2013                              DISCHARGE SUMMARY   CONSULTANTS:  None.  ADMITTING DIAGNOSES: 1. Cardiac, the patient with an atypical presentation possible     congestive heart failure. 2. ID, possible urinary tract infection.  DISCHARGE DIAGNOSES: 1. Cardiac, the  patient with rate controlled atrial fibrillation, and     resolved flash pulmonary edema. 2. Urinary tract infection with the extended-spectrum beta-lactamase     Escherichia coli.  3. hospital-acquired pneumonia. 4. Hyponatremia. 5. Deconditioning.   PROCEDURES:  Imaging;  1. chest x-ray on December 01, 2013, which showed enlargement of cardiac silhouette with pulmonary vascular congestion. Mild CHF with small dependent pleural effusions bilaterally.  2. Chest x-ray on December 03, 2013, which showed congestive heart failure with new left basilar infiltrate.  3. Chest x-ray on December 07, 2013, which showed significant improvement and vascular congestion and left basilar infiltrate.  Minimal residual changes in the left base are noted.  CARDIAC PROCEDURE: 1. 2D echo performed on December 02, 2013, which showed asymmetrical     septal hypertrophy.  Mildly dilated left ventricle.  Wall thickness     was increased in a pattern of severe LVH.  Estimated EF was 30%.     Diffuse hypokinesis was noted.  Aortic valve with mild     regurgitation.  Mitral valve with calcified anulus and mild     regurgitation.  Left atrium was severely dilated.  HISTORY OF PRESENT ILLNESS:  [name] is a very frail 79 year old woman with a history of coronary artery disease, peripheral vascular disease, hypertension, COPD and history of ischemic systolic  heart failure, who called the office on the day of admission, reporting she had been ill for a week, but was too weak to come to the office. Because of her reliability as a patient, she was directly admitted to the hospital.  The patient reported at admission she had increased urinary frequency, dysuria, poor p.o. intake.  She denies frank shortness of breath, but had been less active than usual.  She reports that she had been increasingly weak and had fallen once on her way to the bathroom with no injury.  She reports her bowels were regular.  She denied any cough, any documented fever.    She was initially admitted in observation status with a concern for decompensated CHF and probable urinary tract infection.  Of note, she was recently treated for pneumonia and recurrent pneumonkia was a possibility.  Please see the H and P for past medical history, family history, social history, and physical exam.  HOSPITAL COURSE:   1. Cardiac.  The patient was admitted to a regular floor. Several hours after admission the patient decompensated with very high systolic hypertension, significant respiratory distress and hypoxemia. She was transferred to the step-down unit.  The patient was also found to be in very rapid atrial fibrillation.  She was  treated medically. There was some question whether the patient had an ischemic event, resulting in flash pulmonary edema.  The patient was vigorously diuresed and did well.  The patient was able to be transferred to a regular floor.  She remained stable.  Chest x-rays were followed and she had resolution of her significant pulmonary edema/congestive heart failure.  The patient had a 2D echo with results as noted.  At the time of discharge dictation, the patient is stable from a cardiac perspective with no evidence of pulmonary edema or Decompensation.  2. ID/GU.  The patient did have urinary tract symptoms and had a specimen     sent at admission.   This did grew out greater than 100,000 colonies     of E. coli that was ESBL.  The patient was started on Primaxin, to     which her E. Coli was sensitive.  The patient had a followup     urinalysis on the 18th, which showed significant clearing of her     UTI.  The patient remained afebrile.  She had normal urinary     function.  Foley catheter was discontinued and she was able to     urinate on her own.  The patient at time of discharge will receive     her 5th dose of Primaxin for completion of her treatment.  3. Pulmonary/ID, the patient did have an infiltrate develope on chest     x-ray, and she was thought to have a hospital-acquired pneumonia.     Primaxin offered excellent coverage and oral Septra was added for     possible MRSA.  The patient did well. She had no respiratory     distress.  She had no significant sputum production.  Chest x-ray,     as above, did clear with improvement in her infiltrate.  At the     time of discharge dictation, the patient has no respiratory     distress, with good air movement.  Plan is to continue Septra for     an additional 5 days.  No followup x-ray is required since, she is     doing well at this time.  4. Hyponatremia. The patient had a low sodium, most likely due to her     acute medical status.  She was given low-flow IV saline.  On this     regimen, the patient's sodium did correct and at time of discharge     dictation, her most recent chemistry panel revealed a sodium of     136.  This problem has considered resolved.  5. Deconditioning, the patient is 79 years old and frail.  She was     seen by PT and OT during this hospitalization.  It was felt that     she would benefit from short-term skilled care in regard to     recovering her strength and independence.  The patient is willing to     participate in such a program. At this time She is ready for     transfer to a skilled care facility when bed is available.  DISCHARGE PHYSICAL  EXAMINATION:  VITAL SIGNS:  Temperature was 98, blood pressure 129/68, heart rate 77, respirations were 22, oxygen saturations 95% on room air. GENERAL APPEARANCE:  This is actually a well-preserved 79 year old woman who is brought in a good mood, in no distress.  HEENT:  Conjunctiva and sclerae were clear.  Pupils equal, round, and reactive.  Oropharynx with  normal mucous membranes. NECK:  Supple.  There is no thyromegaly. NODES:  No adenopathy was noted in the cervical or supraclavicular regions. CHEST:  The patient has no chest deformity and no CVA tenderness. PULMONARY:  The patient is moving air well.  There is no increased work of breathing.  She has no rales, wheezes or rhonchi.  CARDIOVASCULAR: 2+ radial pulses.  She has a quiet precordium.  Her heart sounds are normal without significant murmur.  Her heart rate is irregularly irregular, but rate controlled. ABDOMEN:  Soft.  No guarding or rebound.  No tenderness is noted. GENITALIA:  Deferred. EXTREMITIES:  The patient has significant podiatric problems with overlapping toes.  For this, she does put rolled 4x4s between her toes and then wrapped with a dressing in order prevent callus formation. This simple treatment should be continued in the skilled care facility. No other abnormalities were noted. DERM:  The patient no signs of skin breakdown or ulcerations. NEURO:  The patient is awake, alert.  She is oriented to person, place, time and context.  Her speech is clear.  Cognition is normal.  Cranial nerves II through XII were unremarkable.  The patient has good motor strength.  Able to sit up without assistance.  Moves all extremities to command.  She was not stood or ambulated.  FINAL LABORATORY:  On December 19, the patient had a creatinine of 1.28 with a GFR of 40.  Chemistries from December 17 with a sodium of 136, potassium 3.5, chloride 99, CO2 of 29, BUN of 43, glucose was 181. Final CBC from December 17 with a white  count of 9500, hemoglobin 14.1 g, platelet count 196,000.  Final urinalysis from December 17 showed the urine to be yellow, clear with a specific gravity of 1.017.  She had a small trace amount of leukocyte esterase, 0-2 wbcs, rare bacteria. Final microbiology, MRSA nasal swab was negative on December 12.  Urine culture was positive for E. coli that was ESBL and sensitive to Zosyn and imipenem.  Blood cultures were negative with no growth after 5 days.  DISCHARGE MEDICATIONS:   1.Aspirin 325 mg daily,  2.atenolol 100 mg p.o.daily,  3. Pletal 100 mg b.i.d.,  4. Plavix 75 mg daily, 5. diltiazem CD 240 one capsule daily,  6. feeding supplement with Ensure or equivalent 2 cans daily between meals,  7. fluticasone nasal spray 2 sprays each nostril daily p.r.n.,  8. furosemide 40 mg daily,  9. Protonix 40 mg q.a.m.,  10.prednisone 20 mg daily for 3 days and 10 mg daily for 6 days and stop,  11.sulfamethoxazole-trimethoprim 400-80 one tablet every 12 hours for an additional 5 days, 12.Kenalog 0.1% cream apply to skin rash as needed.  DISPOSITION:  The patient will be transferred to a skilled care facility.  She may follow all facility protocols.  She may have leave of absence with family.  She is a DNR.  The patient should see her primary care physician, Dr. Debby Bud, within 7 days of discharge from skilled care facility.  The patient's condition at time of discharge dictation is markedly improved and stable, although she is at risk for problems given her advanced age, multiple comorbidities.     Rosalyn Gess Norins, MD     MEN/MEDQ  D:  12/08/2013  T:  12/08/2013  Job:  [id number]

## 2013-12-11 ENCOUNTER — Non-Acute Institutional Stay (SKILLED_NURSING_FACILITY): Payer: Medicare PPO | Admitting: Internal Medicine

## 2013-12-11 ENCOUNTER — Encounter: Payer: Self-pay | Admitting: Internal Medicine

## 2013-12-11 DIAGNOSIS — I10 Essential (primary) hypertension: Secondary | ICD-10-CM

## 2013-12-11 DIAGNOSIS — I509 Heart failure, unspecified: Secondary | ICD-10-CM

## 2013-12-11 DIAGNOSIS — I2510 Atherosclerotic heart disease of native coronary artery without angina pectoris: Secondary | ICD-10-CM

## 2013-12-11 DIAGNOSIS — K219 Gastro-esophageal reflux disease without esophagitis: Secondary | ICD-10-CM

## 2013-12-11 DIAGNOSIS — L84 Corns and callosities: Secondary | ICD-10-CM | POA: Insufficient documentation

## 2013-12-11 DIAGNOSIS — M79609 Pain in unspecified limb: Secondary | ICD-10-CM

## 2013-12-11 DIAGNOSIS — R5381 Other malaise: Secondary | ICD-10-CM | POA: Insufficient documentation

## 2013-12-11 DIAGNOSIS — B353 Tinea pedis: Secondary | ICD-10-CM

## 2013-12-11 DIAGNOSIS — I739 Peripheral vascular disease, unspecified: Secondary | ICD-10-CM

## 2013-12-11 DIAGNOSIS — N390 Urinary tract infection, site not specified: Secondary | ICD-10-CM

## 2013-12-11 DIAGNOSIS — M79674 Pain in right toe(s): Secondary | ICD-10-CM | POA: Insufficient documentation

## 2013-12-11 DIAGNOSIS — I4891 Unspecified atrial fibrillation: Secondary | ICD-10-CM

## 2013-12-11 NOTE — Progress Notes (Signed)
Patient ID: [name], female   DOB: 1915-09-21, 79 y.o.   MRN: [id number]    [facility]    PCP: [name], MD  Code Status: dnr  Allergies  Allergen Reactions  . Codeine Phosphate     REACTION: unspecified  . Doxycycline Other (See Comments)    unknown  . Erythromycin Other (See Comments)    unknown    Chief Complaint: new admit  HPI:  79 y/o elderly female patient with history of PVD, CAD, CHF, COPD is here for STR with deconditioning after hospital admission from 12/01/13- 12/08/13 with chf exacerbation, pneumonia and ESBL E.coli UTI. She was in afib and her rate was medically controlled. She also had pulmonary edema in setting of chf exacerbation and responded well to diuresis. She was seen in her room today. She complaints of weakness and pain in her right great toe. She provides history of heel ulcer in the past. She denies any SOB or chest pain this visit. Her appetite is improving. No other complaints  Review of Systems  Constitutional: Negative for fever, chills, weight loss, malaise/fatigue and diaphoresis.  HENT: Negative for congestion, hearing loss and sore throat.   Eyes: Negative for blurred vision, double vision and discharge.  Respiratory: Negative for cough, sputum production, shortness of breath and wheezing.   Cardiovascular: Negative for chest pain, palpitations, orthopnea and leg swelling.  Gastrointestinal: Negative for heartburn, nausea, vomiting, abdominal pain, diarrhea. Has not had a bowel movement for 3 days. Passing flatus. Genitourinary: Negative for dysuria, urgency, frequency and flank pain.  Musculoskeletal: Negative for back pain, falls, joint pain and myalgias.  Skin: Negative for itching and rash.  Neurological: Positive for weakness. Negative for dizziness, tingling, focal weakness and headaches.  Psychiatric/Behavioral: Negative for depression and memory loss. The patient is not nervous/anxious.    Past Medical History    Diagnosis Date  . CAD (coronary artery disease)   . Hyperlipidemia   . Osteoporosis   . PVD (peripheral vascular disease)   . HTN (hypertension)   . Diverticulosis of colon   . GERD (gastroesophageal reflux disease)   . Hip fracture, right   . Decubitus ulcer     Right heel  . COPD (chronic obstructive pulmonary disease)   . Anxiety   . Anemia   . Esophageal stricture   . CHF (congestive heart failure)   . Atrial fibrillation   . Fibromuscular dysplasia     Renal artery  . Myocardial infarction    Past Surgical History  Procedure Laterality Date  . Ptca  04/2003  . Cataract extraction      Bilateral  . Orif acetabular fracture  2009   Social History:   reports that she has never smoked. She has never used smokeless tobacco. She reports that she does not drink alcohol or use illicit drugs.  History reviewed. No pertinent family history.  Medications: Patient's Medications  New Prescriptions   No medications on file  Previous Medications   ASPIRIN 325 MG TABLET    Take 325 mg by mouth daily.     ATENOLOL (TENORMIN) 100 MG TABLET    TAKE 1 TABLET BY MOUTH DAILY   CILOSTAZOL (PLETAL) 100 MG TABLET    Take 100 mg by mouth 2 (two) times daily.   CLOPIDOGREL (PLAVIX) 75 MG TABLET    TAKE 1 TABLET BY MOUTH EVERY DAY   DILTIAZEM (CARDIZEM CD) 240 MG 24 HR CAPSULE    TAKE ONE CAPSULE BY MOUTH EVERY  DAY   FEEDING SUPPLEMENT, ENSURE COMPLETE, (ENSURE COMPLETE) LIQD    Take 237 mLs by mouth 2 (two) times daily between meals.   FLUTICASONE (FLONASE) 50 MCG/ACT NASAL SPRAY    Place 2 sprays into the nose daily as needed for rhinitis or allergies.   FUROSEMIDE (LASIX) 40 MG TABLET    TAKE 1 TABLET BY MOUTH EVERY DAY   PANTOPRAZOLE (PROTONIX) 40 MG TABLET    Take 1 tablet (40 mg total) by mouth daily.   PREDNISONE (DELTASONE) 20 MG TABLET    Take 1 tablet (20 mg total) by mouth daily. For 3 days, and then 1/2 tab (10mg ) x 6 days and stop   SULFAMETHOXAZOLE-TRIMETHOPRIM  (BACTRIM,SEPTRA) 400-80 MG PER TABLET    Take 1 tablet by mouth every 12 (twelve) hours.   TRIAMCINOLONE CREAM (KENALOG) 0.1 %    Apply 1 application topically 2 (two) times daily as needed (skin irritation).  Modified Medications   No medications on file  Discontinued Medications   No medications on file     Physical Exam: Filed Vitals:   12/11/13 1048  BP: 109/45  Pulse: 60  Temp: 97.2 F (36.2 C)  Resp: 18  SpO2: 96%   General- elderly female in no acute distress Head- atraumatic, normocephalic Eyes- PERRLA, EOMI, no pallor, no icterus, no discharge Neck- no lymphadenopathy, no thyromegaly, no jugular vein distension, no carotid bruit Chest- no chest wall deformities, no chest wall tenderness Cardiovascular- irregular heart rate, no murmurs/ rubs/ gallops Respiratory- bilateral clear to auscultation, no wheeze, no rhonchi, no crackles Abdomen- bowel sounds present, soft, non tender, no organomegaly Musculoskeletal- able to move all 4 extremities, no spinal and paraspinal tenderness, generalized weakness present Neurological- no focal deficit Psychiatry- alert and oriented to person, place and time, normal mood and affect Skin- has macerated skin on her feet and between toes, dressing from heels and ankle removed, decreased dorsalis pedis, no open sores/ ulcers on heel or ankle. Has a scabbed area on right great toe with erythema and tenderness around it   Labs reviewed: Basic Metabolic Panel:  Recent Labs  96/04/54 0347 12/05/13 0508 12/06/13 0829 12/08/13 0500  NA 131* 134* 136  --   K 4.1 3.6 3.5  --   CL 91* 96 99  --   CO2 26 28 29   --   GLUCOSE 160* 144* 181*  --   BUN 58* 58* 43*  --   CREATININE 1.67* 1.41* 1.19* 1.28*  CALCIUM 9.4 8.9 8.7  --    Liver Function Tests:  Recent Labs  06/29/13 1155 07/03/13 1640 12/01/13 1759  AST 29  29 25  47*  ALT 40*  40* 26 79*  ALKPHOS 62  62 61 85  BILITOT 1.3*  1.3* 0.4 0.7  PROT 7.0  7.0 6.9 7.0   ALBUMIN 3.5  3.5 3.2* 3.5   CBC:  Recent Labs  12/01/13 1759  12/04/13 0347 12/05/13 0508 12/06/13 0520  WBC 9.3  < > 18.4* 12.8* 9.5  NEUTROABS 7.2  --  17.4* 11.2*  --   HGB 14.5  < > 14.1 13.6 14.1  HCT 43.8  < > 41.1 38.0 41.5  MCV 86.7  < > 83.4 84.4 84.9  PLT 235  < > 221 215 196  < > = values in this interval not displayed. Cardiac Enzymes:  Recent Labs  07/03/13 1640  TROPONINI <0.30    Assessment/Plan  1. Physical deconditioning To work with PT and OT for gait training and  strengthening. Fall precautions. Will need pressure ulcer prophylaxis, gel overlay mattress ordered. Monitor po intake- has improved as per patient  2. PERIPHERAL VASCULAR DISEASE Continue cilostazol and skin care  3. HYPERTENSION bp well controlled. Continue her atenolol current dose  4. CONGESTIVE HEART FAILURE Monitor weight. Currently asymptomatic. No edema and lungs clear on exam. Continue atenolol, lasix and check bmp  5. Atrial fibrillation Rate currently controlled. Continue atenolol and cardizem for rate control and ASA for stroke prophylaxis  6. GERD Stable, continue protonix  7. Pain of right great toe Has PVD and ? Ulcer with scab formation on right great toe. Will provide podiatry referral for possible debridement of the ulcer. Is on bactrim at present for her UTI and this should provide staph coverage. Monitor for any drainage and signs of infection. Check cbc with diff  8. Callus of heel Will provide foam dressing to help reduce pressure on the heels until seen by podiatry  9. CORONARY ARTERY DISEASE Chest pain free. Continue asa, plavix and b blocker. Not on statin or ACEI  10. UTI (urinary tract infection) Complete course of bactrim, continue until 12/15/13. Currently asymptomatic. Encourage hydration  11. Tinea pedis Will start her on clotrimazole cream 1% bid for 2 week in her feet and interdigital space and reassess. To keep feet dry   Family/ staff  Communication: reviewed care plan with patient and nursing supervisor   Goals of care: return home after STR   Labs/tests ordered: cbc, cmp

## 2013-12-20 ENCOUNTER — Non-Acute Institutional Stay (SKILLED_NURSING_FACILITY): Payer: Medicare PPO | Admitting: Adult Health

## 2013-12-20 ENCOUNTER — Telehealth: Payer: Self-pay | Admitting: *Deleted

## 2013-12-20 DIAGNOSIS — I10 Essential (primary) hypertension: Secondary | ICD-10-CM

## 2013-12-20 DIAGNOSIS — I509 Heart failure, unspecified: Secondary | ICD-10-CM

## 2013-12-20 DIAGNOSIS — R5381 Other malaise: Secondary | ICD-10-CM

## 2013-12-20 NOTE — Telephone Encounter (Signed)
A user error has taken place.

## 2013-12-21 ENCOUNTER — Encounter: Payer: Self-pay | Admitting: Adult Health

## 2013-12-21 NOTE — Progress Notes (Signed)
Patient ID: [name], female   DOB: [birth year], 80 y.o.   MRN: [id number]     [facility]  Allergies  Allergen Reactions  . Codeine Phosphate     REACTION: unspecified  . Doxycycline Other (See Comments)    unknown  . Erythromycin Other (See Comments)    unknown     Chief Complaint  Patient presents with  . Discharge Note    HPI: She is being discharged to home with home health for pt/ot/nursing. She will not need any dme. She will need prescriptions to be written. She had been hospitalized for chf and pneumonia. She was admitted to this facility for short term rehab and ready to complete her therapy on a home health basis. Nursing staff reports that she is having right hip pain. She tells me that she has had a right hip fracture about 3 years ago and was told not to strain her hip. She further tells me that the therapy she has received here has made her work her hip which caused her pain. She states that once she gets home and is able to rest her hip she will feel better.   Past Medical History  Diagnosis Date  . CAD (coronary artery disease)   . Hyperlipidemia   . Osteoporosis   . PVD (peripheral vascular disease)   . HTN (hypertension)   . Diverticulosis of colon   . GERD (gastroesophageal reflux disease)   . Hip fracture, right   . Decubitus ulcer     Right heel  . COPD (chronic obstructive pulmonary disease)   . Anxiety   . Anemia   . Esophageal stricture   . CHF (congestive heart failure)   . Atrial fibrillation   . Fibromuscular dysplasia     Renal artery  . Myocardial infarction     Past Surgical History  Procedure Laterality Date  . Ptca  04/2003  . Cataract extraction      Bilateral  . Orif acetabular fracture  2009    VITAL SIGNS BP 132/70  Pulse 70  Ht 5\' 3"  (1.6 m)  Wt 136 lb 3.2 oz (61.78 kg)  BMI 24.13 kg/m2   Patient's Medications  New Prescriptions   No medications on file  Previous Medications   ASPIRIN 325 MG TABLET    Take  325 mg by mouth daily.     CILOSTAZOL (PLETAL) 100 MG TABLET    Take 100 mg by mouth 2 (two) times daily.   CLOPIDOGREL (PLAVIX) 75 MG TABLET    TAKE 1 TABLET BY MOUTH EVERY DAY   DILTIAZEM (CARDIZEM CD) 240 MG 24 HR CAPSULE    TAKE ONE CAPSULE BY MOUTH EVERY DAY   FEEDING SUPPLEMENT, ENSURE COMPLETE, (ENSURE COMPLETE) LIQD    Take 237 mLs by mouth 2 (two) times daily between meals.   FLUTICASONE (FLONASE) 50 MCG/ACT NASAL SPRAY    Place 2 sprays into the nose daily as needed for rhinitis or allergies.   FUROSEMIDE (LASIX) 40 MG TABLET    TAKE 1 TABLET BY MOUTH EVERY DAY   PANTOPRAZOLE (PROTONIX) 40 MG TABLET    Take 1 tablet (40 mg total) by mouth daily.   POLYETHYLENE GLYCOL (MIRALAX / GLYCOLAX) PACKET    Take 17 g by mouth daily as needed.  Modified Medications   No medications on file  Discontinued Medications   ATENOLOL (TENORMIN) 100 MG TABLET    TAKE 1 TABLET BY MOUTH DAILY   PREDNISONE (DELTASONE) 20 MG TABLET  Take 1 tablet (20 mg total) by mouth daily. For 3 days, and then 1/2 tab (10mg ) x 6 days and stop   SULFAMETHOXAZOLE-TRIMETHOPRIM (BACTRIM,SEPTRA) 400-80 MG PER TABLET    Take 1 tablet by mouth every 12 (twelve) hours.   TRIAMCINOLONE CREAM (KENALOG) 0.1 %    Apply 1 application topically 2 (two) times daily as needed (skin irritation).    SIGNIFICANT DIAGNOSTIC EXAMS     Component Value Date/Time   ALBUMIN 3.5 12/01/2013 1759   AST 47* 12/01/2013 1759   ALT 79* 12/01/2013 1759   ALKPHOS 85 12/01/2013 1759   BILITOT 0.7 12/01/2013 1759       Component Value Date/Time   BUN 43* 12/06/2013 0829   GLUCOSE 181* 12/06/2013 0829   CREATININE 1.28* 12/08/2013 0500   K 3.5 12/06/2013 0829   NA 136 12/06/2013 0829   TSH 8.942* 12/01/2013 1759       Component Value Date/Time   WBC 9.5 12/06/2013 0520   RBC 4.89 12/06/2013 0520   HGB 14.1 12/06/2013 0520   HCT 41.5 12/06/2013 0520   PLT 196 12/06/2013 0520   MCV 84.9 12/06/2013 0520    Review of Systems    Constitutional: Negative for malaise/fatigue.  Eyes: Negative for blurred vision.  Respiratory: Negative for cough and shortness of breath.   Cardiovascular: Negative for chest pain, palpitations and leg swelling.  Gastrointestinal: Negative for heartburn, abdominal pain and constipation.  Musculoskeletal: Positive for joint pain. Negative for myalgias.  Skin: Negative.   Neurological: Negative for dizziness, weakness and headaches.  Psychiatric/Behavioral: Negative for depression. The patient is not nervous/anxious.     Physical Exam  Constitutional: She is oriented to person, place, and time. She appears well-developed and well-nourished. No distress.  Neck: Neck supple. No JVD present.  Cardiovascular: Normal rate, regular rhythm and intact distal pulses.   Respiratory: Effort normal and breath sounds normal. No respiratory distress.  GI: Soft. Bowel sounds are normal. She exhibits no distension. There is no tenderness.  Musculoskeletal: Normal range of motion. She exhibits no edema.  Neurological: She is alert and oriented to person, place, and time.  Skin: Skin is warm and dry. She is not diaphoretic.     ASSESSMENT/ PLAN:  Will discharge her to home with home health for pt/ot/nursing. She will not need dme; her prescriptions have been written   Time spent with patient 40 minutes.

## 2013-12-25 DIAGNOSIS — I4891 Unspecified atrial fibrillation: Secondary | ICD-10-CM

## 2013-12-25 DIAGNOSIS — I2510 Atherosclerotic heart disease of native coronary artery without angina pectoris: Secondary | ICD-10-CM

## 2013-12-25 DIAGNOSIS — I10 Essential (primary) hypertension: Secondary | ICD-10-CM

## 2013-12-25 DIAGNOSIS — I509 Heart failure, unspecified: Secondary | ICD-10-CM

## 2013-12-26 ENCOUNTER — Telehealth: Payer: Self-pay | Admitting: Internal Medicine

## 2013-12-26 NOTE — Telephone Encounter (Signed)
Pt came home Saturday from rehab.  She feels nauseous all the time.  She just lays in the bed all day.  Weak.  Head hurts.

## 2013-12-26 NOTE — Telephone Encounter (Signed)
Her grandson has questions about the RX's she was given when she went home.

## 2013-12-26 NOTE — Telephone Encounter (Signed)
Called - 25 min on the phone. Reviewed her discharge meds from [facility] - all per hosp d/c summary except tenormin was stopped.  Ruth NajjarLarry says she is feeling bad. He cannot tell if she is short of breath.  Advised either OV soon or if she has breathing trouble ED. She is old, frail and at high risk for recurrent HF

## 2013-12-27 ENCOUNTER — Emergency Department (HOSPITAL_COMMUNITY): Payer: Medicare PPO

## 2013-12-27 ENCOUNTER — Encounter (HOSPITAL_COMMUNITY): Payer: Self-pay | Admitting: Emergency Medicine

## 2013-12-27 ENCOUNTER — Inpatient Hospital Stay (HOSPITAL_COMMUNITY)
Admission: EM | Admit: 2013-12-27 | Discharge: 2014-01-02 | DRG: 291 | Disposition: A | Discharge status: Skilled Nursing Facility | Payer: Medicare PPO | Attending: Internal Medicine | Admitting: Internal Medicine

## 2013-12-27 DIAGNOSIS — F411 Generalized anxiety disorder: Secondary | ICD-10-CM | POA: Diagnosis present

## 2013-12-27 DIAGNOSIS — Z9861 Coronary angioplasty status: Secondary | ICD-10-CM

## 2013-12-27 DIAGNOSIS — M810 Age-related osteoporosis without current pathological fracture: Secondary | ICD-10-CM | POA: Diagnosis present

## 2013-12-27 DIAGNOSIS — E46 Unspecified protein-calorie malnutrition: Secondary | ICD-10-CM | POA: Diagnosis present

## 2013-12-27 DIAGNOSIS — K219 Gastro-esophageal reflux disease without esophagitis: Secondary | ICD-10-CM | POA: Diagnosis present

## 2013-12-27 DIAGNOSIS — S065XAA Traumatic subdural hemorrhage with loss of consciousness status unknown, initial encounter: Secondary | ICD-10-CM

## 2013-12-27 DIAGNOSIS — J9602 Acute respiratory failure with hypercapnia: Secondary | ICD-10-CM | POA: Diagnosis present

## 2013-12-27 DIAGNOSIS — Z7982 Long term (current) use of aspirin: Secondary | ICD-10-CM

## 2013-12-27 DIAGNOSIS — S72009A Fracture of unspecified part of neck of unspecified femur, initial encounter for closed fracture: Secondary | ICD-10-CM

## 2013-12-27 DIAGNOSIS — J309 Allergic rhinitis, unspecified: Secondary | ICD-10-CM

## 2013-12-27 DIAGNOSIS — L84 Corns and callosities: Secondary | ICD-10-CM

## 2013-12-27 DIAGNOSIS — Z7901 Long term (current) use of anticoagulants: Secondary | ICD-10-CM

## 2013-12-27 DIAGNOSIS — Z66 Do not resuscitate: Secondary | ICD-10-CM | POA: Diagnosis present

## 2013-12-27 DIAGNOSIS — I739 Peripheral vascular disease, unspecified: Secondary | ICD-10-CM | POA: Diagnosis present

## 2013-12-27 DIAGNOSIS — S065X9A Traumatic subdural hemorrhage with loss of consciousness of unspecified duration, initial encounter: Secondary | ICD-10-CM

## 2013-12-27 DIAGNOSIS — J441 Chronic obstructive pulmonary disease with (acute) exacerbation: Secondary | ICD-10-CM | POA: Diagnosis present

## 2013-12-27 DIAGNOSIS — I5023 Acute on chronic systolic (congestive) heart failure: Principal | ICD-10-CM | POA: Diagnosis present

## 2013-12-27 DIAGNOSIS — E785 Hyperlipidemia, unspecified: Secondary | ICD-10-CM

## 2013-12-27 DIAGNOSIS — J9690 Respiratory failure, unspecified, unspecified whether with hypoxia or hypercapnia: Secondary | ICD-10-CM

## 2013-12-27 DIAGNOSIS — Z9849 Cataract extraction status, unspecified eye: Secondary | ICD-10-CM

## 2013-12-27 DIAGNOSIS — M549 Dorsalgia, unspecified: Secondary | ICD-10-CM

## 2013-12-27 DIAGNOSIS — D649 Anemia, unspecified: Secondary | ICD-10-CM

## 2013-12-27 DIAGNOSIS — J4489 Other specified chronic obstructive pulmonary disease: Secondary | ICD-10-CM | POA: Diagnosis present

## 2013-12-27 DIAGNOSIS — M79609 Pain in unspecified limb: Secondary | ICD-10-CM

## 2013-12-27 DIAGNOSIS — Z885 Allergy status to narcotic agent status: Secondary | ICD-10-CM

## 2013-12-27 DIAGNOSIS — M79674 Pain in right toe(s): Secondary | ICD-10-CM

## 2013-12-27 DIAGNOSIS — I509 Heart failure, unspecified: Secondary | ICD-10-CM | POA: Diagnosis present

## 2013-12-27 DIAGNOSIS — I6200 Nontraumatic subdural hemorrhage, unspecified: Secondary | ICD-10-CM | POA: Diagnosis present

## 2013-12-27 DIAGNOSIS — L209 Atopic dermatitis, unspecified: Secondary | ICD-10-CM

## 2013-12-27 DIAGNOSIS — I4891 Unspecified atrial fibrillation: Secondary | ICD-10-CM | POA: Diagnosis present

## 2013-12-27 DIAGNOSIS — R42 Dizziness and giddiness: Secondary | ICD-10-CM

## 2013-12-27 DIAGNOSIS — I2510 Atherosclerotic heart disease of native coronary artery without angina pectoris: Secondary | ICD-10-CM | POA: Diagnosis present

## 2013-12-27 DIAGNOSIS — N390 Urinary tract infection, site not specified: Secondary | ICD-10-CM | POA: Diagnosis present

## 2013-12-27 DIAGNOSIS — G4700 Insomnia, unspecified: Secondary | ICD-10-CM

## 2013-12-27 DIAGNOSIS — E872 Acidosis, unspecified: Secondary | ICD-10-CM | POA: Diagnosis present

## 2013-12-27 DIAGNOSIS — K5730 Diverticulosis of large intestine without perforation or abscess without bleeding: Secondary | ICD-10-CM

## 2013-12-27 DIAGNOSIS — I10 Essential (primary) hypertension: Secondary | ICD-10-CM | POA: Diagnosis present

## 2013-12-27 DIAGNOSIS — J9600 Acute respiratory failure, unspecified whether with hypoxia or hypercapnia: Secondary | ICD-10-CM | POA: Diagnosis present

## 2013-12-27 DIAGNOSIS — R5381 Other malaise: Secondary | ICD-10-CM

## 2013-12-27 DIAGNOSIS — J449 Chronic obstructive pulmonary disease, unspecified: Secondary | ICD-10-CM | POA: Diagnosis present

## 2013-12-27 DIAGNOSIS — I252 Old myocardial infarction: Secondary | ICD-10-CM

## 2013-12-27 HISTORY — DX: Shortness of breath: R06.02

## 2013-12-27 LAB — CBC WITH DIFFERENTIAL/PLATELET
BASOS PCT: 0 % (ref 0–1)
Basophils Absolute: 0 10*3/uL (ref 0.0–0.1)
EOS ABS: 0.2 10*3/uL (ref 0.0–0.7)
Eosinophils Relative: 3 % (ref 0–5)
HEMATOCRIT: 43.3 % (ref 36.0–46.0)
HEMOGLOBIN: 14.6 g/dL (ref 12.0–15.0)
Lymphocytes Relative: 31 % (ref 12–46)
Lymphs Abs: 2.1 10*3/uL (ref 0.7–4.0)
MCH: 29.3 pg (ref 26.0–34.0)
MCHC: 33.7 g/dL (ref 30.0–36.0)
MCV: 86.8 fL (ref 78.0–100.0)
MONO ABS: 0.5 10*3/uL (ref 0.1–1.0)
Monocytes Relative: 7 % (ref 3–12)
Neutro Abs: 4 10*3/uL (ref 1.7–7.7)
Neutrophils Relative %: 58 % (ref 43–77)
Platelets: 360 10*3/uL (ref 150–400)
RBC: 4.99 MIL/uL (ref 3.87–5.11)
RDW: 14.5 % (ref 11.5–15.5)
WBC: 6.9 10*3/uL (ref 4.0–10.5)

## 2013-12-27 LAB — CG4 I-STAT (LACTIC ACID): Lactic Acid, Venous: 2.18 mmol/L (ref 0.5–2.2)

## 2013-12-27 MED ORDER — LORAZEPAM 2 MG/ML IJ SOLN: 0.5000 mg | Freq: Once | INTRAMUSCULAR | Status: DC

## 2013-12-27 MED ORDER — FENTANYL CITRATE 0.05 MG/ML IJ SOLN
50.0000 ug | Freq: Once | INTRAMUSCULAR | Status: AC
  Administered 2013-12-27: 50 ug via INTRAVENOUS
  Filled 2013-12-27: qty 2

## 2013-12-27 MED ORDER — FUROSEMIDE 10 MG/ML IJ SOLN
40.0000 mg | Freq: Once | INTRAMUSCULAR | Status: AC
  Administered 2013-12-28: 40 mg via INTRAVENOUS
  Filled 2013-12-27: qty 4

## 2013-12-27 MED ORDER — LORAZEPAM 2 MG/ML IJ SOLN
0.5000 mg | Freq: Once | INTRAMUSCULAR | Status: AC
  Administered 2013-12-28: 0.5 mg via INTRAVENOUS
  Filled 2013-12-27: qty 1

## 2013-12-27 MED ORDER — ONDANSETRON HCL 4 MG/2ML IJ SOLN
4.0000 mg | Freq: Once | INTRAMUSCULAR | Status: AC
  Administered 2013-12-27: 4 mg via INTRAVENOUS
  Filled 2013-12-27: qty 2

## 2013-12-27 NOTE — ED Notes (Signed)
Family at bedside. 

## 2013-12-27 NOTE — ED Notes (Signed)
MD at bedside. 

## 2013-12-27 NOTE — ED Notes (Signed)
Per EMS: pt coming from home with c/o abdominal pain and generalized pain. Family reports similar incidents. 12 lead a-fib with LVH, pt denies chest pain. No meds given. Pt denies n/v. Pt reports tenderness of left and right upper quadrants. Pt is a DNR. Pt is A&O to her norm, skin warm and dry, respirations equal and unlabored.

## 2013-12-27 NOTE — ED Provider Notes (Signed)
CSN: [id number]     Arrival date & time    History   First MD Initiated Contact with Patient 12/27/13 2329     Chief Complaint  Patient presents with  . Abdominal Pain  . Headache   (Consider location/radiation/quality/duration/timing/severity/associated sxs/prior Treatment) HPI Comments: Patient is a poor historian. She comes from home with a four-day history of upper abdominal pain and headache. She is asking for something for pain. She denies any vomiting though her grandson states that she been spitting up. She denies any fever, chills, change in bowel habits. Her headache is diffuse and gradual in onset. No focal weakness, numbness or tingling. Denies any falls. She denies any chest pain or shortness of breath. She's anxious and tachypnea.  She is DO NOT RESUSCITATE per records which is confirmed by family.  The history is provided by the patient and a relative. The history is limited by the condition of the patient.    Past Medical History  Diagnosis Date  . CAD (coronary artery disease)   . Hyperlipidemia   . Osteoporosis   . PVD (peripheral vascular disease)   . HTN (hypertension)   . Diverticulosis of colon   . GERD (gastroesophageal reflux disease)   . Hip fracture, right   . Decubitus ulcer     Right heel  . COPD (chronic obstructive pulmonary disease)   . Anxiety   . Anemia   . Esophageal stricture   . CHF (congestive heart failure)   . Atrial fibrillation   . Fibromuscular dysplasia     Renal artery  . Myocardial infarction    Past Surgical History  Procedure Laterality Date  . Ptca  04/2003  . Cataract extraction      Bilateral  . Orif acetabular fracture  2009   History reviewed. No pertinent family history. History  Substance Use Topics  . Smoking status: Never Smoker   . Smokeless tobacco: Never Used  . Alcohol Use: No   OB History   Grav Para Term Preterm Abortions TAB SAB Ect Mult Living                 Review of Systems  Unable to perform  ROS: Acuity of condition  Gastrointestinal: Positive for abdominal pain.  Neurological: Positive for headaches.    Allergies  Codeine phosphate; Doxycycline; and Erythromycin  Home Medications   Current Outpatient Rx  Name  Route  Sig  Dispense  Refill  . aspirin 325 MG tablet   Oral   Take 325 mg by mouth daily.           . cilostazol (PLETAL) 100 MG tablet   Oral   Take 100 mg by mouth 2 (two) times daily.         . clopidogrel (PLAVIX) 75 MG tablet   Oral   Take 75 mg by mouth daily.         Marland Kitchen diltiazem (DILACOR XR) 240 MG 24 hr capsule   Oral   Take 240 mg by mouth daily.         . feeding supplement, ENSURE COMPLETE, (ENSURE COMPLETE) LIQD   Oral   Take 237 mLs by mouth 2 (two) times daily between meals.         . fluticasone (FLONASE) 50 MCG/ACT nasal spray   Nasal   Place 2 sprays into the nose daily as needed for rhinitis or allergies.         . furosemide (LASIX) 40 MG tablet  Oral   Take 40 mg by mouth daily.         . pantoprazole (PROTONIX) 40 MG tablet   Oral   Take 40 mg by mouth daily.         . polyethylene glycol (MIRALAX / GLYCOLAX) packet   Oral   Take 17 g by mouth daily as needed.         . triamcinolone cream (KENALOG) 0.1 %   Topical   Apply 1 application topically 2 (two) times daily as needed. For skin irritation         . zinc oxide (BALMEX) 11.3 % CREA cream   Topical   Apply 1 application topically 3 (three) times daily.          BP 123/84  Pulse 113  Temp(Src) 97.7 F (36.5 C) (Oral)  Resp 23  Ht 5\' 3"  (1.6 m)  Wt 136 lb 3.9 oz (61.8 kg)  BMI 24.14 kg/m2  SpO2 100% Physical Exam  Constitutional: She is oriented to person, place, and time. She appears well-developed. She appears distressed.  HENT:  Head: Normocephalic and atraumatic.  Mouth/Throat: Oropharynx is clear and moist. No oropharyngeal exudate.  Eyes: Conjunctivae and EOM are normal. Pupils are equal, round, and reactive to light.   Neck: Normal range of motion. Neck supple.  Cardiovascular: Normal rate and normal heart sounds.   irregular rhythm  Pulmonary/Chest: Effort normal. No respiratory distress. She has rales.  Crackles at bases  Abdominal: Soft. There is tenderness. There is no rebound and no guarding.  Large bruise to lower abdomen that is nontender. Tenderness in the right upper quadrant and epigastrium.  Musculoskeletal: Normal range of motion. She exhibits no edema and no tenderness.  Neurological: She is alert and oriented to person, place, and time. No cranial nerve deficit. Coordination normal.  CN 2-12 intact, no ataxia on finger to nose, no nystagmus, 5/5 strength throughout, no pronator drift,      ED Course  Procedures (including critical care time) Labs Review Labs Reviewed  COMPREHENSIVE METABOLIC PANEL - Abnormal; Notable for the following:    Sodium 134 (*)    Chloride 92 (*)    Glucose, Bld 150 (*)    BUN 31 (*)    Creatinine, Ser 1.20 (*)    Albumin 3.1 (*)    AST 40 (*)    ALT 46 (*)    Alkaline Phosphatase 139 (*)    GFR calc non Af Amer 37 (*)    GFR calc Af Amer 43 (*)    All other components within normal limits  URINALYSIS, ROUTINE W REFLEX MICROSCOPIC - Abnormal; Notable for the following:    APPearance TURBID (*)    Hgb urine dipstick SMALL (*)    Protein, ur 30 (*)    Leukocytes, UA LARGE (*)    All other components within normal limits  PRO B NATRIURETIC PEPTIDE - Abnormal; Notable for the following:    Pro B Natriuretic peptide (BNP) 10512.0 (*)    All other components within normal limits  URINE MICROSCOPIC-ADD ON - Abnormal; Notable for the following:    Squamous Epithelial / LPF MANY (*)    Bacteria, UA MANY (*)    All other components within normal limits  POCT I-STAT 3, BLOOD GAS (G3+) - Abnormal; Notable for the following:    pH, Arterial 7.278 (*)    pCO2 arterial 62.1 (*)    pO2, Arterial 168.0 (*)    Bicarbonate 29.1 (*)  All other components  within normal limits  URINE CULTURE  CBC WITH DIFFERENTIAL  LIPASE, BLOOD  TROPONIN I  PROTIME-INR  TROPONIN I  TROPONIN I  TROPONIN I  CG4 I-STAT (LACTIC ACID)   Imaging Review Ct Abdomen Pelvis Wo Contrast  12/28/2013   CLINICAL DATA:  Abdominal pain and headache.  EXAM: CT ABDOMEN AND PELVIS WITHOUT CONTRAST  TECHNIQUE: Multidetector CT imaging of the abdomen and pelvis was performed following the standard protocol without intravenous contrast.  COMPARISON:  None.  FINDINGS: BODY WALL: Unremarkable.  LOWER CHEST: Cardiomegaly with coronary artery atherosclerosis and mitral annular calcification. There are moderate, layering bilateral pleural effusions and bibasilar atelectasis.  ABDOMEN/PELVIS:  Liver: No focal abnormality.  Biliary: No evidence of biliary obstruction or stone.  Pancreas: Unremarkable.  Spleen: Granulomatous changes.  Adrenals: Unremarkable.  Kidneys and ureters: No hydronephrosis or stone.  Bladder: Unremarkable.  Reproductive: Unremarkable.  Bowel: No bowel obstruction. Extensive distal colonic diverticulosis. Normal appendix.  Retroperitoneum: No mass or adenopathy.  Peritoneum: No free fluid or gas.  Vascular: Extensive aortic and branch vessel atherosclerosis.  OSSEOUS: Located right hip hemiarthroplasty. L1 and L2 compression fractures which are remote. The L2 compression has progressed since 03/22/2012. No acute fracture seen. Grade 1 L5-S1 anterolisthesis with advanced facet osteoarthritis.  IMPRESSION: 1. As permitted by respiratory motion, no acute intra-abdominal findings. 2. Moderate bilateral pleural effusions with atelectasis.   Electronically Signed   By: Tiburcio Pea M.D.   On: 12/28/2013 02:58   Ct Head Wo Contrast  12/28/2013   CLINICAL DATA:  Headache  EXAM: CT HEAD WITHOUT CONTRAST  TECHNIQUE: Contiguous axial images were obtained from the base of the skull through the vertex without intravenous contrast.  COMPARISON:  Prior CT from 02/15/2008  FINDINGS:  Diffuse age-related cerebral atrophy with chronic microvascular ischemic disease is present. Findings are advanced as compared to most recent CT examination from 02/15/2008.  Right parafalcine hyperdensity measuring 1.7 x 1.3 cm is seen along the anterior falx (series 2, image 19). Second focus of hyperdensity seen within the right parafalcine region is located slightly more cephalad and measures 1.6 by is 0.7 cm. Findings are most consistent with acute/ subacute subdural hemorrhage. There is no significant mass effect about these bleeds. No midline shift or hydrocephalus. No other intracranial hemorrhage.  The calvarium is intact. Orbits are normal. Paranasal sinuses and mastoid air cells are clear.  IMPRESSION: 1. Acute/early subacute right parafalcine subdural hemorrhage as above. No significant mass effect, midline shift, or hydrocephalus. 2. Advanced age-related atrophy with chronic microvascular ischemic disease, progressed as compared to 2009. Critical Value/emergent results were called by telephone at the time of interpretation on 12/28/2013 at 2:26 AM to Dr. Glynn Octave , who verbally acknowledged these results.   Electronically Signed   By: Rise Mu M.D.   On: 12/28/2013 02:29   Dg Chest Portable 1 View  12/28/2013   CLINICAL DATA:  Abdominal pain and headache  EXAM: PORTABLE CHEST - 1 VIEW  COMPARISON:  12/07/2013  FINDINGS: Cardiopericardial enlargement which is chronic. Diffuse interstitial coarsening. Kerley B-lines. Bilateral pleural effusions which are layering. No pneumothorax.  IMPRESSION: CHF, including pleural effusions.   Electronically Signed   By: Tiburcio Pea M.D.   On: 12/28/2013 00:59   US Abdomen Limited Ruq  12/28/2013   CLINICAL DATA:  Right upper quadrant pain  EXAM: US ABDOMEN LIMITED - RIGHT UPPER QUADRANT  COMPARISON:  None.  FINDINGS: Gallbladder  Few mid level echoes may represent sludge. No stones.  No wall thickening or sonographic Murphy sign.  Common bile  duct  Diameter: 2 mm  Liver:  No focal lesion identified. Within normal limits in parenchymal echogenicity.  Other:  Moderate right pleural effusion.  IMPRESSION: 1. Negative for cholelithiasis or acute cholecystitis. 2. Right pleural effusion.   Electronically Signed   By: Tiburcio Pea M.D.   On: 12/28/2013 02:13    EKG Interpretation    Date/Time:  Wednesday December 27 2013 23:12:24 EST Ventricular Rate:  104 PR Interval:    QRS Duration: 136 QT Interval:  381 QTC Calculation: 501 R Axis:   116 Text Interpretation:  Atrial fibrillation Left bundle branch block Anterior infarct, acute (LAD) Baseline wander in lead(s) V2 No significant change was found Confirmed by Manus Gunning  MD, Edrie Ehrich ([id number]) on 12/27/2013 11:44:26 PM            MDM   1. CHF (congestive heart failure)   2. Respiratory failure   3. SDH (subdural hematoma)   4. Acute on chronic systolic CHF (congestive heart failure), NYHA class 2   5. Acute respiratory failure with hypercapnia   6. CHF NYHA class II   7. UTI (lower urinary tract infection)    Abdominal pain with headache of 4 days duration. Rate controlled atrial fibrillation on monitor. Tenderness in the right upper quadrant and epigastrium.  Patient was given nebulizers, steroids, IV Lasix.  Her work of breathing became worse throughout her stay in the ED with decreased mental status, increased congestion and wheezing and labored breathing. Her grandson confirms her DO NOT RESUSCITATE status.  After she received 50 mcg of fentanyl, patient's mental status worsened and she required more oxygen. Narcan was given with some improvement in mental status. Ativan was reversed with flumazenil as well. CT head shows acute and chronic subdural hemorrhage. The patient is not anticoagulated.  SDH discussed with Dr. Wynetta Emery.. Given age and DNR, does not recommend any intervention or further workup.  ABG shows  respiratory acidosis. multifactorial respiratory failure  from CHF and COPD. patient is given nebulizers, steroids, IV Lasix. As her mental status improved she is able to tolerate BiPAP.  She is diuresing well with IV Lasix. primaxin started for UTI and history of ESBL. Abdominal imaging is negative for acute pathology or source of patient's pain. Admission to step down discussed with Dr. Julian Reil. Patient remains DNR.  grandson aware of patient's guarded prognosis and severe illness.   CRITICAL CARE Performed by: Glynn Octave Total critical care time: 45 Critical care time was exclusive of separately billable procedures and treating other patients. Critical care was necessary to treat or prevent imminent or life-threatening deterioration. Critical care was time spent personally by me on the following activities: development of treatment plan with patient and/or surrogate as well as nursing, discussions with consultants, evaluation of patient's response to treatment, examination of patient, obtaining history from patient or surrogate, ordering and performing treatments and interventions, ordering and review of laboratory studies, ordering and review of radiographic studies, pulse oximetry and re-evaluation of patient's condition.   Glynn Octave, MD 12/28/13 [phone]

## 2013-12-28 ENCOUNTER — Telehealth: Payer: Self-pay | Admitting: *Deleted

## 2013-12-28 ENCOUNTER — Emergency Department (HOSPITAL_COMMUNITY): Payer: Medicare PPO

## 2013-12-28 DIAGNOSIS — I5023 Acute on chronic systolic (congestive) heart failure: Secondary | ICD-10-CM | POA: Diagnosis present

## 2013-12-28 DIAGNOSIS — I6200 Nontraumatic subdural hemorrhage, unspecified: Secondary | ICD-10-CM

## 2013-12-28 DIAGNOSIS — I509 Heart failure, unspecified: Secondary | ICD-10-CM

## 2013-12-28 DIAGNOSIS — N390 Urinary tract infection, site not specified: Secondary | ICD-10-CM

## 2013-12-28 DIAGNOSIS — J9600 Acute respiratory failure, unspecified whether with hypoxia or hypercapnia: Secondary | ICD-10-CM

## 2013-12-28 DIAGNOSIS — J9602 Acute respiratory failure with hypercapnia: Secondary | ICD-10-CM | POA: Diagnosis present

## 2013-12-28 DIAGNOSIS — I4891 Unspecified atrial fibrillation: Secondary | ICD-10-CM

## 2013-12-28 LAB — POCT I-STAT 3, ART BLOOD GAS (G3+)
Bicarbonate: 29.1 mEq/L — ABNORMAL HIGH (ref 20.0–24.0)
O2 Saturation: 99 %
PH ART: 7.278 — AB (ref 7.350–7.450)
TCO2: 31 mmol/L (ref 0–100)
pCO2 arterial: 62.1 mmHg (ref 35.0–45.0)
pO2, Arterial: 168 mmHg — ABNORMAL HIGH (ref 80.0–100.0)

## 2013-12-28 LAB — URINALYSIS, ROUTINE W REFLEX MICROSCOPIC
Bilirubin Urine: NEGATIVE
Glucose, UA: NEGATIVE mg/dL
KETONES UR: NEGATIVE mg/dL
NITRITE: NEGATIVE
PH: 5.5 (ref 5.0–8.0)
Protein, ur: 30 mg/dL — AB
Specific Gravity, Urine: 1.018 (ref 1.005–1.030)
Urobilinogen, UA: 1 mg/dL (ref 0.0–1.0)

## 2013-12-28 LAB — COMPREHENSIVE METABOLIC PANEL
ALBUMIN: 3.1 g/dL — AB (ref 3.5–5.2)
ALT: 46 U/L — ABNORMAL HIGH (ref 0–35)
AST: 40 U/L — ABNORMAL HIGH (ref 0–37)
Alkaline Phosphatase: 139 U/L — ABNORMAL HIGH (ref 39–117)
BUN: 31 mg/dL — AB (ref 6–23)
CHLORIDE: 92 meq/L — AB (ref 96–112)
CO2: 26 mEq/L (ref 19–32)
Calcium: 9.4 mg/dL (ref 8.4–10.5)
Creatinine, Ser: 1.2 mg/dL — ABNORMAL HIGH (ref 0.50–1.10)
GFR calc Af Amer: 43 mL/min — ABNORMAL LOW (ref >90)
GFR calc non Af Amer: 37 mL/min — ABNORMAL LOW (ref >90)
Glucose, Bld: 150 mg/dL — ABNORMAL HIGH (ref 70–99)
Potassium: 4.1 mEq/L (ref 3.7–5.3)
Sodium: 134 mEq/L — ABNORMAL LOW (ref 137–147)
TOTAL PROTEIN: 6.8 g/dL (ref 6.0–8.3)
Total Bilirubin: 0.4 mg/dL (ref 0.3–1.2)

## 2013-12-28 LAB — URINE MICROSCOPIC-ADD ON

## 2013-12-28 LAB — PRO B NATRIURETIC PEPTIDE: Pro B Natriuretic peptide (BNP): 10512 pg/mL — ABNORMAL HIGH (ref 0–450)

## 2013-12-28 LAB — TROPONIN I: Troponin I: <0.3 ng/mL (ref <0.30)

## 2013-12-28 LAB — PROTIME-INR
INR: 1.02 (ref 0.00–1.49)
PROTHROMBIN TIME: 13.2 s (ref 11.6–15.2)

## 2013-12-28 LAB — LIPASE, BLOOD: LIPASE: 38 U/L (ref 11–59)

## 2013-12-28 MED ORDER — NALOXONE HCL 0.4 MG/ML IJ SOLN
0.4000 mg | Freq: Once | INTRAMUSCULAR | Status: AC
  Administered 2013-12-28: 0.4 mg via INTRAVENOUS
  Filled 2013-12-28: qty 1

## 2013-12-28 MED ORDER — FUROSEMIDE 10 MG/ML IJ SOLN
40.0000 mg | Freq: Once | INTRAMUSCULAR | Status: AC
  Administered 2013-12-28: 40 mg via INTRAVENOUS
  Filled 2013-12-28: qty 4

## 2013-12-28 MED ORDER — ACETAMINOPHEN 325 MG PO TABS: 650.0000 mg | ORAL_TABLET | ORAL | Status: DC | PRN

## 2013-12-28 MED ORDER — POLYETHYLENE GLYCOL 3350 17 G PO PACK
17.0000 g | PACK | Freq: Every day | ORAL | Status: DC | PRN
  Filled 2013-12-28: qty 1

## 2013-12-28 MED ORDER — SODIUM CHLORIDE 0.9 % IJ SOLN
3.0000 mL | Freq: Two times a day (BID) | INTRAMUSCULAR | Status: DC
  Administered 2013-12-29 – 2014-01-01 (×7): 3 mL via INTRAVENOUS

## 2013-12-28 MED ORDER — FUROSEMIDE 10 MG/ML IJ SOLN
40.0000 mg | Freq: Two times a day (BID) | INTRAMUSCULAR | Status: DC
  Administered 2013-12-28 – 2013-12-30 (×4): 40 mg via INTRAVENOUS
  Filled 2013-12-28 (×7): qty 4

## 2013-12-28 MED ORDER — ALBUTEROL SULFATE (2.5 MG/3ML) 0.083% IN NEBU: 2.5000 mg | INHALATION_SOLUTION | RESPIRATORY_TRACT | Status: DC | PRN

## 2013-12-28 MED ORDER — ONDANSETRON HCL 4 MG/2ML IJ SOLN: 4.0000 mg | Freq: Four times a day (QID) | INTRAMUSCULAR | Status: DC | PRN

## 2013-12-28 MED ORDER — ALBUTEROL SULFATE (2.5 MG/3ML) 0.083% IN NEBU
5.0000 mg | INHALATION_SOLUTION | Freq: Once | RESPIRATORY_TRACT | Status: AC
  Administered 2013-12-28: 5 mg via RESPIRATORY_TRACT
  Filled 2013-12-28: qty 6

## 2013-12-28 MED ORDER — SODIUM CHLORIDE 0.9 % IV SOLN
250.0000 mL | INTRAVENOUS | Status: DC | PRN
  Administered 2013-12-28: 250 mL via INTRAVENOUS

## 2013-12-28 MED ORDER — ENSURE COMPLETE PO LIQD
237.0000 mL | Freq: Two times a day (BID) | ORAL | Status: DC
  Administered 2013-12-28 – 2014-01-02 (×9): 237 mL via ORAL
  Filled 2013-12-28: qty 237

## 2013-12-28 MED ORDER — IPRATROPIUM-ALBUTEROL 0.5-2.5 (3) MG/3ML IN SOLN
3.0000 mL | Freq: Four times a day (QID) | RESPIRATORY_TRACT | Status: DC
  Administered 2013-12-28: 3 mL via RESPIRATORY_TRACT
  Filled 2013-12-28 (×2): qty 3

## 2013-12-28 MED ORDER — IPRATROPIUM BROMIDE 0.02 % IN SOLN
0.5000 mg | Freq: Once | RESPIRATORY_TRACT | Status: AC
  Administered 2013-12-28: 0.5 mg via RESPIRATORY_TRACT
  Filled 2013-12-28: qty 2.5

## 2013-12-28 MED ORDER — SODIUM CHLORIDE 0.9 % IJ SOLN: 3.0000 mL | INTRAMUSCULAR | Status: DC | PRN

## 2013-12-28 MED ORDER — SODIUM CHLORIDE 0.9 % IV SOLN
250.0000 mg | Freq: Once | INTRAVENOUS | Status: AC
  Administered 2013-12-28: 250 mg via INTRAVENOUS
  Filled 2013-12-28: qty 250

## 2013-12-28 MED ORDER — FLUTICASONE PROPIONATE 50 MCG/ACT NA SUSP: 2.0000 | Freq: Every day | NASAL | Status: DC | PRN

## 2013-12-28 MED ORDER — METOPROLOL TARTRATE 1 MG/ML IV SOLN
2.5000 mg | Freq: Four times a day (QID) | INTRAVENOUS | Status: DC | PRN
  Administered 2013-12-30: 2.5 mg via INTRAVENOUS
  Filled 2013-12-28: qty 5

## 2013-12-28 MED ORDER — PANTOPRAZOLE SODIUM 40 MG PO TBEC
40.0000 mg | DELAYED_RELEASE_TABLET | Freq: Every day | ORAL | Status: DC
  Administered 2013-12-28 – 2014-01-02 (×6): 40 mg via ORAL
  Filled 2013-12-28 (×4): qty 1

## 2013-12-28 MED ORDER — METHYLPREDNISOLONE SODIUM SUCC 125 MG IJ SOLR
125.0000 mg | Freq: Once | INTRAMUSCULAR | Status: AC
  Administered 2013-12-28: 125 mg via INTRAVENOUS
  Filled 2013-12-28: qty 2

## 2013-12-28 MED ORDER — FLUMAZENIL 0.5 MG/5ML IV SOLN
0.2000 mg | Freq: Once | INTRAVENOUS | Status: AC
  Administered 2013-12-28: 0.2 mg via INTRAVENOUS
  Filled 2013-12-28: qty 5

## 2013-12-28 MED ORDER — PREDNISONE 20 MG PO TABS
40.0000 mg | ORAL_TABLET | Freq: Every day | ORAL | Status: DC
  Administered 2013-12-29: 40 mg via ORAL
  Filled 2013-12-28 (×2): qty 2

## 2013-12-28 MED ORDER — CILOSTAZOL 100 MG PO TABS
100.0000 mg | ORAL_TABLET | Freq: Two times a day (BID) | ORAL | Status: DC
  Administered 2013-12-28: 100 mg via ORAL
  Filled 2013-12-28 (×2): qty 1

## 2013-12-28 MED ORDER — FUROSEMIDE 10 MG/ML IJ SOLN
60.0000 mg | Freq: Four times a day (QID) | INTRAMUSCULAR | Status: DC
  Administered 2013-12-28: 60 mg via INTRAVENOUS
  Filled 2013-12-28: qty 6

## 2013-12-28 MED ORDER — SODIUM CHLORIDE 0.9 % IV SOLN
250.0000 mg | Freq: Three times a day (TID) | INTRAVENOUS | Status: DC
  Administered 2013-12-28 – 2013-12-29 (×4): 250 mg via INTRAVENOUS
  Filled 2013-12-28 (×6): qty 250

## 2013-12-28 MED ORDER — DILTIAZEM HCL ER 240 MG PO CP24
240.0000 mg | ORAL_CAPSULE | Freq: Every day | ORAL | Status: DC
  Administered 2013-12-28 – 2014-01-02 (×6): 240 mg via ORAL
  Filled 2013-12-28 (×6): qty 1

## 2013-12-28 NOTE — ED Notes (Signed)
Pulse ox placed on ear for better reading. SpO2 showing 96-100%.

## 2013-12-28 NOTE — ED Notes (Signed)
MD at bedside. 

## 2013-12-28 NOTE — ED Notes (Signed)
Rapid response RN at bedside to verify pt can go to telemetry floor.

## 2013-12-28 NOTE — ED Notes (Signed)
Ultrasound at bedside

## 2013-12-28 NOTE — H&P (Signed)
[facility] History and Physical  [name] BJY:[id number] DOB: 26-May-1915 DOA: 12/27/2013  Referring physician: EDP PCP: [name], MD   Chief Complaint: Abd pain, headache   HPI: [name] is a 80 y.o. female who presents to the ED with report of "a four-day history of upper abdominal pain and headache. She is asking for something for pain. She denies any vomiting though her grandson states that she had swollen up. She denies any fever, chills, change in bowel habits. Her headache is diffuse and gradual in onset. No focal weakness, numbness or tingling. Denies any falls. She denies any chest pain or shortness of breath. She's anxious and tachypnea. She is DO NOT RESUSCITATE per records which is confirmed by family." Per EDP  Unfortunately since arrival in the ED her mental and respiratory status have deteriorated after being given fentanyl for pain and the patient is able to provide little more in the way of subjective history.  Work up demonstrates findings of severe CHF with large B pleural effusions, acute hypercapnic and hypoxic respiratory failure, a subacute SDH which is minor (and neuro surgery has no intervention plans on anyhow).  Imaging of her abdomen and pelvis including CT and RUQ ultrasound is unremarkable for a source of her pain.  UA does demonstrate UTI, she is started on primaxin due to a history of ESBL in the past.  Her mental status is improving slightly after narcan, lasix, and primaxin.   Review of Systems: Unable to perform due to AMS.  Past Medical History  Diagnosis Date  . CAD (coronary artery disease)   . Hyperlipidemia   . Osteoporosis   . PVD (peripheral vascular disease)   . HTN (hypertension)   . Diverticulosis of colon   . GERD (gastroesophageal reflux disease)   . Hip fracture, right   . Decubitus ulcer     Right heel  . COPD (chronic obstructive pulmonary disease)   . Anxiety   . Anemia   . Esophageal stricture   . CHF  (congestive heart failure)   . Atrial fibrillation   . Fibromuscular dysplasia     Renal artery  . Myocardial infarction    Past Surgical History  Procedure Laterality Date  . Ptca  04/2003  . Cataract extraction      Bilateral  . Orif acetabular fracture  2009   Social History:  reports that she has never smoked. She has never used smokeless tobacco. She reports that she does not drink alcohol or use illicit drugs.  Allergies  Allergen Reactions  . Codeine Phosphate     Makes her hurt  . Doxycycline Other (See Comments)    unknown  . Erythromycin Other (See Comments)    unknown    History reviewed. No pertinent family history.   Prior to Admission medications   Medication Sig Start Date End Date Taking? Authorizing Provider  aspirin 325 MG tablet Take 325 mg by mouth daily.     Yes Historical Provider, MD  cilostazol (PLETAL) 100 MG tablet Take 100 mg by mouth 2 (two) times daily.   Yes Historical Provider, MD  clopidogrel (PLAVIX) 75 MG tablet Take 75 mg by mouth daily.   Yes Historical Provider, MD  diltiazem (DILACOR XR) 240 MG 24 hr capsule Take 240 mg by mouth daily.   Yes Historical Provider, MD  feeding supplement, ENSURE COMPLETE, (ENSURE COMPLETE) LIQD Take 237 mLs by mouth 2 (two) times daily between meals. 12/08/13  Yes Jacques Navy, MD  fluticasone (FLONASE) 50 MCG/ACT nasal spray Place 2 sprays into the nose daily as needed for rhinitis or allergies.   Yes Historical Provider, MD  furosemide (LASIX) 40 MG tablet Take 40 mg by mouth daily.   Yes Historical Provider, MD  pantoprazole (PROTONIX) 40 MG tablet Take 40 mg by mouth daily.   Yes Historical Provider, MD  polyethylene glycol (MIRALAX / GLYCOLAX) packet Take 17 g by mouth daily as needed.   Yes Historical Provider, MD  triamcinolone cream (KENALOG) 0.1 % Apply 1 application topically 2 (two) times daily as needed. For skin irritation 11/02/13  Yes Historical Provider, MD  zinc oxide (BALMEX) 11.3 % CREA  cream Apply 1 application topically 3 (three) times daily.   Yes Historical Provider, MD   Physical Exam: Filed Vitals:   12/28/13 0246  BP: 131/106  Pulse: 92  Temp:   Resp: 31    BP 131/106  Pulse 92  Temp(Src) 97.7 F (36.5 C) (Oral)  Resp 31  Ht 5\' 3"  (1.6 m)  Wt 61.8 kg (136 lb 3.9 oz)  BMI 24.14 kg/m2  SpO2 99%  General Appearance:    Alert, oriented, no distress, appears stated age  Head:    Normocephalic, atraumatic  Eyes:    PERRL, EOMI, sclera non-icteric        Nose:   Nares without drainage or epistaxis. Mucosa, turbinates normal  Throat:   Moist mucous membranes. Oropharynx without erythema or exudate.  Neck:   Supple. No carotid bruits.  No thyromegaly.  No lymphadenopathy.   Back:     No CVA tenderness, no spinal tenderness  Lungs:     B rhonchi, decreased BS over lower lung fields.  Chest wall:    No tenderness to palpitation  Heart:    Regular rate and rhythm without murmurs, gallops, rubs  Abdomen:     Tender in RUQ and epigastrium  Genitalia:    deferred  Rectal:    deferred  Extremities:   No clubbing, cyanosis or edema.  Pulses:   2+ and symmetric all extremities  Skin:   Skin color, texture, turgor normal, no rashes or lesions  Lymph nodes:   Cervical, supraclavicular, and axillary nodes normal  Neurologic:   CNII-XII intact. Normal strength, sensation and reflexes      throughout    Labs on Admission:  Basic Metabolic Panel:  Recent Labs Lab 12/27/13 2328  NA 134*  K 4.1  CL 92*  CO2 26  GLUCOSE 150*  BUN 31*  CREATININE 1.20*  CALCIUM 9.4   Liver Function Tests:  Recent Labs Lab 12/27/13 2328  AST 40*  ALT 46*  ALKPHOS 139*  BILITOT 0.4  PROT 6.8  ALBUMIN 3.1*    Recent Labs Lab 12/27/13 2328  LIPASE 38   No results found for this basename: AMMONIA,  in the last 168 hours CBC:  Recent Labs Lab 12/27/13 2328  WBC 6.9  NEUTROABS 4.0  HGB 14.6  HCT 43.3  MCV 86.8  PLT 360   Cardiac Enzymes:  Recent  Labs Lab 12/27/13 2254  TROPONINI <0.30    BNP (last 3 results)  Recent Labs  06/29/13 1155 12/01/13 1800 12/27/13 2254  PROBNP 635.0* 4315.0* 10512.0*   CBG: No results found for this basename: GLUCAP,  in the last 168 hours  Radiological Exams on Admission: Ct Abdomen Pelvis Wo Contrast  12/28/2013   CLINICAL DATA:  Abdominal pain and headache.  EXAM: CT ABDOMEN AND PELVIS WITHOUT CONTRAST  TECHNIQUE: Multidetector  CT imaging of the abdomen and pelvis was performed following the standard protocol without intravenous contrast.  COMPARISON:  None.  FINDINGS: BODY WALL: Unremarkable.  LOWER CHEST: Cardiomegaly with coronary artery atherosclerosis and mitral annular calcification. There are moderate, layering bilateral pleural effusions and bibasilar atelectasis.  ABDOMEN/PELVIS:  Liver: No focal abnormality.  Biliary: No evidence of biliary obstruction or stone.  Pancreas: Unremarkable.  Spleen: Granulomatous changes.  Adrenals: Unremarkable.  Kidneys and ureters: No hydronephrosis or stone.  Bladder: Unremarkable.  Reproductive: Unremarkable.  Bowel: No bowel obstruction. Extensive distal colonic diverticulosis. Normal appendix.  Retroperitoneum: No mass or adenopathy.  Peritoneum: No free fluid or gas.  Vascular: Extensive aortic and branch vessel atherosclerosis.  OSSEOUS: Located right hip hemiarthroplasty. L1 and L2 compression fractures which are remote. The L2 compression has progressed since 03/22/2012. No acute fracture seen. Grade 1 L5-S1 anterolisthesis with advanced facet osteoarthritis.  IMPRESSION: 1. As permitted by respiratory motion, no acute intra-abdominal findings. 2. Moderate bilateral pleural effusions with atelectasis.   Electronically Signed   By: Tiburcio Pea M.D.   On: 12/28/2013 02:58   Ct Head Wo Contrast  12/28/2013   CLINICAL DATA:  Headache  EXAM: CT HEAD WITHOUT CONTRAST  TECHNIQUE: Contiguous axial images were obtained from the base of the skull through the  vertex without intravenous contrast.  COMPARISON:  Prior CT from 02/15/2008  FINDINGS: Diffuse age-related cerebral atrophy with chronic microvascular ischemic disease is present. Findings are advanced as compared to most recent CT examination from 02/15/2008.  Right parafalcine hyperdensity measuring 1.7 x 1.3 cm is seen along the anterior falx (series 2, image 19). Second focus of hyperdensity seen within the right parafalcine region is located slightly more cephalad and measures 1.6 by is 0.7 cm. Findings are most consistent with acute/ subacute subdural hemorrhage. There is no significant mass effect about these bleeds. No midline shift or hydrocephalus. No other intracranial hemorrhage.  The calvarium is intact. Orbits are normal. Paranasal sinuses and mastoid air cells are clear.  IMPRESSION: 1. Acute/early subacute right parafalcine subdural hemorrhage as above. No significant mass effect, midline shift, or hydrocephalus. 2. Advanced age-related atrophy with chronic microvascular ischemic disease, progressed as compared to 2009. Critical Value/emergent results were called by telephone at the time of interpretation on 12/28/2013 at 2:26 AM to Dr. Glynn Octave , who verbally acknowledged these results.   Electronically Signed   By: Rise Mu M.D.   On: 12/28/2013 02:29   Dg Chest Portable 1 View  12/28/2013   CLINICAL DATA:  Abdominal pain and headache  EXAM: PORTABLE CHEST - 1 VIEW  COMPARISON:  12/07/2013  FINDINGS: Cardiopericardial enlargement which is chronic. Diffuse interstitial coarsening. Kerley B-lines. Bilateral pleural effusions which are layering. No pneumothorax.  IMPRESSION: CHF, including pleural effusions.   Electronically Signed   By: Tiburcio Pea M.D.   On: 12/28/2013 00:59   US Abdomen Limited Ruq  12/28/2013   CLINICAL DATA:  Right upper quadrant pain  EXAM: US ABDOMEN LIMITED - RIGHT UPPER QUADRANT  COMPARISON:  None.  FINDINGS: Gallbladder  Few mid level echoes may  represent sludge. No stones. No wall thickening or sonographic Murphy sign.  Common bile duct  Diameter: 2 mm  Liver:  No focal lesion identified. Within normal limits in parenchymal echogenicity.  Other:  Moderate right pleural effusion.  IMPRESSION: 1. Negative for cholelithiasis or acute cholecystitis. 2. Right pleural effusion.   Electronically Signed   By: Tiburcio Pea M.D.   On: 12/28/2013 02:13  EKG: Independently reviewed.  Assessment/Plan Principal Problem:   Acute respiratory failure with hypercapnia Active Problems:   Acute on chronic systolic CHF (congestive heart failure), NYHA class 2   UTI (lower urinary tract infection)   1. Acute respiratory failure with hypoxia and hypercapnea - secondary to severe acute systolic CHF.  Lasix given in ED, patient waking up a little bit so will see if she tolerates BIPAP.  BP in the 110s so not candidate for NTG at this time.  Unlikely that PCCM will want to perform emergent paracentesis on this DNR/DNI 80 year old lady. 2. SDH - subacute, stopping blood thinners, no current plans for further work up or intervention per neurosurgery 3. UTI - primaxin, cultures pending 4. Prognosis is guarded at best at this time.    Code Status: DNR/DNI - this is confirmed in person to EDP by grandson  Family Communication: No family in room, EDP spoke with grandson. Disposition Plan: Admit to SDU, prognosis guarded   Time spent: 70 min  GARDNER, JARED M. [facility] Pager [phone]  If 7AM-7PM, please contact the day team taking care of the patient [email or web address] Password [email or web address] 12/28/2013, 3:39 AM

## 2013-12-28 NOTE — ED Notes (Signed)
IV team notified to restart IV-- this nurse attempted x 1, unsuccessful, pt has multiple bruises on left arm from IV sticks and blood draws.  Dietary notified to send lunch tray

## 2013-12-28 NOTE — ED Notes (Signed)
Attempted report 

## 2013-12-28 NOTE — Progress Notes (Signed)
80yo female c/o abdominal pain and generalized pain, UA abnormal, urine Cx from 40mo ago reveals Ecoli resistant to ceftriaxone and FQs, to begin IV ABX.  Will start Primaxin 250mg  IV Q8H for wt ~62kg and CrCl ~20 ml/min and monitor CBC and Cx.  Vernard GamblesVeronda Quinne Pires, PharmD, BCPS  12/28/2013 1:04 AM

## 2013-12-28 NOTE — Telephone Encounter (Signed)
Appreciate update. From review of the record she has recurrent heart failure and recurrent urinary track infection.  I no longer provide in-patient care but hope to get by the hospital Friday afternoon or Saturday to talk with her about goals of care and hospice.

## 2013-12-28 NOTE — ED Notes (Signed)
Lab called about pt's troponin. Lab states they are running it right now

## 2013-12-28 NOTE — Telephone Encounter (Signed)
Daughter phoned stating pt was [facility] yesterday...[facility] that [facility]" which dtr, as of this time, still does not know what her "condition" is. States pt was c/o headache, abd pain and extreme generalized pain.  Dtr was simply phoning to make sure that pt's pcp was aware.  States that as of shortly after 1pm today, pt is still in emergency room, awaiting an inpatient bed. Dtr states that hospital staff has told her that pt is "hooked up to oxygen & can't speak".  She was taken to the emergency room via EMS last night around 10pm.  Please advise if further orders are needed.  Dtr states she wished you were able to follow/attend patient's care while admitted to hospital.   CB# [phone]

## 2013-12-28 NOTE — Progress Notes (Addendum)
PATIENT DETAILS Name: [name] Age: 80 y.o. Sex: female Date of Birth: 24-Apr-1915 Admit Date: 12/27/2013 Admitting Physician No admitting provider for patient encounter. ZOX:WRUEAVW Norins, MD  Subjective: Initially on BiPAP, removed-awake and alert. Appears comfortable.  Assessment/Plan: Principal Problem:   Acute respiratory failure with hypercapnia - Multifactorial- suspect secondary to narcotics given in the emergency room, with contributions from his underlying COPD, acute systolic heart failure. - Started on BiPAP, during rounds this morning, this was discontinued. Patient comfortable with just nasal cannula. - Avoid sedating medications as much as possible. - Will continue to treat COPD and CHF.  Small subdural hematoma - Case was discussed by ED M.D. with neurosurgery (Dr. Wynetta Emery) does not recommend any further intervention. Avoid antiplatelets and anticoagulation at this point.    Acute on chronic systolic CHF (congestive heart failure) - Continue Lasix, but will decrease the dose to 40 milligrams IV twice daily  - Ejection fraction on 12/02/13 was 30% with diffuse hypokinesis.  ? COPD exacerbation - Did have hypercapnia on admission-not sure if this was secondary to medications or from COPD exacerbation. Apparently given one dose of Solu-Medrol on admission. Will continue with scheduled nebulized bronchodilators. Will start with a short course of oral prednisone.    UTI (lower urinary tract infection) - Recent history of ESBL Escherichia coli, continue Primaxin. - Follow urine cultures.  Bilateral pleural effusions - Suspect secondary to history of chronic systolic heart failure - No indication for thoracocentesis at present - Continue to monitor and diurese with Lasix.  Abdominal pain -? Etiology - CT scan of the abdomen without contrast negative on admission. Belly appears soft on exam. - Will provide supportive care - Continue with PPI  History of  atrial fibrillation - Continue Cardizem. Not a anticoagulation candidate  Deconditioning - Ordered PT evaluation  Disposition: Remain inpatient- do not think she requires step down unit-transfer to telemetry  DVT Prophylaxis:  SCD's- given subdural hematoma  Code Status:  DNR  Family Communication Attempted to contact grandson-Larry cousins at [phone]-unsuccessful  Addendum 4:45 pm -spoke with Esmond Camper understands poor overall prognoses. No heroic measures. But wants to continue with medical treatment.   Procedures:  NONE  CONSULTS:  None  Time spent 40 minutes   MEDICATIONS: Scheduled Meds: . cilostazol  100 mg Oral BID  . diltiazem  240 mg Oral Daily  . feeding supplement (ENSURE COMPLETE)  237 mL Oral BID BM  . furosemide  60 mg Intravenous Q6H  . pantoprazole  40 mg Oral Daily  . sodium chloride  3 mL Intravenous Q12H   Continuous Infusions: . sodium chloride 250 mL (12/28/13 0344)  . imipenem-cilastatin Stopped (12/28/13 1305)   PRN Meds:.sodium chloride, acetaminophen, fluticasone, ondansetron (ZOFRAN) IV, polyethylene glycol, sodium chloride  Antibiotics: Anti-infectives   Start     Dose/Rate Route Frequency Ordered Stop   12/28/13 0800  imipenem-cilastatin (PRIMAXIN) 250 mg in sodium chloride 0.9 % 100 mL IVPB     250 mg 200 mL/hr over 30 Minutes Intravenous Every 8 hours 12/28/13 0101     12/28/13 0115  imipenem-cilastatin (PRIMAXIN) 250 mg in sodium chloride 0.9 % 100 mL IVPB     250 mg 200 mL/hr over 30 Minutes Intravenous  Once 12/28/13 0101 12/28/13 0219       PHYSICAL EXAM: Vital signs in last 24 hours: Filed Vitals:   12/28/13 1100 12/28/13 1206 12/28/13 1208 12/28/13 1256  BP: 142/103  127/78   Pulse: 118     Temp:  TempSrc:      Resp: 21  30 23   Height:      Weight:      SpO2: 96% 90% 90% 95%    Weight change:  Filed Weights   12/28/13 0047  Weight: 61.8 kg (136 lb 3.9 oz)   Body mass index is 24.14  kg/(m^2).   Gen Exam: Awake and alert with clear speech.  But slightly lethargic Neck: Supple, No JVD.   Chest: Few bibasilar rales CVS: S1 S2 Regular, no murmurs.  Abdomen: soft, BS +, non tender, non distended.  Extremities: no edema, lower extremities warm to touch Neurologic: Non Focal.  But generalized weakness positive   Intake/Output from previous day:  Intake/Output Summary (Last 24 hours) at 12/28/13 1308 Last data filed at 12/28/13 1218  Gross per 24 hour  Intake      0 ml  Output   1300 ml  Net  -1300 ml     LAB RESULTS: CBC  Recent Labs Lab 12/27/13 2328  WBC 6.9  HGB 14.6  HCT 43.3  PLT 360  MCV 86.8  MCH 29.3  MCHC 33.7  RDW 14.5  LYMPHSABS 2.1  MONOABS 0.5  EOSABS 0.2  BASOSABS 0.0    Chemistries   Recent Labs Lab 12/27/13 2328  NA 134*  K 4.1  CL 92*  CO2 26  GLUCOSE 150*  BUN 31*  CREATININE 1.20*  CALCIUM 9.4    CBG: No results found for this basename: GLUCAP,  in the last 168 hours  GFR Estimated Creatinine Clearance: 22.7 ml/min (by C-G formula based on Cr of 1.2).  Coagulation profile  Recent Labs Lab 12/28/13 0232  INR 1.02    Cardiac Enzymes  Recent Labs Lab 12/27/13 2254 12/28/13 0348 12/28/13 0935  TROPONINI <0.30 <0.30 <0.30    No components found with this basename: POCBNP,  No results found for this basename: DDIMER,  in the last 72 hours No results found for this basename: HGBA1C,  in the last 72 hours No results found for this basename: CHOL, HDL, LDLCALC, TRIG, CHOLHDL, LDLDIRECT,  in the last 72 hours No results found for this basename: TSH, T4TOTAL, FREET3, T3FREE, THYROIDAB,  in the last 72 hours No results found for this basename: VITAMINB12, FOLATE, FERRITIN, TIBC, IRON, RETICCTPCT,  in the last 72 hours  Recent Labs  12/27/13 2328  LIPASE 38    Urine Studies No results found for this basename: UACOL, UAPR, USPG, UPH, UTP, UGL, UKET, UBIL, UHGB, UNIT, UROB, ULEU, UEPI, UWBC, URBC, UBAC,  CAST, CRYS, UCOM, BILUA,  in the last 72 hours  MICROBIOLOGY: No results found for this or any previous visit (from the past 240 hour(s)).  RADIOLOGY STUDIES/RESULTS: Ct Abdomen Pelvis Wo Contrast  12/28/2013   CLINICAL DATA:  Abdominal pain and headache.  EXAM: CT ABDOMEN AND PELVIS WITHOUT CONTRAST  TECHNIQUE: Multidetector CT imaging of the abdomen and pelvis was performed following the standard protocol without intravenous contrast.  COMPARISON:  None.  FINDINGS: BODY WALL: Unremarkable.  LOWER CHEST: Cardiomegaly with coronary artery atherosclerosis and mitral annular calcification. There are moderate, layering bilateral pleural effusions and bibasilar atelectasis.  ABDOMEN/PELVIS:  Liver: No focal abnormality.  Biliary: No evidence of biliary obstruction or stone.  Pancreas: Unremarkable.  Spleen: Granulomatous changes.  Adrenals: Unremarkable.  Kidneys and ureters: No hydronephrosis or stone.  Bladder: Unremarkable.  Reproductive: Unremarkable.  Bowel: No bowel obstruction. Extensive distal colonic diverticulosis. Normal appendix.  Retroperitoneum: No mass or adenopathy.  Peritoneum: No free fluid  or gas.  Vascular: Extensive aortic and branch vessel atherosclerosis.  OSSEOUS: Located right hip hemiarthroplasty. L1 and L2 compression fractures which are remote. The L2 compression has progressed since 03/22/2012. No acute fracture seen. Grade 1 L5-S1 anterolisthesis with advanced facet osteoarthritis.  IMPRESSION: 1. As permitted by respiratory motion, no acute intra-abdominal findings. 2. Moderate bilateral pleural effusions with atelectasis.   Electronically Signed   By: Tiburcio Pea M.D.   On: 12/28/2013 02:58   Dg Chest 2 View  12/07/2013   CLINICAL DATA:  Followup pneumonia  EXAM: CHEST  2 VIEW  COMPARISON:  12/03/2013  FINDINGS: Cardiac shadow remains enlarged. There has been significant improvement in degree of vascular congestion. Minimal atelectasis is noted in the left lung base. No  other focal abnormality is seen.  IMPRESSION: Significant improvement in vascular congestion and left basilar infiltrate. Minimal residual changes in the left base are noted.   Electronically Signed   By: Alcide Clever M.D.   On: 12/07/2013 07:58   X-ray Chest Pa And Lateral   12/01/2013   CLINICAL DATA:  Shortness of breath, weakness, history coronary artery disease post MI, CHF, hypertension, COPD  EXAM: CHEST  2 VIEW  COMPARISON:  07/04/2013  FINDINGS: Enlargement of cardiac silhouette with pulmonary vascular congestion.  Scattered interstitial prominence greater on left likely representing mildly asymmetric pulmonary edema and CHF.  Atherosclerotic calcification aorta.  No segmental consolidation or pneumothorax.  Dependent small posterior pleural effusions bilaterally.  Skin folds project over right lung on the PA view.  Diffuse osseous demineralization.  IMPRESSION: Enlargement of cardiac silhouette with pulmonary vascular congestion.  Mild CHF with small dependent pleural effusions bilaterally.   Electronically Signed   By: Ulyses Southward M.D.   On: 12/01/2013 17:23   Ct Head Wo Contrast  12/28/2013   CLINICAL DATA:  Headache  EXAM: CT HEAD WITHOUT CONTRAST  TECHNIQUE: Contiguous axial images were obtained from the base of the skull through the vertex without intravenous contrast.  COMPARISON:  Prior CT from 02/15/2008  FINDINGS: Diffuse age-related cerebral atrophy with chronic microvascular ischemic disease is present. Findings are advanced as compared to most recent CT examination from 02/15/2008.  Right parafalcine hyperdensity measuring 1.7 x 1.3 cm is seen along the anterior falx (series 2, image 19). Second focus of hyperdensity seen within the right parafalcine region is located slightly more cephalad and measures 1.6 by is 0.7 cm. Findings are most consistent with acute/ subacute subdural hemorrhage. There is no significant mass effect about these bleeds. No midline shift or hydrocephalus. No other  intracranial hemorrhage.  The calvarium is intact. Orbits are normal. Paranasal sinuses and mastoid air cells are clear.  IMPRESSION: 1. Acute/early subacute right parafalcine subdural hemorrhage as above. No significant mass effect, midline shift, or hydrocephalus. 2. Advanced age-related atrophy with chronic microvascular ischemic disease, progressed as compared to 2009. Critical Value/emergent results were called by telephone at the time of interpretation on 12/28/2013 at 2:26 AM to Dr. Glynn Octave , who verbally acknowledged these results.   Electronically Signed   By: Rise Mu M.D.   On: 12/28/2013 02:29   Dg Chest Portable 1 View  12/28/2013   CLINICAL DATA:  Abdominal pain and headache  EXAM: PORTABLE CHEST - 1 VIEW  COMPARISON:  12/07/2013  FINDINGS: Cardiopericardial enlargement which is chronic. Diffuse interstitial coarsening. Kerley B-lines. Bilateral pleural effusions which are layering. No pneumothorax.  IMPRESSION: CHF, including pleural effusions.   Electronically Signed   By: Audry Riles.D.  On: 12/28/2013 00:59   Dg Chest Port 1 View  12/03/2013   CLINICAL DATA:  History of congestive failure  EXAM: PORTABLE CHEST - 1 VIEW  COMPARISON:  For 12/01/2013  FINDINGS: Cardiac shadow is stable. The left basilar infiltrate with associated effusion is now noted. Vascular congestion with pulmonary edema is also noted. No bony abnormality is seen.  IMPRESSION: Congestive failure with new left basilar infiltrate   Electronically Signed   By: Alcide Clever M.D.   On: 12/03/2013 07:23   US Abdomen Limited Ruq  12/28/2013   CLINICAL DATA:  Right upper quadrant pain  EXAM: US ABDOMEN LIMITED - RIGHT UPPER QUADRANT  COMPARISON:  None.  FINDINGS: Gallbladder  Few mid level echoes may represent sludge. No stones. No wall thickening or sonographic Murphy sign.  Common bile duct  Diameter: 2 mm  Liver:  No focal lesion identified. Within normal limits in parenchymal echogenicity.  Other:   Moderate right pleural effusion.  IMPRESSION: 1. Negative for cholelithiasis or acute cholecystitis. 2. Right pleural effusion.   Electronically Signed   By: Tiburcio Pea M.D.   On: 12/28/2013 02:13    Jeoffrey Massed, MD  [facility] Pager:[phone]  If 7PM-7AM, please contact night-coverage [email or web address] Password [email or web address] 12/28/2013, 1:08 PM   LOS: 1 day

## 2013-12-28 NOTE — ED Notes (Signed)
Pt placed on Bi -Pap by respiratory

## 2013-12-28 NOTE — ED Notes (Signed)
Pt sleeping. bipap on, IV infusing in right forearm without difficulty at 20cc/hr.

## 2013-12-28 NOTE — ED Notes (Signed)
[facility] notified pt now being transported upstairs.

## 2013-12-28 NOTE — ED Notes (Signed)
IV team at bedside 

## 2013-12-29 ENCOUNTER — Inpatient Hospital Stay (HOSPITAL_COMMUNITY): Payer: Medicare PPO

## 2013-12-29 ENCOUNTER — Encounter (HOSPITAL_COMMUNITY): Payer: Self-pay | Admitting: General Practice

## 2013-12-29 LAB — URINALYSIS, ROUTINE W REFLEX MICROSCOPIC
Bilirubin Urine: NEGATIVE
Glucose, UA: NEGATIVE mg/dL
Ketones, ur: NEGATIVE mg/dL
Nitrite: NEGATIVE
PROTEIN: NEGATIVE mg/dL
SPECIFIC GRAVITY, URINE: 1.012 (ref 1.005–1.030)
Urobilinogen, UA: 0.2 mg/dL (ref 0.0–1.0)
pH: 5 (ref 5.0–8.0)

## 2013-12-29 LAB — URINE CULTURE: Colony Count: >=100000

## 2013-12-29 LAB — CBC
HCT: 37.4 % (ref 36.0–46.0)
Hemoglobin: 12.4 g/dL (ref 12.0–15.0)
MCH: 29.1 pg (ref 26.0–34.0)
MCHC: 33.2 g/dL (ref 30.0–36.0)
MCV: 87.8 fL (ref 78.0–100.0)
Platelets: 292 10*3/uL (ref 150–400)
RBC: 4.26 MIL/uL (ref 3.87–5.11)
RDW: 14.5 % (ref 11.5–15.5)
WBC: 8.9 10*3/uL (ref 4.0–10.5)

## 2013-12-29 LAB — URINE MICROSCOPIC-ADD ON

## 2013-12-29 LAB — BASIC METABOLIC PANEL
BUN: 38 mg/dL — ABNORMAL HIGH (ref 6–23)
CO2: 25 mEq/L (ref 19–32)
Calcium: 8.4 mg/dL (ref 8.4–10.5)
Chloride: 97 mEq/L (ref 96–112)
Creatinine, Ser: 1.22 mg/dL — ABNORMAL HIGH (ref 0.50–1.10)
GFR calc Af Amer: 42 mL/min — ABNORMAL LOW (ref >90)
GFR calc non Af Amer: 36 mL/min — ABNORMAL LOW (ref >90)
Glucose, Bld: 152 mg/dL — ABNORMAL HIGH (ref 70–99)
Potassium: 4.1 mEq/L (ref 3.7–5.3)
SODIUM: 135 meq/L — AB (ref 137–147)

## 2013-12-29 MED ORDER — PREDNISONE 20 MG PO TABS
20.0000 mg | ORAL_TABLET | Freq: Every day | ORAL | Status: DC
  Administered 2013-12-30: 20 mg via ORAL
  Filled 2013-12-29 (×2): qty 1

## 2013-12-29 MED ORDER — CEFUROXIME AXETIL 500 MG PO TABS
500.0000 mg | ORAL_TABLET | Freq: Two times a day (BID) | ORAL | Status: AC
  Administered 2013-12-29 – 2013-12-31 (×5): 500 mg via ORAL
  Filled 2013-12-29 (×6): qty 1

## 2013-12-29 MED ORDER — LISINOPRIL 2.5 MG PO TABS
2.5000 mg | ORAL_TABLET | Freq: Every day | ORAL | Status: DC
  Administered 2013-12-29 – 2014-01-02 (×5): 2.5 mg via ORAL
  Filled 2013-12-29 (×5): qty 1

## 2013-12-29 NOTE — Evaluation (Signed)
Physical Therapy Evaluation Patient Details Name: [name] MRN: [id number] DOB: 11-07-1915 Today's Date: 12/29/2013 Time: 7829-5621 PT Time Calculation (min): 17 min  PT Assessment / Plan / Recommendation History of Present Illness  [name] is a 80 y.o. female who presents to the ED with report of "a four-day history of upper abdominal pain and headache. She is asking for something for pain. She denies any vomiting though her grandson states that she had swollen up. She denies any fever, chills, change in bowel habits. Her headache is diffuse and gradual in onset. No focal weakness, numbness or tingling. Denies any falls. She denies any chest pain or shortness of breath. She's anxious and tachypnea. She is DO NOT RESUSCITATE per records which is confirmed by family." Per EDP  Clinical Impression  Pt presents with decreased strength and mobility.  Pt is at a high risk for falls and would benefit from acute PT services to address deficits and increase functional independence.  Pt will benefit from SNF at d/c as she needs 24 hour supervision due to high fall risk.    PT Assessment  Patient needs continued PT services    Follow Up Recommendations  SNF    Does the patient have the potential to tolerate intense rehabilitation      Barriers to Discharge Decreased caregiver support pt needs 24 hour supervision/assistance and her grandson works    Furniture conservator/restorer  None recommended by PT    Recommendations for Other Services     Frequency Min 3X/week    Precautions / Restrictions Precautions Precautions: Fall Restrictions Weight Bearing Restrictions: No   Pertinent Vitals/Pain No c/o pain      Mobility  Bed Mobility Overal bed mobility: Modified Independent Transfers Overall transfer level: Needs assistance Equipment used: Rolling walker (2 wheeled) Transfers: Sit to/from Stand Sit to Stand: Min assist General transfer comment: steadying assist for balance,  cues for safety with lines/tubes Ambulation/Gait Ambulation/Gait assistance: Min assist Ambulation Distance (Feet): 40 Feet Assistive device: Rolling walker (2 wheeled) General Gait Details: assist for balance, cues to remember to use RW    Exercises     PT Diagnosis: Generalized weakness;Difficulty walking  PT Problem List: Decreased strength;Decreased mobility;Decreased safety awareness;Decreased activity tolerance;Decreased balance;Decreased knowledge of use of DME PT Treatment Interventions: DME instruction;Therapeutic exercise;Gait training;Balance training;Stair training;Neuromuscular re-education;Modalities;Functional mobility training;Therapeutic activities;Patient/family education     PT Goals(Current goals can be found in the care plan section) Acute Rehab PT Goals Patient Stated Goal: get out of here soon PT Goal Formulation: With patient Time For Goal Achievement: 01/05/14 Potential to Achieve Goals: Good  Visit Information  Last PT Received On: 12/29/13 Assistance Needed: +1 History of Present Illness: [name] is a 80 y.o. female who presents to the ED with report of "a four-day history of upper abdominal pain and headache. She is asking for something for pain. She denies any vomiting though her grandson states that she had swollen up. She denies any fever, chills, change in bowel habits. Her headache is diffuse and gradual in onset. No focal weakness, numbness or tingling. Denies any falls. She denies any chest pain or shortness of breath. She's anxious and tachypnea. She is DO NOT RESUSCITATE per records which is confirmed by family." Per EDP       Prior Functioning  Home Living Family/patient expects to be discharged to:: Private residence Living Arrangements: Other relatives Available Help at Discharge: Family;Available PRN/intermittently Type of Home: House Home Layout: One level Home Equipment: Dan Humphreys -  2 wheels;Bedside commode Prior Function Level of  Independence: Independent with assistive device(s) Communication Communication: No difficulties    Cognition  Cognition Arousal/Alertness: Awake/alert Behavior During Therapy: WFL for tasks assessed/performed Overall Cognitive Status:  (forgetful, decreased safety with lines/O2)    Extremity/Trunk Assessment Upper Extremity Assessment Upper Extremity Assessment: Generalized weakness Lower Extremity Assessment Lower Extremity Assessment: Generalized weakness Cervical / Trunk Assessment Cervical / Trunk Assessment: Normal   Balance Balance Overall balance assessment: Needs assistance Standing balance support: No upper extremity supported Standing balance-Leahy Scale: Fair Standing balance comment: pt requires min A for static standing to brush her teeth. frequently rests on sink due to fatigue. requires assist for opening toothpaste. Able to stand 5 minutes  End of Session PT - End of Session Equipment Utilized During Treatment: Gait belt Activity Tolerance: Patient limited by fatigue Patient left: in bed;with call bell/phone within reach;with bed alarm set Nurse Communication: Mobility status  GP     Dorthy Magnussen 12/29/2013, 10:47 AM

## 2013-12-29 NOTE — Telephone Encounter (Signed)
Notified dtr of MD response & she states she will be there.

## 2013-12-29 NOTE — H&P (Signed)
PATIENT NOT FEELING UP TO FIN ISING HISTORY AT THIS TIME ,NO FAMILY IN ROOM

## 2013-12-29 NOTE — Progress Notes (Signed)
Chart reviewed.         PATIENT DETAILS Name: [name] Age: 80 y.o. Sex: female Date of Birth: 1915-04-05 Admit Date: 12/27/2013 Admitting Physician Dewayne ShorterShanker Levora DredgeM Ghimire, MD GNF:[id number]:Michael Norins, MD  Subjective: Does not feel well, but has a hard time qualifying further. Denies shortness of breath. No chest pain. No cough. No fevers or chills. Abdominal pain resolved. Eating a bit.  Assessment/Plan: Principal Problem:   Acute respiratory failure with hypercapnia - Multifactorial- suspect secondary to narcotics given in the emergency room, with contributions from his underlying COPD, acute systolic heart failure. - Started on BiPAP, during rounds this morning, this was discontinued. Patient comfortable with just nasal cannula. - Avoid sedating medications as much as possible. - Will continue to treat COPD and CHF. Repeat chest x-ray shows continued heart failure, but clinically, patient much improved. Will continue IV Lasix today and reassess. No wheeze today. Taper steroids quickly. Wean oxygen to room air as able.  Small subdural hematoma - Case was discussed by ED M.D. with neurosurgery (Dr. Wynetta Emeryram) does not recommend any further intervention. Avoid antiplatelets and anticoagulation at this point.    Acute on chronic systolic CHF (congestive heart failure) - Continue Lasix, but will decrease the dose to 40 milligrams IV twice daily  - Ejection fraction on 12/02/13 was 30% with diffuse hypokinesis. Unclear why patient is not on ACE inhibitor. Will start low dose and monitor.  ? COPD exacerbation - Did have hypercapnia on admission-not sure if this was secondary to medications or from COPD exacerbation. Apparently given one dose of Solu-Medrol on admission. Will continue with scheduled nebulized bronchodilators. Will start with a short course of oral prednisone.    UTI (lower urinary tract infection) - Recent history of ESBL Escherichia coli, continue Primaxin. Urine culture shows  multiple bacterial morphotypes. Initial UA contaminated with many squamous epithelial cells. We'll repeat UA and if improved, discontinue antibiotics altogether. In the meantime, change to oral antibiotic  Bilateral pleural effusions - Suspect secondary to history of chronic systolic heart failure - No indication for thoracocentesis at present - Continue to monitor and diurese with Lasix.  Abdominal pain -? Etiology - CT scan of the abdomen without contrast negative on admission. Belly appears soft on exam. - Will provide supportive care - Continue with PPI  patient reports it has improved.  History of atrial fibrillation - Continue Cardizem. Not a anticoagulation candidate  Deconditioning - Ordered PT evaluation  Disposition: Physical therapy recommending skilled nursing facility. It appears patient was just discharged from 1. Will discuss with family.   DVT Prophylaxis:  SCD's- given subdural hematoma  Code Status:  DNR  Family Communication Attempted to contact grandson-Larry cousins at [phone]-unsuccessful  Addendum 4:45 pm -spoke with Esmond CamperLarry Cousins-he understands poor overall prognoses. No heroic measures. But wants to continue with medical treatment.   Procedures:  NONE  CONSULTS:  None  Time spent 40 minutes   MEDICATIONS: Scheduled Meds: . diltiazem  240 mg Oral Daily  . feeding supplement (ENSURE COMPLETE)  237 mL Oral BID BM  . furosemide  40 mg Intravenous BID  . imipenem-cilastatin  250 mg Intravenous Q8H  . pantoprazole  40 mg Oral Daily  . predniSONE  40 mg Oral Q breakfast  . sodium chloride  3 mL Intravenous Q12H   Continuous Infusions:   PRN Meds:.sodium chloride, acetaminophen, albuterol, fluticasone, metoprolol, ondansetron (ZOFRAN) IV, polyethylene glycol, sodium chloride  Antibiotics: Anti-infectives   Start     Dose/Rate Route Frequency Ordered Stop  12/28/13 0800  imipenem-cilastatin (PRIMAXIN) 250 mg in sodium chloride 0.9 %  100 mL IVPB     250 mg 200 mL/hr over 30 Minutes Intravenous Every 8 hours 12/28/13 0101     12/28/13 0115  imipenem-cilastatin (PRIMAXIN) 250 mg in sodium chloride 0.9 % 100 mL IVPB     250 mg 200 mL/hr over 30 Minutes Intravenous  Once 12/28/13 0101 12/28/13 0219       PHYSICAL EXAM: Vital signs in last 24 hours: Filed Vitals:   12/28/13 1528 12/28/13 1700 12/28/13 2130 12/29/13 0508  BP:  133/77 114/63 130/71  Pulse:   88 80  Temp:   98.1 F (36.7 C) 97.8 F (36.6 C)  TempSrc:   Oral Oral  Resp:   18 18  Height:      Weight:    63.7 kg (140 lb 6.9 oz)  SpO2: 95%  97% 97%    Weight change: 1.9 kg (4 lb 3 oz) Filed Weights   12/28/13 0047 12/29/13 0508  Weight: 61.8 kg (136 lb 3.9 oz) 63.7 kg (140 lb 6.9 oz)   Body mass index is 24.88 kg/(m^2).   Gen Exam: Awake and alert with clear speech.  But slightly lethargic Neck: Supple, No JVD.   Chest: Few bibasilar rales CVS: S1 S2 Regular, no murmurs.  Abdomen: soft, BS +, non tender, non distended.  Extremities: no edema, lower extremities warm to touch Neurologic: Non Focal.  But generalized weakness positive   Intake/Output from previous day:  Intake/Output Summary (Last 24 hours) at 12/29/13 1152 Last data filed at 12/29/13 0510  Gross per 24 hour  Intake      0 ml  Output   1500 ml  Net  -1500 ml     LAB RESULTS: CBC  Recent Labs Lab 12/27/13 2328 12/29/13 0536  WBC 6.9 8.9  HGB 14.6 12.4  HCT 43.3 37.4  PLT 360 292  MCV 86.8 87.8  MCH 29.3 29.1  MCHC 33.7 33.2  RDW 14.5 14.5  LYMPHSABS 2.1  --   MONOABS 0.5  --   EOSABS 0.2  --   BASOSABS 0.0  --     Chemistries   Recent Labs Lab 12/27/13 2328 12/29/13 0536  NA 134* 135*  K 4.1 4.1  CL 92* 97  CO2 26 25  GLUCOSE 150* 152*  BUN 31* 38*  CREATININE 1.20* 1.22*  CALCIUM 9.4 8.4    CBG: No results found for this basename: GLUCAP,  in the last 168 hours  GFR Estimated Creatinine Clearance: 24.2 ml/min (by C-G formula based on Cr  of 1.22).  Coagulation profile  Recent Labs Lab 12/28/13 0232  INR 1.02    Cardiac Enzymes  Recent Labs Lab 12/28/13 0348 12/28/13 0935 12/28/13 1515  TROPONINI <0.30 <0.30 <0.30    No components found with this basename: POCBNP,  No results found for this basename: DDIMER,  in the last 72 hours No results found for this basename: HGBA1C,  in the last 72 hours No results found for this basename: CHOL, HDL, LDLCALC, TRIG, CHOLHDL, LDLDIRECT,  in the last 72 hours No results found for this basename: TSH, T4TOTAL, FREET3, T3FREE, THYROIDAB,  in the last 72 hours No results found for this basename: VITAMINB12, FOLATE, FERRITIN, TIBC, IRON, RETICCTPCT,  in the last 72 hours  Recent Labs  12/27/13 2328  LIPASE 38    Urine Studies No results found for this basename: UACOL, UAPR, USPG, UPH, UTP, UGL, UKET, UBIL, UHGB, UNIT, UROB,  ULEU, UEPI, UWBC, URBC, UBAC, CAST, CRYS, UCOM, BILUA,  in the last 72 hours  MICROBIOLOGY: Recent Results (from the past 240 hour(s))  URINE CULTURE     Status: None   Collection Time    12/28/13 12:18 AM      Result Value Range Status   Specimen Description URINE, RANDOM   Final   Special Requests NONE   Final   Culture  Setup Time     Final   Value: 12/28/2013 09:27     Performed at [facility] Count     Final   Value: >=100,000 COLONIES/ML     Performed at [facility]   Culture     Final   Value: Multiple bacterial morphotypes present, none predominant. Suggest appropriate recollection if clinically indicated.     Performed at [facility]   Report Status 12/29/2013 FINAL   Final    RADIOLOGY STUDIES/RESULTS: Ct Abdomen Pelvis Wo Contrast  12/28/2013   CLINICAL DATA:  Abdominal pain and headache.  EXAM: CT ABDOMEN AND PELVIS WITHOUT CONTRAST  TECHNIQUE: Multidetector CT imaging of the abdomen and pelvis was performed following the standard protocol without intravenous contrast.  COMPARISON:  None.   FINDINGS: BODY WALL: Unremarkable.  LOWER CHEST: Cardiomegaly with coronary artery atherosclerosis and mitral annular calcification. There are moderate, layering bilateral pleural effusions and bibasilar atelectasis.  ABDOMEN/PELVIS:  Liver: No focal abnormality.  Biliary: No evidence of biliary obstruction or stone.  Pancreas: Unremarkable.  Spleen: Granulomatous changes.  Adrenals: Unremarkable.  Kidneys and ureters: No hydronephrosis or stone.  Bladder: Unremarkable.  Reproductive: Unremarkable.  Bowel: No bowel obstruction. Extensive distal colonic diverticulosis. Normal appendix.  Retroperitoneum: No mass or adenopathy.  Peritoneum: No free fluid or gas.  Vascular: Extensive aortic and branch vessel atherosclerosis.  OSSEOUS: Located right hip hemiarthroplasty. L1 and L2 compression fractures which are remote. The L2 compression has progressed since 03/22/2012. No acute fracture seen. Grade 1 L5-S1 anterolisthesis with advanced facet osteoarthritis.  IMPRESSION: 1. As permitted by respiratory motion, no acute intra-abdominal findings. 2. Moderate bilateral pleural effusions with atelectasis.   Electronically Signed   By: Tiburcio Pea M.D.   On: 12/28/2013 02:58   Dg Chest 2 View  12/07/2013   CLINICAL DATA:  Followup pneumonia  EXAM: CHEST  2 VIEW  COMPARISON:  12/03/2013  FINDINGS: Cardiac shadow remains enlarged. There has been significant improvement in degree of vascular congestion. Minimal atelectasis is noted in the left lung base. No other focal abnormality is seen.  IMPRESSION: Significant improvement in vascular congestion and left basilar infiltrate. Minimal residual changes in the left base are noted.   Electronically Signed   By: Alcide Clever M.D.   On: 12/07/2013 07:58   X-ray Chest Pa And Lateral   12/01/2013   CLINICAL DATA:  Shortness of breath, weakness, history coronary artery disease post MI, CHF, hypertension, COPD  EXAM: CHEST  2 VIEW  COMPARISON:  07/04/2013  FINDINGS:  Enlargement of cardiac silhouette with pulmonary vascular congestion.  Scattered interstitial prominence greater on left likely representing mildly asymmetric pulmonary edema and CHF.  Atherosclerotic calcification aorta.  No segmental consolidation or pneumothorax.  Dependent small posterior pleural effusions bilaterally.  Skin folds project over right lung on the PA view.  Diffuse osseous demineralization.  IMPRESSION: Enlargement of cardiac silhouette with pulmonary vascular congestion.  Mild CHF with small dependent pleural effusions bilaterally.   Electronically Signed   By: Ulyses Southward M.D.   On: 12/01/2013  17:23   Ct Head Wo Contrast  12/28/2013   CLINICAL DATA:  Headache  EXAM: CT HEAD WITHOUT CONTRAST  TECHNIQUE: Contiguous axial images were obtained from the base of the skull through the vertex without intravenous contrast.  COMPARISON:  Prior CT from 02/15/2008  FINDINGS: Diffuse age-related cerebral atrophy with chronic microvascular ischemic disease is present. Findings are advanced as compared to most recent CT examination from 02/15/2008.  Right parafalcine hyperdensity measuring 1.7 x 1.3 cm is seen along the anterior falx (series 2, image 19). Second focus of hyperdensity seen within the right parafalcine region is located slightly more cephalad and measures 1.6 by is 0.7 cm. Findings are most consistent with acute/ subacute subdural hemorrhage. There is no significant mass effect about these bleeds. No midline shift or hydrocephalus. No other intracranial hemorrhage.  The calvarium is intact. Orbits are normal. Paranasal sinuses and mastoid air cells are clear.  IMPRESSION: 1. Acute/early subacute right parafalcine subdural hemorrhage as above. No significant mass effect, midline shift, or hydrocephalus. 2. Advanced age-related atrophy with chronic microvascular ischemic disease, progressed as compared to 2009. Critical Value/emergent results were called by telephone at the time of interpretation  on 12/28/2013 at 2:26 AM to Dr. Glynn Octave , who verbally acknowledged these results.   Electronically Signed   By: Rise Mu M.D.   On: 12/28/2013 02:29   Dg Chest Portable 1 View  12/28/2013   CLINICAL DATA:  Abdominal pain and headache  EXAM: PORTABLE CHEST - 1 VIEW  COMPARISON:  12/07/2013  FINDINGS: Cardiopericardial enlargement which is chronic. Diffuse interstitial coarsening. Kerley B-lines. Bilateral pleural effusions which are layering. No pneumothorax.  IMPRESSION: CHF, including pleural effusions.   Electronically Signed   By: Tiburcio Pea M.D.   On: 12/28/2013 00:59   Dg Chest Port 1 View  12/03/2013   CLINICAL DATA:  History of congestive failure  EXAM: PORTABLE CHEST - 1 VIEW  COMPARISON:  For 12/01/2013  FINDINGS: Cardiac shadow is stable. The left basilar infiltrate with associated effusion is now noted. Vascular congestion with pulmonary edema is also noted. No bony abnormality is seen.  IMPRESSION: Congestive failure with new left basilar infiltrate   Electronically Signed   By: Alcide Clever M.D.   On: 12/03/2013 07:23   US Abdomen Limited Ruq  12/28/2013   CLINICAL DATA:  Right upper quadrant pain  EXAM: US ABDOMEN LIMITED - RIGHT UPPER QUADRANT  COMPARISON:  None.  FINDINGS: Gallbladder  Few mid level echoes may represent sludge. No stones. No wall thickening or sonographic Murphy sign.  Common bile duct  Diameter: 2 mm  Liver:  No focal lesion identified. Within normal limits in parenchymal echogenicity.  Other:  Moderate right pleural effusion.  IMPRESSION: 1. Negative for cholelithiasis or acute cholecystitis. 2. Right pleural effusion.   Electronically Signed   By: Tiburcio Pea M.D.   On: 12/28/2013 02:13    Christiane Ha, MD  [facility] Pager:[phone]  If 7PM-7AM, please contact night-coverage [email or web address] Password [email or web address] 12/29/2013, 11:52 AM   LOS: 2 days

## 2013-12-29 NOTE — Telephone Encounter (Signed)
Tomorrow (saturday) at 11:00 AM

## 2013-12-29 NOTE — Telephone Encounter (Signed)
Updated Arlene regarding MD response.  States patient was finally put in a room after 3pm yesterday.  She is in room [address] [facility].  Dtr requests that IF you ARE able to go to the hospital, to PLEASE call & let her know when you are planning to go so that she can be there to speak with you.  States she can be reached at [phone].  States it will take her close to an hour to get to the hospital, but reiterates to PLEASE let her know if/when you are going to go.

## 2013-12-30 MED ORDER — FUROSEMIDE 40 MG PO TABS
40.0000 mg | ORAL_TABLET | Freq: Two times a day (BID) | ORAL | Status: DC
  Administered 2013-12-30 – 2014-01-02 (×6): 40 mg via ORAL
  Filled 2013-12-30 (×8): qty 1

## 2013-12-30 MED ORDER — PREDNISONE 10 MG PO TABS
10.0000 mg | ORAL_TABLET | Freq: Every day | ORAL | Status: DC
  Administered 2013-12-31: 10 mg via ORAL
  Filled 2013-12-30 (×2): qty 1

## 2013-12-30 NOTE — Progress Notes (Signed)
Patient BJ:YNWG:Ruth Roanna BanningS Faiola      DOB: [birth year]      NFA:[id number]:[id number]  Consult for GOC received.  Left voicemail to schedule time with Daughter.  Will likely not occur until tomorrow.  Will update asap.  [name], MD MBA The Palliative Medicine Team at [facility] Team Phone: [phone] Pager: [phone]

## 2013-12-30 NOTE — Progress Notes (Signed)
Courtesy Note - given [name]'s advanced age , weakness and recurrent episodes of heart failure she is a candidate for palliative care consult. I have spoken with [name] (daughter) who is ok with this: we talked about comfort care and not return to hospital.  Thank you for the good care she is getting.

## 2013-12-30 NOTE — Progress Notes (Signed)
Thank you for consulting the Palliative Medicine Team at [facility] to meet your patient's and family's needs.   The reason that you asked us to see your patient is  For GOC  We have scheduled your patient for a meeting: 1/12 at 11 am family request  The Surrogate decision make is: Ms. Lovell SheehanJenkins daughter Contact information: [phone]  Other family members that need to be present: at families discretion    Your patient is able/unable to participate  Additional Narrative:  Danila Eddie L. Ladona Ridgelaylor, MD MBA The Palliative Medicine Team at [facility] Team Phone: [phone] Pager: [phone]

## 2013-12-30 NOTE — Progress Notes (Addendum)
Chart reviewed.         PATIENT DETAILS Name: [name] Age: 80 y.o. Sex: female Date of Birth: 08/30/1915 Admit Date: 12/27/2013 Admitting Physician Dewayne Shorter Levora Dredge, MD [id number] Norins, MD  Subjective: Does not feel well, but has a hard time qualifying further. Denies shortness of breath. No chest pain. No cough. No fevers or chills. Abdominal pain resolved. Eating a bit.  Assessment/Plan: Principal Problem:   Acute respiratory failure with hypercapnia - Multifactorial- suspect secondary to narcotics given in the emergency room, with contributions from his underlying COPD, acute systolic heart failure. - Started on BiPAP, during rounds this morning, this was discontinued. Patient comfortable with just nasal cannula. - Avoid sedating medications as much as possible. - Will continue to treat COPD and CHF. Repeat chest x-ray shows continued heart failure, but clinically, patient much improved. Change to oral lasix. No wheeze today. Taper steroids quickly. Wean oxygen to room air as able.  Small subdural hematoma - Case was discussed by ED M.D. with neurosurgery (Dr. Wynetta Emery) does not recommend any further intervention. Avoid antiplatelets and anticoagulation at this point.    Acute on chronic systolic CHF (congestive heart failure) - Continue Lasix, but will decrease the dose to 40 milligrams IV twice daily  - Ejection fraction on 12/02/13 was 30% with diffuse hypokinesis. Unclear why patient is not on ACE inhibitor. Will start low dose and monitor.  ? COPD exacerbation - Did have hypercapnia on admission-not sure if this was secondary to medications or from COPD exacerbation. Apparently given one dose of Solu-Medrol on admission. Will continue with scheduled nebulized bronchodilators. Will start with a short course of oral prednisone.    UTI (lower urinary tract infection) - Recent history of ESBL Escherichia coli,  Urine culture shows multiple bacterial morphotypes. Initial UA  contaminated with many squamous epithelial cells. We'll repeat UA and if improved, discontinue antibiotics altogether. In the meantime, change to oral antibiotic  Bilateral pleural effusions - Suspect secondary to history of chronic systolic heart failure - No indication for thoracocentesis at present - Continue to monitor and diurese with Lasix.  Abdominal pain resolved  History of atrial fibrillation - Continue Cardizem. Not a anticoagulation candidate  Deconditioning - Ordered PT evaluation  Disposition: Physical therapy recommending skilled nursing facility. It appears patient was just discharged from one. Dr. Debby Bud has talked with family about hospice and has recommended a palliative medicine consult. Patient's daughter feels residential hospice would be best if appropriate. If not, then skilled nursing facility. Will consult palliative care medicine to assist with disposition and symptom management.  DVT Prophylaxis:  SCD's- given subdural hematoma  Code Status:  DNR  Family Communication Discussed with daughter, Mickeal Skinner telephone numbers [phone]  Procedures:  NONE  CONSULTS:  None  Time spent 40 minutes   MEDICATIONS: Scheduled Meds: . cefUROXime  500 mg Oral BID WC  . diltiazem  240 mg Oral Daily  . feeding supplement (ENSURE COMPLETE)  237 mL Oral BID BM  . furosemide  40 mg Intravenous BID  . lisinopril  2.5 mg Oral Daily  . pantoprazole  40 mg Oral Daily  . predniSONE  20 mg Oral Q breakfast  . sodium chloride  3 mL Intravenous Q12H   Continuous Infusions:   PRN Meds:.sodium chloride, acetaminophen, albuterol, fluticasone, metoprolol, ondansetron (ZOFRAN) IV, polyethylene glycol, sodium chloride  Antibiotics: Anti-infectives   Start     Dose/Rate Route Frequency Ordered Stop   12/29/13 1700  cefUROXime (CEFTIN) tablet 500 mg  500 mg Oral 2 times daily with meals 12/29/13 1215     12/28/13 0800  imipenem-cilastatin (PRIMAXIN) 250 mg in  sodium chloride 0.9 % 100 mL IVPB  Status:  Discontinued     250 mg 200 mL/hr over 30 Minutes Intravenous Every 8 hours 12/28/13 0101 12/29/13 1211   12/28/13 0115  imipenem-cilastatin (PRIMAXIN) 250 mg in sodium chloride 0.9 % 100 mL IVPB     250 mg 200 mL/hr over 30 Minutes Intravenous  Once 12/28/13 0101 12/28/13 0219       PHYSICAL EXAM: Vital signs in last 24 hours: Filed Vitals:   12/29/13 1712 12/29/13 2105 12/30/13 0537 12/30/13 1013  BP: 126/60 121/55 115/68 122/67  Pulse: 84 56 73   Temp:  97.6 F (36.4 C) 97.8 F (36.6 C)   TempSrc:  Oral Oral   Resp:  18 18   Height:      Weight:   62.999 kg (138 lb 14.2 oz)   SpO2: 93% 92% 93%     Weight change: -0.701 kg (-1 lb 8.7 oz) Filed Weights   12/28/13 0047 12/29/13 0508 12/30/13 0537  Weight: 61.8 kg (136 lb 3.9 oz) 63.7 kg (140 lb 6.9 oz) 62.999 kg (138 lb 14.2 oz)   Body mass index is 24.61 kg/(m^2).   Gen Exam: Awake and alert with clear speech.  But slightly lethargic Neck: Supple, No JVD.   Chest: Few bibasilar rales CVS: S1 S2 Regular, no murmurs.  Abdomen: soft, BS +, non tender, non distended.  Extremities: no edema, lower extremities warm to touch Neurologic: Non Focal.  But generalized weakness positive   Intake/Output from previous day:  Intake/Output Summary (Last 24 hours) at 12/30/13 1409 Last data filed at 12/30/13 1017  Gross per 24 hour  Intake      3 ml  Output   1200 ml  Net  -1197 ml     LAB RESULTS: CBC  Recent Labs Lab 12/27/13 2328 12/29/13 0536  WBC 6.9 8.9  HGB 14.6 12.4  HCT 43.3 37.4  PLT 360 292  MCV 86.8 87.8  MCH 29.3 29.1  MCHC 33.7 33.2  RDW 14.5 14.5  LYMPHSABS 2.1  --   MONOABS 0.5  --   EOSABS 0.2  --   BASOSABS 0.0  --     Chemistries   Recent Labs Lab 12/27/13 2328 12/29/13 0536  NA 134* 135*  K 4.1 4.1  CL 92* 97  CO2 26 25  GLUCOSE 150* 152*  BUN 31* 38*  CREATININE 1.20* 1.22*  CALCIUM 9.4 8.4    CBG: No results found for this  basename: GLUCAP,  in the last 168 hours  GFR Estimated Creatinine Clearance: 24.1 ml/min (by C-G formula based on Cr of 1.22).  Coagulation profile  Recent Labs Lab 12/28/13 0232  INR 1.02    Cardiac Enzymes  Recent Labs Lab 12/28/13 0348 12/28/13 0935 12/28/13 1515  TROPONINI <0.30 <0.30 <0.30    No components found with this basename: POCBNP,  No results found for this basename: DDIMER,  in the last 72 hours No results found for this basename: HGBA1C,  in the last 72 hours No results found for this basename: CHOL, HDL, LDLCALC, TRIG, CHOLHDL, LDLDIRECT,  in the last 72 hours No results found for this basename: TSH, T4TOTAL, FREET3, T3FREE, THYROIDAB,  in the last 72 hours No results found for this basename: VITAMINB12, FOLATE, FERRITIN, TIBC, IRON, RETICCTPCT,  in the last 72 hours  Recent Labs  12/27/13  2328  LIPASE 38    Urine Studies No results found for this basename: UACOL, UAPR, USPG, UPH, UTP, UGL, UKET, UBIL, UHGB, UNIT, UROB, ULEU, UEPI, UWBC, URBC, UBAC, CAST, CRYS, UCOM, BILUA,  in the last 72 hours  MICROBIOLOGY: Recent Results (from the past 240 hour(s))  URINE CULTURE     Status: None   Collection Time    12/28/13 12:18 AM      Result Value Range Status   Specimen Description URINE, RANDOM   Final   Special Requests NONE   Final   Culture  Setup Time     Final   Value: 12/28/2013 09:27     Performed at [facility] Count     Final   Value: >=100,000 COLONIES/ML     Performed at [facility]   Culture     Final   Value: Multiple bacterial morphotypes present, none predominant. Suggest appropriate recollection if clinically indicated.     Performed at [facility]   Report Status 12/29/2013 FINAL   Final    RADIOLOGY STUDIES/RESULTS: Ct Abdomen Pelvis Wo Contrast  12/28/2013   CLINICAL DATA:  Abdominal pain and headache.  EXAM: CT ABDOMEN AND PELVIS WITHOUT CONTRAST  TECHNIQUE: Multidetector CT imaging of the  abdomen and pelvis was performed following the standard protocol without intravenous contrast.  COMPARISON:  None.  FINDINGS: BODY WALL: Unremarkable.  LOWER CHEST: Cardiomegaly with coronary artery atherosclerosis and mitral annular calcification. There are moderate, layering bilateral pleural effusions and bibasilar atelectasis.  ABDOMEN/PELVIS:  Liver: No focal abnormality.  Biliary: No evidence of biliary obstruction or stone.  Pancreas: Unremarkable.  Spleen: Granulomatous changes.  Adrenals: Unremarkable.  Kidneys and ureters: No hydronephrosis or stone.  Bladder: Unremarkable.  Reproductive: Unremarkable.  Bowel: No bowel obstruction. Extensive distal colonic diverticulosis. Normal appendix.  Retroperitoneum: No mass or adenopathy.  Peritoneum: No free fluid or gas.  Vascular: Extensive aortic and branch vessel atherosclerosis.  OSSEOUS: Located right hip hemiarthroplasty. L1 and L2 compression fractures which are remote. The L2 compression has progressed since 03/22/2012. No acute fracture seen. Grade 1 L5-S1 anterolisthesis with advanced facet osteoarthritis.  IMPRESSION: 1. As permitted by respiratory motion, no acute intra-abdominal findings. 2. Moderate bilateral pleural effusions with atelectasis.   Electronically Signed   By: Tiburcio Pea M.D.   On: 12/28/2013 02:58   Dg Chest 2 View  12/07/2013   CLINICAL DATA:  Followup pneumonia  EXAM: CHEST  2 VIEW  COMPARISON:  12/03/2013  FINDINGS: Cardiac shadow remains enlarged. There has been significant improvement in degree of vascular congestion. Minimal atelectasis is noted in the left lung base. No other focal abnormality is seen.  IMPRESSION: Significant improvement in vascular congestion and left basilar infiltrate. Minimal residual changes in the left base are noted.   Electronically Signed   By: Alcide Clever M.D.   On: 12/07/2013 07:58   X-ray Chest Pa And Lateral   12/01/2013   CLINICAL DATA:  Shortness of breath, weakness, history  coronary artery disease post MI, CHF, hypertension, COPD  EXAM: CHEST  2 VIEW  COMPARISON:  07/04/2013  FINDINGS: Enlargement of cardiac silhouette with pulmonary vascular congestion.  Scattered interstitial prominence greater on left likely representing mildly asymmetric pulmonary edema and CHF.  Atherosclerotic calcification aorta.  No segmental consolidation or pneumothorax.  Dependent small posterior pleural effusions bilaterally.  Skin folds project over right lung on the PA view.  Diffuse osseous demineralization.  IMPRESSION: Enlargement of cardiac silhouette with pulmonary  vascular congestion.  Mild CHF with small dependent pleural effusions bilaterally.   Electronically Signed   By: Ulyses Southward M.D.   On: 12/01/2013 17:23   Ct Head Wo Contrast  12/28/2013   CLINICAL DATA:  Headache  EXAM: CT HEAD WITHOUT CONTRAST  TECHNIQUE: Contiguous axial images were obtained from the base of the skull through the vertex without intravenous contrast.  COMPARISON:  Prior CT from 02/15/2008  FINDINGS: Diffuse age-related cerebral atrophy with chronic microvascular ischemic disease is present. Findings are advanced as compared to most recent CT examination from 02/15/2008.  Right parafalcine hyperdensity measuring 1.7 x 1.3 cm is seen along the anterior falx (series 2, image 19). Second focus of hyperdensity seen within the right parafalcine region is located slightly more cephalad and measures 1.6 by is 0.7 cm. Findings are most consistent with acute/ subacute subdural hemorrhage. There is no significant mass effect about these bleeds. No midline shift or hydrocephalus. No other intracranial hemorrhage.  The calvarium is intact. Orbits are normal. Paranasal sinuses and mastoid air cells are clear.  IMPRESSION: 1. Acute/early subacute right parafalcine subdural hemorrhage as above. No significant mass effect, midline shift, or hydrocephalus. 2. Advanced age-related atrophy with chronic microvascular ischemic disease,  progressed as compared to 2009. Critical Value/emergent results were called by telephone at the time of interpretation on 12/28/2013 at 2:26 AM to Dr. Glynn Octave , who verbally acknowledged these results.   Electronically Signed   By: Rise Mu M.D.   On: 12/28/2013 02:29   Dg Chest Portable 1 View  12/28/2013   CLINICAL DATA:  Abdominal pain and headache  EXAM: PORTABLE CHEST - 1 VIEW  COMPARISON:  12/07/2013  FINDINGS: Cardiopericardial enlargement which is chronic. Diffuse interstitial coarsening. Kerley B-lines. Bilateral pleural effusions which are layering. No pneumothorax.  IMPRESSION: CHF, including pleural effusions.   Electronically Signed   By: Tiburcio Pea M.D.   On: 12/28/2013 00:59   Dg Chest Port 1 View  12/03/2013   CLINICAL DATA:  History of congestive failure  EXAM: PORTABLE CHEST - 1 VIEW  COMPARISON:  For 12/01/2013  FINDINGS: Cardiac shadow is stable. The left basilar infiltrate with associated effusion is now noted. Vascular congestion with pulmonary edema is also noted. No bony abnormality is seen.  IMPRESSION: Congestive failure with new left basilar infiltrate   Electronically Signed   By: Alcide Clever M.D.   On: 12/03/2013 07:23   US Abdomen Limited Ruq  12/28/2013   CLINICAL DATA:  Right upper quadrant pain  EXAM: US ABDOMEN LIMITED - RIGHT UPPER QUADRANT  COMPARISON:  None.  FINDINGS: Gallbladder  Few mid level echoes may represent sludge. No stones. No wall thickening or sonographic Murphy sign.  Common bile duct  Diameter: 2 mm  Liver:  No focal lesion identified. Within normal limits in parenchymal echogenicity.  Other:  Moderate right pleural effusion.  IMPRESSION: 1. Negative for cholelithiasis or acute cholecystitis. 2. Right pleural effusion.   Electronically Signed   By: Tiburcio Pea M.D.   On: 12/28/2013 02:13    Christiane Ha, MD  [facility] Pager:[phone]  If 7PM-7AM, please contact night-coverage [email or web address] Password  [email or web address] 12/30/2013, 2:09 PM   LOS: 3 days

## 2013-12-31 DIAGNOSIS — R5381 Other malaise: Secondary | ICD-10-CM

## 2013-12-31 NOTE — Clinical Social Work Note (Signed)
Clinical Social Work Department BRIEF PSYCHOSOCIAL ASSESSMENT 12/31/2013  Patient:  [name], [name]     Account Number:  [id number]     Admit date:  12/27/2013  Clinical Social Worker:  Lovey Newcomer  Date/Time:  12/31/2013 03:56 PM  Referred by:  Physician  Date Referred:  12/31/2013 Referred for  SNF Placement   Other Referral:   Interview type:  Patient Other interview type:   Patient alert and oriented at time of assessment.    PSYCHOSOCIAL DATA Living Status:  FAMILY Admitted from facility:   Level of care:   Primary support name:  Nida Boatman. Primary support relationship to patient:  FAMILY Degree of support available:   Support is adequate. Patient states that she lives with ther grandson.    CURRENT CONCERNS Current Concerns  Post-Acute Placement   Other Concerns:    SOCIAL WORK ASSESSMENT / PLAN CSW met with patient at bedside to discuss recommendation for SNF placement. Patient states that daughter and PMT will meet tomorrow and that she will be able to talk more about her discharge plans. Patient does state that she is agreeable to doing a SNF search in [facility], but states that she doesn't want to have to do a lot of physical activity. Patient states that she did have a past stay at a SNF and did not like it, but she cannot remember the name of the facility. Patient livees with grandson "about a mile from [place]". Patient would like to return there once she is able. Patient states that she is usually home alone most of the day but her grandson is home with her in the evenings. Patient states she can get around with her walker, but as she puts it she's "not that good at it." CSW sensed apprehension about going to SNF. CSW will follow up.   Assessment/plan status:  Psychosocial Support/Ongoing Assessment of Needs Other assessment/ plan:   Complete FL2, Fax, PASRR   Information/referral to community resources:   SNF list given to patient.     PATIENT'S/FAMILY'S RESPONSE TO PLAN OF CARE: Patient agreeable to SNF search. Patient was pleasant, appropriate, and appreciative of CSW contact. Patient will wait for bed offers. CSW will assist with DC.       Liz Beach, [place], [place], [phone]

## 2013-12-31 NOTE — Progress Notes (Signed)
PATIENT DETAILS Name: [name] Age: 80 y.o. Sex: female Date of Birth: 08-05-1915 Admit Date: 12/27/2013 Admitting Physician Dewayne Shorter Levora Dredge, MD ZOX:[id number] Norins, MD  Subjective: Does not want foley removed despite GU burning. Denies dyspnea. agreeble to placement as long as she is not "left with a big bill".  Agreeable to comfort measures, no rehospitalization after discharge.  Assessment/Plan: Principal Problem:   Acute respiratory failure with hypercapnia - Multifactorial- suspect secondary to narcotics given in the emergency room, with contributions from his underlying COPD, acute systolic heart failure. - Started on BiPAP,, this was discontinued. Patient comfortable with just nasal cannula. - Avoid sedating medications as much as possible. Much improved  Small subdural hematoma - Case was discussed by ED M.D. with neurosurgery (Dr. Wynetta Emery) does not recommend any further intervention. Avoid antiplatelets and anticoagulation at this point.    Acute on chronic systolic CHF (congestive heart failure) euvolemic  ? COPD exacerbation No wheeze currently    UTI (lower urinary tract infection) - Recent history of ESBL Escherichia coli,  Urine culture shows multiple bacterial morphotypes. Initial UA contaminated with many squamous epithelial cells. We'll repeat UA and if improved, discontinue antibiotics altogether. In the meantime, change to oral antibiotic  Bilateral pleural effusions - Suspect secondary to history of chronic systolic heart failure - No indication for thoracocentesis at present - Continue to monitor and diurese with Lasix.  Abdominal pain resolved  History of atrial fibrillation - Continue Cardizem. Not a anticoagulation candidate  Deconditioning - Ordered PT evaluation  Disposition: Await palliative care recs. SNF v. Residential hospice  DVT Prophylaxis:  SCD's- given subdural hematoma  Code Status:  DNR  Family  Communication Discussed with daughter, Mickeal Skinner telephone numbers [phone] on 1/10  Procedures:  NONE  CONSULTS:  None  Time spent 25 minutes   MEDICATIONS: Scheduled Meds: . cefUROXime  500 mg Oral BID WC  . diltiazem  240 mg Oral Daily  . feeding supplement (ENSURE COMPLETE)  237 mL Oral BID BM  . furosemide  40 mg Oral BID  . lisinopril  2.5 mg Oral Daily  . pantoprazole  40 mg Oral Daily  . predniSONE  10 mg Oral Q breakfast  . sodium chloride  3 mL Intravenous Q12H   Continuous Infusions:   PRN Meds:.sodium chloride, acetaminophen, albuterol, fluticasone, metoprolol, ondansetron (ZOFRAN) IV, polyethylene glycol, sodium chloride  Antibiotics: Anti-infectives   Start     Dose/Rate Route Frequency Ordered Stop   12/29/13 1700  cefUROXime (CEFTIN) tablet 500 mg     500 mg Oral 2 times daily with meals 12/29/13 1215     12/28/13 0800  imipenem-cilastatin (PRIMAXIN) 250 mg in sodium chloride 0.9 % 100 mL IVPB  Status:  Discontinued     250 mg 200 mL/hr over 30 Minutes Intravenous Every 8 hours 12/28/13 0101 12/29/13 1211   12/28/13 0115  imipenem-cilastatin (PRIMAXIN) 250 mg in sodium chloride 0.9 % 100 mL IVPB     250 mg 200 mL/hr over 30 Minutes Intravenous  Once 12/28/13 0101 12/28/13 0219       PHYSICAL EXAM: Vital signs in last 24 hours: Filed Vitals:   12/30/13 1500 12/30/13 2100 12/31/13 0500 12/31/13 1351  BP: 146/67 150/62 149/74 117/54  Pulse: 84 86 76 85  Temp: 97.8 F (36.6 C) 97.9 F (36.6 C) 97.6 F (36.4 C) 98 F (36.7 C)  TempSrc: Oral   Oral  Resp: 20 18 16 18   Height:      Weight:  62.3 kg (137 lb 5.6 oz)   SpO2: 98% 97% 97% 96%    Weight change: -0.699 kg (-1 lb 8.7 oz) Filed Weights   12/29/13 0508 12/30/13 0537 12/31/13 0500  Weight: 63.7 kg (140 lb 6.9 oz) 62.999 kg (138 lb 14.2 oz) 62.3 kg (137 lb 5.6 oz)   Body mass index is 24.34 kg/(m^2).   Gen Exam: a and o, appropriate Neck: Supple, No JVD.   Chest: CTA without  WRR CVS: S1 S2 Regular, no murmurs.  Abdomen: soft, BS +, non tender, non distended.  Extremities: no edema, lower extremities warm to touch Neurologic: Non Focal.  But generalized weakness positive   Intake/Output from previous day:  Intake/Output Summary (Last 24 hours) at 12/31/13 1555 Last data filed at 12/31/13 1014  Gross per 24 hour  Intake    123 ml  Output   2400 ml  Net  -2277 ml     LAB RESULTS: CBC  Recent Labs Lab 12/27/13 2328 12/29/13 0536  WBC 6.9 8.9  HGB 14.6 12.4  HCT 43.3 37.4  PLT 360 292  MCV 86.8 87.8  MCH 29.3 29.1  MCHC 33.7 33.2  RDW 14.5 14.5  LYMPHSABS 2.1  --   MONOABS 0.5  --   EOSABS 0.2  --   BASOSABS 0.0  --     Chemistries   Recent Labs Lab 12/27/13 2328 12/29/13 0536  NA 134* 135*  K 4.1 4.1  CL 92* 97  CO2 26 25  GLUCOSE 150* 152*  BUN 31* 38*  CREATININE 1.20* 1.22*  CALCIUM 9.4 8.4    CBG: No results found for this basename: GLUCAP,  in the last 168 hours  GFR Estimated Creatinine Clearance: 22.3 ml/min (by C-G formula based on Cr of 1.22).  Coagulation profile  Recent Labs Lab 12/28/13 0232  INR 1.02    Cardiac Enzymes  Recent Labs Lab 12/28/13 0348 12/28/13 0935 12/28/13 1515  TROPONINI <0.30 <0.30 <0.30    No components found with this basename: POCBNP,  No results found for this basename: DDIMER,  in the last 72 hours No results found for this basename: HGBA1C,  in the last 72 hours No results found for this basename: CHOL, HDL, LDLCALC, TRIG, CHOLHDL, LDLDIRECT,  in the last 72 hours No results found for this basename: TSH, T4TOTAL, FREET3, T3FREE, THYROIDAB,  in the last 72 hours No results found for this basename: VITAMINB12, FOLATE, FERRITIN, TIBC, IRON, RETICCTPCT,  in the last 72 hours No results found for this basename: LIPASE, AMYLASE,  in the last 72 hours  Urine Studies No results found for this basename: UACOL, UAPR, USPG, UPH, UTP, UGL, UKET, UBIL, UHGB, UNIT, UROB, ULEU, UEPI,  UWBC, URBC, UBAC, CAST, CRYS, UCOM, BILUA,  in the last 72 hours  MICROBIOLOGY: Recent Results (from the past 240 hour(s))  URINE CULTURE     Status: None   Collection Time    12/28/13 12:18 AM      Result Value Range Status   Specimen Description URINE, RANDOM   Final   Special Requests NONE   Final   Culture  Setup Time     Final   Value: 12/28/2013 09:27     Performed at [facility] Count     Final   Value: >=100,000 COLONIES/ML     Performed at [facility]   Culture     Final   Value: Multiple bacterial morphotypes present, none predominant. Suggest appropriate recollection if  clinically indicated.     Performed at [facility]   Report Status 12/29/2013 FINAL   Final    RADIOLOGY STUDIES/RESULTS: Ct Abdomen Pelvis Wo Contrast  12/28/2013   CLINICAL DATA:  Abdominal pain and headache.  EXAM: CT ABDOMEN AND PELVIS WITHOUT CONTRAST  TECHNIQUE: Multidetector CT imaging of the abdomen and pelvis was performed following the standard protocol without intravenous contrast.  COMPARISON:  None.  FINDINGS: BODY WALL: Unremarkable.  LOWER CHEST: Cardiomegaly with coronary artery atherosclerosis and mitral annular calcification. There are moderate, layering bilateral pleural effusions and bibasilar atelectasis.  ABDOMEN/PELVIS:  Liver: No focal abnormality.  Biliary: No evidence of biliary obstruction or stone.  Pancreas: Unremarkable.  Spleen: Granulomatous changes.  Adrenals: Unremarkable.  Kidneys and ureters: No hydronephrosis or stone.  Bladder: Unremarkable.  Reproductive: Unremarkable.  Bowel: No bowel obstruction. Extensive distal colonic diverticulosis. Normal appendix.  Retroperitoneum: No mass or adenopathy.  Peritoneum: No free fluid or gas.  Vascular: Extensive aortic and branch vessel atherosclerosis.  OSSEOUS: Located right hip hemiarthroplasty. L1 and L2 compression fractures which are remote. The L2 compression has progressed since 03/22/2012. No  acute fracture seen. Grade 1 L5-S1 anterolisthesis with advanced facet osteoarthritis.  IMPRESSION: 1. As permitted by respiratory motion, no acute intra-abdominal findings. 2. Moderate bilateral pleural effusions with atelectasis.   Electronically Signed   By: Tiburcio Pea M.D.   On: 12/28/2013 02:58   Dg Chest 2 View  12/07/2013   CLINICAL DATA:  Followup pneumonia  EXAM: CHEST  2 VIEW  COMPARISON:  12/03/2013  FINDINGS: Cardiac shadow remains enlarged. There has been significant improvement in degree of vascular congestion. Minimal atelectasis is noted in the left lung base. No other focal abnormality is seen.  IMPRESSION: Significant improvement in vascular congestion and left basilar infiltrate. Minimal residual changes in the left base are noted.   Electronically Signed   By: Alcide Clever M.D.   On: 12/07/2013 07:58   X-ray Chest Pa And Lateral   12/01/2013   CLINICAL DATA:  Shortness of breath, weakness, history coronary artery disease post MI, CHF, hypertension, COPD  EXAM: CHEST  2 VIEW  COMPARISON:  07/04/2013  FINDINGS: Enlargement of cardiac silhouette with pulmonary vascular congestion.  Scattered interstitial prominence greater on left likely representing mildly asymmetric pulmonary edema and CHF.  Atherosclerotic calcification aorta.  No segmental consolidation or pneumothorax.  Dependent small posterior pleural effusions bilaterally.  Skin folds project over right lung on the PA view.  Diffuse osseous demineralization.  IMPRESSION: Enlargement of cardiac silhouette with pulmonary vascular congestion.  Mild CHF with small dependent pleural effusions bilaterally.   Electronically Signed   By: Ulyses Southward M.D.   On: 12/01/2013 17:23   Ct Head Wo Contrast  12/28/2013   CLINICAL DATA:  Headache  EXAM: CT HEAD WITHOUT CONTRAST  TECHNIQUE: Contiguous axial images were obtained from the base of the skull through the vertex without intravenous contrast.  COMPARISON:  Prior CT from 02/15/2008   FINDINGS: Diffuse age-related cerebral atrophy with chronic microvascular ischemic disease is present. Findings are advanced as compared to most recent CT examination from 02/15/2008.  Right parafalcine hyperdensity measuring 1.7 x 1.3 cm is seen along the anterior falx (series 2, image 19). Second focus of hyperdensity seen within the right parafalcine region is located slightly more cephalad and measures 1.6 by is 0.7 cm. Findings are most consistent with acute/ subacute subdural hemorrhage. There is no significant mass effect about these bleeds. No midline shift or hydrocephalus. No other  intracranial hemorrhage.  The calvarium is intact. Orbits are normal. Paranasal sinuses and mastoid air cells are clear.  IMPRESSION: 1. Acute/early subacute right parafalcine subdural hemorrhage as above. No significant mass effect, midline shift, or hydrocephalus. 2. Advanced age-related atrophy with chronic microvascular ischemic disease, progressed as compared to 2009. Critical Value/emergent results were called by telephone at the time of interpretation on 12/28/2013 at 2:26 AM to Dr. Glynn Octave , who verbally acknowledged these results.   Electronically Signed   By: Rise Mu M.D.   On: 12/28/2013 02:29   Dg Chest Portable 1 View  12/28/2013   CLINICAL DATA:  Abdominal pain and headache  EXAM: PORTABLE CHEST - 1 VIEW  COMPARISON:  12/07/2013  FINDINGS: Cardiopericardial enlargement which is chronic. Diffuse interstitial coarsening. Kerley B-lines. Bilateral pleural effusions which are layering. No pneumothorax.  IMPRESSION: CHF, including pleural effusions.   Electronically Signed   By: Tiburcio Pea M.D.   On: 12/28/2013 00:59   Dg Chest Port 1 View  12/03/2013   CLINICAL DATA:  History of congestive failure  EXAM: PORTABLE CHEST - 1 VIEW  COMPARISON:  For 12/01/2013  FINDINGS: Cardiac shadow is stable. The left basilar infiltrate with associated effusion is now noted. Vascular congestion with  pulmonary edema is also noted. No bony abnormality is seen.  IMPRESSION: Congestive failure with new left basilar infiltrate   Electronically Signed   By: Alcide Clever M.D.   On: 12/03/2013 07:23   US Abdomen Limited Ruq  12/28/2013   CLINICAL DATA:  Right upper quadrant pain  EXAM: US ABDOMEN LIMITED - RIGHT UPPER QUADRANT  COMPARISON:  None.  FINDINGS: Gallbladder  Few mid level echoes may represent sludge. No stones. No wall thickening or sonographic Murphy sign.  Common bile duct  Diameter: 2 mm  Liver:  No focal lesion identified. Within normal limits in parenchymal echogenicity.  Other:  Moderate right pleural effusion.  IMPRESSION: 1. Negative for cholelithiasis or acute cholecystitis. 2. Right pleural effusion.   Electronically Signed   By: Tiburcio Pea M.D.   On: 12/28/2013 02:13    Christiane Ha, MD  [facility] Pager:[phone]  If 7PM-7AM, please contact night-coverage [email or web address] Password [email or web address] 12/31/2013, 3:55 PM   LOS: 4 days

## 2013-12-31 NOTE — Clinical Social Work Note (Addendum)
Clinical Social Work Department CLINICAL SOCIAL WORK PLACEMENT NOTE 12/31/2013  Patient:  [name],[name]  Account Number:  [id number] Admit date:  12/27/2013  Clinical Social Worker:  Cherre BlancJOSEPH BRYANT CAMPBELL, [place]  Date/time:  12/31/2013 04:24 PM  Clinical Social Work is seeking post-discharge placement for this patient at the following level of care:   SKILLED NURSING   (*CSW will update this form in Epic as items are completed)   12/31/2013  [facility]family [facility] with [facility] Department of Clinical Social Work'[name] list of facilities offering this level of care within the geographic area requested by the patient (or if unable, by the patient'[name] family).  12/31/2013  Patient/family informed of their freedom to choose among providers that offer the needed level of care, that participate in Medicare, Medicaid or managed care program needed by the patient, have an available bed and are willing to accept the patient.  12/31/2013  Patient/family informed of [facility]' ownership interest in [facility], as well as of the fact that they are under no obligation to receive care at this facility.  PASARR submitted to EDS on  PASARR number received from EDS on   FL2 transmitted to all facilities in geographic area requested by pt/family on  12/31/2013 FL2 transmitted to all facilities within larger geographic area on   Patient informed that his/her managed care company has contracts with or will negotiate with  certain facilities, including the following:     Patient/family informed of bed offers received:  [facility]2014 [facility] and patient chooses bed at    Patient to be transferred to  on   Patient to be transferred to facility by   The following physician request were entered in Epic:   Additional Comments:   Roddie McBryant Campbell, Bryon LionsLCSWA, LCASA, [phone]

## 2014-01-01 DIAGNOSIS — Z515 Encounter for palliative care: Secondary | ICD-10-CM

## 2014-01-01 DIAGNOSIS — I4891 Unspecified atrial fibrillation: Secondary | ICD-10-CM

## 2014-01-01 NOTE — Consult Note (Addendum)
Patient DP:OEUM JAMYIAH LABELLA      DOB: 13-Nov-1915      PNT:[id number]     Consult Note from the Palliative Medicine Team at [facility] Requested by:  [place]    PCP: Adella Hare, MD Reason for Consultation: [place]    Phone Number:[phone]  Assessment of patients Current state: 80 yr old white female with a known history of CHF, COPD.  She presented with shortness of breath, abdomen discomfort .  Pt complained of headache and CT scan revealed an acute/subacute subdural hematoma.  Met with patient's daughter, and grandson.  Grandson fixated on the fact that he thinks that patient is given opiates for her symptoms which then puts her in CHF.  Attempted to explain the cycle of CHF and COPD together.  They are skeptical at best.  We reviewed the Hospice philosphy offered by Dr. Crissie Sickles to the patient's daughter.  She is not sure they are at the point of converting to comfort care even if she does believe that their is potential for her mother to die in the next 6 months.  I have given the the MOST form to review with Dr. Crissie Sickles when they fee ready to make choices.   Goals of Care: 1.  Code Status: DNR   2. Scope of Treatment: Continue current curative treatments to stabilize her condition  4. Disposition: SNF of their choice   3. Symptom Management: 1.  CHF more stable, continue to treat 2. COPD compensated continue to treat for cure 3.  Foley catheter: patient states she is not going to let them take it out because she doesn't want to run to the hospital.   4. Psychosocial: worked for [employer] in [employer]  5. Spiritual:        Patient Documents Completed or Given: Document Given Completed  Advanced Directives Pkt    MOST    DNR    Gone from My [facility]    Hard Choices      Brief HPI: 80 yr old white female admitted with CHF exacerbation, copd, and possible uti.  Patient found to have acute/subacute subdural   ROS: no complaints other than   PMH:  Past Medical  History  Diagnosis Date  . CAD (coronary artery disease)   . Hyperlipidemia   . Osteoporosis   . PVD (peripheral vascular disease)   . HTN (hypertension)   . Diverticulosis of colon   . GERD (gastroesophageal reflux disease)   . Hip fracture, right   . Decubitus ulcer     Right heel  . COPD (chronic obstructive pulmonary disease)   . Anxiety   . Anemia   . Esophageal stricture   . CHF (congestive heart failure)   . Atrial fibrillation   . Fibromuscular dysplasia     Renal artery  . Myocardial infarction   . Shortness of breath      PSH: Past Surgical History  Procedure Laterality Date  . Ptca  04/2003  . Cataract extraction      Bilateral  . Orif acetabular fracture  2009   I have reviewed the FH and SH and  If appropriate update it with new information. Allergies  Allergen Reactions  . Morphine And Related Shortness Of Breath    Family reports altered mental status with respiratory distress when given fentanyl and morphine products.  . Codeine Phosphate     Makes her hurt  . Doxycycline Other (See Comments)    unknown  . Erythromycin  Other (See Comments)    unknown   Scheduled Meds: . diltiazem  240 mg Oral Daily  . feeding supplement (ENSURE COMPLETE)  237 mL Oral BID BM  . furosemide  40 mg Oral BID  . lisinopril  2.5 mg Oral Daily  . pantoprazole  40 mg Oral Daily  . sodium chloride  3 mL Intravenous Q12H   Continuous Infusions:  PRN Meds:.sodium chloride, acetaminophen, albuterol, fluticasone, ondansetron (ZOFRAN) IV, polyethylene glycol, sodium chloride    BP 130/73  Pulse 85  Temp(Src) 98.2 F (36.8 C) (Oral)  Resp 16  Ht '5\' 3"'  (1.6 m)  Wt 62.3 kg (137 lb 5.6 oz)  BMI 24.34 kg/m2  SpO2 98%   PPS:40%   Intake/Output Summary (Last 24 hours) at 01/01/14 1316 Last data filed at 01/01/14 0835  Gross per 24 hour  Intake    480 ml  Output   2875 ml  Net  -2395 ml    Physical Exam:  General: Awake alert oriented to person , place and  general events.  Able to direct her own needs HEENT:  PERRL, EOMI, anicteric. MMM  Chest:   Decreased with some fine basilar crackle CVS: distant , regular S1, S2 Abdomen:soft, not tender Ext: warm, no lesions, no edema Neuro:awake , alert and oriented  Labs: CBC    Component Value Date/Time   WBC 8.9 12/29/2013 0536   RBC 4.26 12/29/2013 0536   HGB 12.4 12/29/2013 0536   HCT 37.4 12/29/2013 0536   PLT 292 12/29/2013 0536   MCV 87.8 12/29/2013 0536   MCH 29.1 12/29/2013 0536   MCHC 33.2 12/29/2013 0536   RDW 14.5 12/29/2013 0536   LYMPHSABS 2.1 12/27/2013 2328   MONOABS 0.5 12/27/2013 2328   EOSABS 0.2 12/27/2013 2328   BASOSABS 0.0 12/27/2013 2328       CMP     Component Value Date/Time   NA 135* 12/29/2013 0536   K 4.1 12/29/2013 0536   CL 97 12/29/2013 0536   CO2 25 12/29/2013 0536   GLUCOSE 152* 12/29/2013 0536   BUN 38* 12/29/2013 0536   CREATININE 1.22* 12/29/2013 0536   CALCIUM 8.4 12/29/2013 0536   PROT 6.8 12/27/2013 2328   ALBUMIN 3.1* 12/27/2013 2328   AST 40* 12/27/2013 2328   ALT 46* 12/27/2013 2328   ALKPHOS 139* 12/27/2013 2328   BILITOT 0.4 12/27/2013 2328   GFRNONAA 36* 12/29/2013 0536   GFRAA 42* 12/29/2013 0536    Chest Xray Reviewed/Impressions: chf  CT scan of the Head Reviewed/Impressions:1. Acute/early subacute right parafalcine subdural hemorrhage as  above. No significant mass effect, midline shift, or hydrocephalus.  2. Advanced age-related atrophy with chronic microvascular ischemic  disease, progressed as compared to 2009.     Time In Time Out Total Time Spent with Patient Total Overall Time  1115 am  1215 am 20 min 60 min   I have included opiates on the patient's adverse allergies due to families report of breathing difficulties when given.  Yolanda Bonine was shown the change to the chart with the permission of his grand mother   Greater than 50%  of this time was spent counseling and coordinating care related to the above assessment and plan.  Updated Dr. Orpah Clinton L.  Lovena Le, MD MBA The Palliative Medicine Team at [facility] Phone: [phone] Pager: [phone]'

## 2014-01-01 NOTE — Progress Notes (Signed)
CSW (Clinical Child psychotherapistocial Worker) gave pt and pt daughter bed offers. [place] and unaware of where facilities are located. Preference would be for a facility closer to her. CSW showed pt daughter map with facility locations. Pt and pt family agreeable to [facility]. CSW explained insurance authorization process to family and informed that likely will not be able to get authorization by today. Pt and pt family very much okay with this as they would prefer dc tomorrow. CSW has informed facility and asked to please start precert for SNF.  Ayari Liwanag, LCSWA [phone]

## 2014-01-01 NOTE — Progress Notes (Signed)
PATIENT DETAILS Name: [name] Age: 80 y.o. Sex: female Date of Birth: [birth year] Admit Date: 12/27/2013 Admitting Physician Dewayne Shorter Levora Dredge, MD [id number] Norins, MD  Subjective: No complaints  Assessment/Plan: Principal Problem:   Acute respiratory failure with hypercapnia - Multifactorial- suspect secondary to narcotics given in the emergency room, with contributions from his underlying COPD, acute systolic heart failure. - Started on BiPAP,, this was discontinued. Patient comfortable with just nasal cannula. Much improved  Small subdural hematoma - Case was discussed by ED M.D. with neurosurgery (Dr. Wynetta Emery) does not recommend any further intervention. Avoid antiplatelets and anticoagulation at this point.    Acute on chronic systolic CHF (congestive heart failure) euvolemic  ? COPD exacerbation No wheeze currently    UTI (lower urinary tract infection) - Recent history of ESBL Escherichia coli,  Urine culture shows multiple bacterial morphotypes. Initial UA contaminated with many squamous epithelial cells. Repeat UA improved. Last day abx today.  Bilateral pleural effusions - Suspect secondary to history of chronic systolic heart failure - No indication for thoracocentesis at present - Continue to monitor and diurese with Lasix.  Abdominal pain resolved  History of atrial fibrillation - Continue Cardizem. Not a anticoagulation candidate  Deconditioning - Ordered PT evaluation  Disposition: SNF tomorrow. Palliative care note pended  DVT Prophylaxis:  SCD's- given subdural hematoma  Code Status:  DNR  Family Communication Discussed with daughter, Mickeal Skinner telephone numbers [phone] on 1/10  Procedures:  NONE  CONSULTS: Palliative care  Time spent 25 minutes   MEDICATIONS: Scheduled Meds: . diltiazem  240 mg Oral Daily  . feeding supplement (ENSURE COMPLETE)  237 mL Oral BID BM  . furosemide  40 mg Oral BID  . lisinopril   2.5 mg Oral Daily  . pantoprazole  40 mg Oral Daily  . sodium chloride  3 mL Intravenous Q12H   Continuous Infusions:   PRN Meds:.sodium chloride, acetaminophen, albuterol, fluticasone, ondansetron (ZOFRAN) IV, polyethylene glycol, sodium chloride  Antibiotics: Anti-infectives   Start     Dose/Rate Route Frequency Ordered Stop   12/29/13 1700  cefUROXime (CEFTIN) tablet 500 mg     500 mg Oral 2 times daily with meals 12/29/13 1215 12/31/13 1610   12/28/13 0800  imipenem-cilastatin (PRIMAXIN) 250 mg in sodium chloride 0.9 % 100 mL IVPB  Status:  Discontinued     250 mg 200 mL/hr over 30 Minutes Intravenous Every 8 hours 12/28/13 0101 12/29/13 1211   12/28/13 0115  imipenem-cilastatin (PRIMAXIN) 250 mg in sodium chloride 0.9 % 100 mL IVPB     250 mg 200 mL/hr over 30 Minutes Intravenous  Once 12/28/13 0101 12/28/13 0219       PHYSICAL EXAM: Vital signs in last 24 hours: Filed Vitals:   12/31/13 0500 12/31/13 1351 12/31/13 2100 01/01/14 0500  BP: 149/74 117/54 123/56 130/73  Pulse: 76 85 95 85  Temp: 97.6 F (36.4 C) 98 F (36.7 C) 98.6 F (37 C) 98.2 F (36.8 C)  TempSrc:  Oral    Resp: 16 18 16 16   Height:      Weight: 62.3 kg (137 lb 5.6 oz)     SpO2: 97% 96% 99% 98%    Weight change:  Filed Weights   12/29/13 0508 12/30/13 0537 12/31/13 0500  Weight: 63.7 kg (140 lb 6.9 oz) 62.999 kg (138 lb 14.2 oz) 62.3 kg (137 lb 5.6 oz)   Body mass index is 24.34 kg/(m^2).   Gen Exam: a and o, appropriate Neck: Supple, No  JVD.   Chest: CTA without WRR CVS: S1 S2 Regular, no murmurs.  Abdomen: soft, BS +, non tender, non distended.  Extremities: no edema, lower extremities warm to touch Neurologic: Non Focal.  But generalized weakness positive   Intake/Output from previous day:  Intake/Output Summary (Last 24 hours) at 01/01/14 1350 Last data filed at 01/01/14 0835  Gross per 24 hour  Intake    480 ml  Output   2875 ml  Net  -2395 ml     LAB  RESULTS: CBC  Recent Labs Lab 12/27/13 2328 12/29/13 0536  WBC 6.9 8.9  HGB 14.6 12.4  HCT 43.3 37.4  PLT 360 292  MCV 86.8 87.8  MCH 29.3 29.1  MCHC 33.7 33.2  RDW 14.5 14.5  LYMPHSABS 2.1  --   MONOABS 0.5  --   EOSABS 0.2  --   BASOSABS 0.0  --     Chemistries   Recent Labs Lab 12/27/13 2328 12/29/13 0536  NA 134* 135*  K 4.1 4.1  CL 92* 97  CO2 26 25  GLUCOSE 150* 152*  BUN 31* 38*  CREATININE 1.20* 1.22*  CALCIUM 9.4 8.4    CBG: No results found for this basename: GLUCAP,  in the last 168 hours  GFR Estimated Creatinine Clearance: 22.3 ml/min (by C-G formula based on Cr of 1.22).  Coagulation profile  Recent Labs Lab 12/28/13 0232  INR 1.02    Cardiac Enzymes  Recent Labs Lab 12/28/13 0348 12/28/13 0935 12/28/13 1515  TROPONINI <0.30 <0.30 <0.30    No components found with this basename: POCBNP,  No results found for this basename: DDIMER,  in the last 72 hours No results found for this basename: HGBA1C,  in the last 72 hours No results found for this basename: CHOL, HDL, LDLCALC, TRIG, CHOLHDL, LDLDIRECT,  in the last 72 hours No results found for this basename: TSH, T4TOTAL, FREET3, T3FREE, THYROIDAB,  in the last 72 hours No results found for this basename: VITAMINB12, FOLATE, FERRITIN, TIBC, IRON, RETICCTPCT,  in the last 72 hours No results found for this basename: LIPASE, AMYLASE,  in the last 72 hours  Urine Studies No results found for this basename: UACOL, UAPR, USPG, UPH, UTP, UGL, UKET, UBIL, UHGB, UNIT, UROB, ULEU, UEPI, UWBC, URBC, UBAC, CAST, CRYS, UCOM, BILUA,  in the last 72 hours  MICROBIOLOGY: Recent Results (from the past 240 hour(s))  URINE CULTURE     Status: None   Collection Time    12/28/13 12:18 AM      Result Value Range Status   Specimen Description URINE, RANDOM   Final   Special Requests NONE   Final   Culture  Setup Time     Final   Value: 12/28/2013 09:27     Performed at [facility]     Final   Value: >=100,000 COLONIES/ML     Performed at [facility]   Culture     Final   Value: Multiple bacterial morphotypes present, none predominant. Suggest appropriate recollection if clinically indicated.     Performed at [facility]   Report Status 12/29/2013 FINAL   Final    RADIOLOGY STUDIES/RESULTS: Ct Abdomen Pelvis Wo Contrast  12/28/2013   CLINICAL DATA:  Abdominal pain and headache.  EXAM: CT ABDOMEN AND PELVIS WITHOUT CONTRAST  TECHNIQUE: Multidetector CT imaging of the abdomen and pelvis was performed following the standard protocol without intravenous contrast.  COMPARISON:  None.  FINDINGS: BODY WALL: Unremarkable.  LOWER CHEST: Cardiomegaly with coronary artery atherosclerosis and mitral annular calcification. There are moderate, layering bilateral pleural effusions and bibasilar atelectasis.  ABDOMEN/PELVIS:  Liver: No focal abnormality.  Biliary: No evidence of biliary obstruction or stone.  Pancreas: Unremarkable.  Spleen: Granulomatous changes.  Adrenals: Unremarkable.  Kidneys and ureters: No hydronephrosis or stone.  Bladder: Unremarkable.  Reproductive: Unremarkable.  Bowel: No bowel obstruction. Extensive distal colonic diverticulosis. Normal appendix.  Retroperitoneum: No mass or adenopathy.  Peritoneum: No free fluid or gas.  Vascular: Extensive aortic and branch vessel atherosclerosis.  OSSEOUS: Located right hip hemiarthroplasty. L1 and L2 compression fractures which are remote. The L2 compression has progressed since 03/22/2012. No acute fracture seen. Grade 1 L5-S1 anterolisthesis with advanced facet osteoarthritis.  IMPRESSION: 1. As permitted by respiratory motion, no acute intra-abdominal findings. 2. Moderate bilateral pleural effusions with atelectasis.   Electronically Signed   By: Tiburcio PeaJonathan  Watts M.D.   On: 12/28/2013 02:58   Dg Chest 2 View  12/07/2013   CLINICAL DATA:  Followup pneumonia  EXAM: CHEST  2 VIEW  COMPARISON:   12/03/2013  FINDINGS: Cardiac shadow remains enlarged. There has been significant improvement in degree of vascular congestion. Minimal atelectasis is noted in the left lung base. No other focal abnormality is seen.  IMPRESSION: Significant improvement in vascular congestion and left basilar infiltrate. Minimal residual changes in the left base are noted.   Electronically Signed   By: Alcide CleverMark  Lukens M.D.   On: 12/07/2013 07:58   X-ray Chest Pa And Lateral   12/01/2013   CLINICAL DATA:  Shortness of breath, weakness, history coronary artery disease post MI, CHF, hypertension, COPD  EXAM: CHEST  2 VIEW  COMPARISON:  07/04/2013  FINDINGS: Enlargement of cardiac silhouette with pulmonary vascular congestion.  Scattered interstitial prominence greater on left likely representing mildly asymmetric pulmonary edema and CHF.  Atherosclerotic calcification aorta.  No segmental consolidation or pneumothorax.  Dependent small posterior pleural effusions bilaterally.  Skin folds project over right lung on the PA view.  Diffuse osseous demineralization.  IMPRESSION: Enlargement of cardiac silhouette with pulmonary vascular congestion.  Mild CHF with small dependent pleural effusions bilaterally.   Electronically Signed   By: Ulyses SouthwardMark  Boles M.D.   On: 12/01/2013 17:23   Ct Head Wo Contrast  12/28/2013   CLINICAL DATA:  Headache  EXAM: CT HEAD WITHOUT CONTRAST  TECHNIQUE: Contiguous axial images were obtained from the base of the skull through the vertex without intravenous contrast.  COMPARISON:  Prior CT from 02/15/2008  FINDINGS: Diffuse age-related cerebral atrophy with chronic microvascular ischemic disease is present. Findings are advanced as compared to most recent CT examination from 02/15/2008.  Right parafalcine hyperdensity measuring 1.7 x 1.3 cm is seen along the anterior falx (series 2, image 19). Second focus of hyperdensity seen within the right parafalcine region is located slightly more cephalad and measures 1.6  by is 0.7 cm. Findings are most consistent with acute/ subacute subdural hemorrhage. There is no significant mass effect about these bleeds. No midline shift or hydrocephalus. No other intracranial hemorrhage.  The calvarium is intact. Orbits are normal. Paranasal sinuses and mastoid air cells are clear.  IMPRESSION: 1. Acute/early subacute right parafalcine subdural hemorrhage as above. No significant mass effect, midline shift, or hydrocephalus. 2. Advanced age-related atrophy with chronic microvascular ischemic disease, progressed as compared to 2009. Critical Value/emergent results were called by telephone at the time of interpretation on 12/28/2013 at 2:26 AM to Dr. Glynn OctaveSTEPHEN RANCOUR ,  who verbally acknowledged these results.   Electronically Signed   By: Rise Mu M.D.   On: 12/28/2013 02:29   Dg Chest Portable 1 View  12/28/2013   CLINICAL DATA:  Abdominal pain and headache  EXAM: PORTABLE CHEST - 1 VIEW  COMPARISON:  12/07/2013  FINDINGS: Cardiopericardial enlargement which is chronic. Diffuse interstitial coarsening. Kerley B-lines. Bilateral pleural effusions which are layering. No pneumothorax.  IMPRESSION: CHF, including pleural effusions.   Electronically Signed   By: Tiburcio Pea M.D.   On: 12/28/2013 00:59   Dg Chest Port 1 View  12/03/2013   CLINICAL DATA:  History of congestive failure  EXAM: PORTABLE CHEST - 1 VIEW  COMPARISON:  For 12/01/2013  FINDINGS: Cardiac shadow is stable. The left basilar infiltrate with associated effusion is now noted. Vascular congestion with pulmonary edema is also noted. No bony abnormality is seen.  IMPRESSION: Congestive failure with new left basilar infiltrate   Electronically Signed   By: Alcide Clever M.D.   On: 12/03/2013 07:23   US Abdomen Limited Ruq  12/28/2013   CLINICAL DATA:  Right upper quadrant pain  EXAM: US ABDOMEN LIMITED - RIGHT UPPER QUADRANT  COMPARISON:  None.  FINDINGS: Gallbladder  Few mid level echoes may represent sludge. No  stones. No wall thickening or sonographic Murphy sign.  Common bile duct  Diameter: 2 mm  Liver:  No focal lesion identified. Within normal limits in parenchymal echogenicity.  Other:  Moderate right pleural effusion.  IMPRESSION: 1. Negative for cholelithiasis or acute cholecystitis. 2. Right pleural effusion.   Electronically Signed   By: Tiburcio Pea M.D.   On: 12/28/2013 02:13    Christiane Ha, MD  [facility] Pager:[phone]  If 7PM-7AM, please contact night-coverage [email or web address] Password [email or web address] 01/01/2014, 1:50 PM   LOS: 5 days

## 2014-01-02 DIAGNOSIS — I10 Essential (primary) hypertension: Secondary | ICD-10-CM

## 2014-01-02 MED ORDER — FUROSEMIDE 40 MG PO TABS: 40.0000 mg | ORAL_TABLET | Freq: Two times a day (BID) | ORAL | Status: AC

## 2014-01-02 MED ORDER — ALBUTEROL SULFATE (2.5 MG/3ML) 0.083% IN NEBU: 2.5000 mg | INHALATION_SOLUTION | RESPIRATORY_TRACT | Status: AC | PRN

## 2014-01-02 MED ORDER — ACETAMINOPHEN 325 MG PO TABS: 650.0000 mg | ORAL_TABLET | ORAL | Status: AC | PRN

## 2014-01-02 MED ORDER — LISINOPRIL 2.5 MG PO TABS: 2.5000 mg | ORAL_TABLET | Freq: Every day | ORAL | Status: AC

## 2014-01-02 NOTE — Progress Notes (Signed)
Physical Therapy Treatment Patient Details Name: [name] MRN: [id number] DOB: [birth year] Today's Date: 01/02/2014 Time: 1010-1029 PT Time Calculation (min): 19 min  PT Assessment / Plan / Recommendation  History of Present Illness [name] is a 80 y.o. female who presents to the ED with report of "a four-day history of upper abdominal pain and headache. She is asking for something for pain. She denies any vomiting though her grandson states that she had swollen up. She denies any fever, chills, change in bowel habits. Her headache is diffuse and gradual in onset. No focal weakness, numbness or tingling. Denies any falls. She denies any chest pain or shortness of breath. She's anxious and tachypnea. She is DO NOT RESUSCITATE per records which is confirmed by family." Per EDP   PT Comments   Pt continues to have very decreased activity tolerance during session, however was able to ambulate to restroom, perform peri care and assist with adjusting clothing prior to and following toileting, and was able to ambulate in hallway x 50'.  Ambulated on RA with SaO2 at 97% following all activity.  RN notified.  Also note HR up to 140 following mobility.   Follow Up Recommendations  SNF     Does the patient have the potential to tolerate intense rehabilitation     Barriers to Discharge        Equipment Recommendations  None recommended by PT    Recommendations for Other Services    Frequency Min 3X/week   Progress towards PT Goals Progress towards PT goals: Progressing toward goals  Plan Current plan remains appropriate    Precautions / Restrictions Precautions Precautions: Fall Precaution Comments: monitor HR Restrictions Weight Bearing Restrictions: No   Pertinent Vitals/Pain Pt with no c/o pain, however groans throughout all mobility.     Mobility  Bed Mobility Overal bed mobility: Modified Independent Transfers Overall transfer level: Needs assistance Equipment used:  Rolling walker (2 wheeled) Transfers: Sit to/from Stand Sit to Stand: Min assist General transfer comment: Requires assist for increased forward weight shift and at hips to attain upright standing, as well as controlled descent when sitting.  Performed x 2 in order to use reg toilet in restroom.  Ambulation/Gait Ambulation/Gait assistance: Min assist Ambulation Distance (Feet): 50 Feet Assistive device: Rolling walker (2 wheeled) Gait Pattern/deviations: Step-through pattern;Shuffle;Decreased stance time - right;Decreased step length - left;Narrow base of support Gait velocity: decreased General Gait Details: Pt requires assist for balance and safety with cues for maintaining position inside of RW, esp with turns.     Exercises     PT Diagnosis:    PT Problem List:   PT Treatment Interventions:     PT Goals (current goals can now be found in the care plan section) Acute Rehab PT Goals PT Goal Formulation: With patient Time For Goal Achievement: 01/05/14 Potential to Achieve Goals: Good  Visit Information  Last PT Received On: 01/02/14 Assistance Needed: +1 History of Present Illness: [name] is a 80 y.o. female who presents to the ED with report of "a four-day history of upper abdominal pain and headache. She is asking for something for pain. She denies any vomiting though her grandson states that she had swollen up. She denies any fever, chills, change in bowel habits. Her headache is diffuse and gradual in onset. No focal weakness, numbness or tingling. Denies any falls. She denies any chest pain or shortness of breath. She's anxious and tachypnea. She is DO NOT RESUSCITATE per records  which is confirmed by family." Per EDP    Subjective Data      Cognition  Cognition Arousal/Alertness: Awake/alert Behavior During Therapy: WFL for tasks assessed/performed    Balance     End of Session PT - End of Session Equipment Utilized During Treatment: Gait belt Activity  Tolerance: Patient limited by fatigue Patient left: in bed;with call bell/phone within reach;with bed alarm set Nurse Communication: Mobility status;Other (comment) (SaO2 with amb)   GP     Onisha Cedeno, Meribeth Mattes 01/02/2014, 10:36 AM

## 2014-01-02 NOTE — Discharge Summary (Signed)
Physician Discharge Summary  [name] JXB:[id number] DOB: 1915-08-27 DOA: 12/27/2013  PCP: [name], MD  Admit date: 12/27/2013 Discharge date: 01/02/2014  Time spent: greater than 30 min  Recommendations for Outpatient Follow-up:  1. Patient requests no further rehospitalizations 2. Should patient deteriorate, consider comfort care.  Discharge Diagnoses:  Principal Problem:   Acute respiratory failure with hypercapnia Active Problems:   Acute on chronic systolic CHF (congestive heart failure), NYHA class 2   UTI (lower urinary tract infection) subdural hematoma Debility Copd Atrial fibrillation CAD Protein calorie malnutrition Abdominal pain  Discharge Condition: stable  Filed Weights   12/29/13 0508 12/30/13 0537 12/31/13 0500  Weight: 63.7 kg (140 lb 6.9 oz) 62.999 kg (138 lb 14.2 oz) 62.3 kg (137 lb 5.6 oz)    History of present illness:  80 y.o. female who presents to the ED with report of "a four-day history of upper abdominal pain and headache. She is asking for something for pain. She denies any vomiting though her grandson states that she had swollen up. She denies any fever, chills, change in bowel habits. Her headache is diffuse and gradual in onset. No focal weakness, numbness or tingling. Denies any falls. She denies any chest pain or shortness of breath. She's anxious and tachypnea. She is DO NOT RESUSCITATE per records which is confirmed by family." Per EDP  Unfortunately since arrival in the ED her mental and respiratory status have deteriorated after being given fentanyl for pain and the patient is able to provide little more in the way of subjective history.  Work up demonstrates findings of severe CHF with large B pleural effusions, acute hypercapnic and hypoxic respiratory failure, a subacute SDH which is minor (and neuro surgery has no intervention plans on anyhow). Imaging of her abdomen and pelvis including CT and RUQ ultrasound is unremarkable for a  source of her pain. UA does demonstrate UTI, she is started on primaxin due to a history of ESBL in the past. Her mental status is improving slightly after narcan, lasix, and primaxin.   Hospital Course:  Initially, found to be in hypercarbic respiratory failure, started on BiPAP and admitted to stepdown unit. Etiology felt to be multifactorial: acute CHF, pain medication, and possibly COPD component. Pain medications were held. All blood thinners and antiplatelet agents were held. The patient was quickly taken off BiPAP and maintained normal oxygen saturations on nasal cannula. She had no wheezing noted the day after admission. She received IV Lasix. Antibiotics for UTI. ACE inhibitor was started. Echocardiogram was done in 12 2014 which showed an ejection fraction of 30%. Abdominal pain resolved. Dr. Debby Bud, PCP, made a social visit and recommended hospice and palliative care consult. Patient and family were receptive. Palliative care was consulted. The patient herself has voiced that she does not want to be rehospitalized, but family is not yet ready for complete comfort measures. Patient worked with physical therapy who has recommended skilled nursing facility. She has been stable and will go to skilled nursing facility to see whether her functional status can be improved to the point that she is able to go back home. Should she decline further, will need to discuss transition to comfort care.  Procedures:  none  Consultations:  Palliative care medicine  Discharge Exam: Filed Vitals:   01/02/14 1200  BP: 147/83  Pulse: 134  Temp: 99.3 F (37.4 C)  Resp: 17    General: asleep, arousable, comfortable Cardiovascular: irreg irreg Respiratory: CTA without WRR Abd: s, NT, ND  Ext no cce  Discharge Instructions  Discharge Orders   Future Orders Complete By Expires   Diet - low sodium heart healthy  As directed    Walk with assistance  As directed        Medication List    STOP  taking these medications       aspirin 325 MG tablet     cilostazol 100 MG tablet  Commonly known as:  PLETAL     clopidogrel 75 MG tablet  Commonly known as:  PLAVIX     feeding supplement (ENSURE COMPLETE) Liqd     triamcinolone cream 0.1 %  Commonly known as:  KENALOG     zinc oxide 11.3 % Crea cream  Commonly known as:  BALMEX      TAKE these medications       acetaminophen 325 MG tablet  Commonly known as:  TYLENOL  Take 2 tablets (650 mg total) by mouth every 4 (four) hours as needed for mild pain, fever or headache.     albuterol (2.5 MG/3ML) 0.083% nebulizer solution  Commonly known as:  PROVENTIL  Take 3 mLs (2.5 mg total) by nebulization every 2 (two) hours as needed for wheezing or shortness of breath.     diltiazem 240 MG 24 hr capsule  Commonly known as:  DILACOR XR  Take 240 mg by mouth daily.     fluticasone 50 MCG/ACT nasal spray  Commonly known as:  FLONASE  Place 2 sprays into the nose daily as needed for rhinitis or allergies.     furosemide 40 MG tablet  Commonly known as:  LASIX  Take 1 tablet (40 mg total) by mouth 2 (two) times daily.     lisinopril 2.5 MG tablet  Commonly known as:  PRINIVIL,ZESTRIL  Take 1 tablet (2.5 mg total) by mouth daily.     pantoprazole 40 MG tablet  Commonly known as:  PROTONIX  Take 40 mg by mouth daily.     polyethylene glycol packet  Commonly known as:  MIRALAX / GLYCOLAX  Take 17 g by mouth daily as needed.       Allergies  Allergen Reactions  . Morphine And Related Shortness Of Breath    Family reports altered mental status with respiratory distress when given fentanyl and morphine products.  . Codeine Phosphate     Makes her hurt  . Doxycycline Other (See Comments)    unknown  . Erythromycin Other (See Comments)    unknown      The results of significant diagnostics from this hospitalization (including imaging, microbiology, ancillary and laboratory) are listed below for reference.     Significant Diagnostic Studies: Ct Abdomen Pelvis Wo Contrast  12/28/2013   CLINICAL DATA:  Abdominal pain and headache.  EXAM: CT ABDOMEN AND PELVIS WITHOUT CONTRAST  TECHNIQUE: Multidetector CT imaging of the abdomen and pelvis was performed following the standard protocol without intravenous contrast.  COMPARISON:  None.  FINDINGS: BODY WALL: Unremarkable.  LOWER CHEST: Cardiomegaly with coronary artery atherosclerosis and mitral annular calcification. There are moderate, layering bilateral pleural effusions and bibasilar atelectasis.  ABDOMEN/PELVIS:  Liver: No focal abnormality.  Biliary: No evidence of biliary obstruction or stone.  Pancreas: Unremarkable.  Spleen: Granulomatous changes.  Adrenals: Unremarkable.  Kidneys and ureters: No hydronephrosis or stone.  Bladder: Unremarkable.  Reproductive: Unremarkable.  Bowel: No bowel obstruction. Extensive distal colonic diverticulosis. Normal appendix.  Retroperitoneum: No mass or adenopathy.  Peritoneum: No free fluid or gas.  Vascular: Extensive aortic  and branch vessel atherosclerosis.  OSSEOUS: Located right hip hemiarthroplasty. L1 and L2 compression fractures which are remote. The L2 compression has progressed since 03/22/2012. No acute fracture seen. Grade 1 L5-S1 anterolisthesis with advanced facet osteoarthritis.  IMPRESSION: 1. As permitted by respiratory motion, no acute intra-abdominal findings. 2. Moderate bilateral pleural effusions with atelectasis.   Electronically Signed   By: Tiburcio PeaJonathan  Watts M.D.   On: 12/28/2013 02:58   Dg Chest 2 View  12/07/2013   CLINICAL DATA:  Followup pneumonia  EXAM: CHEST  2 VIEW  COMPARISON:  12/03/2013  FINDINGS: Cardiac shadow remains enlarged. There has been significant improvement in degree of vascular congestion. Minimal atelectasis is noted in the left lung base. No other focal abnormality is seen.  IMPRESSION: Significant improvement in vascular congestion and left basilar infiltrate. Minimal residual  changes in the left base are noted.   Electronically Signed   By: Alcide CleverMark  Lukens M.D.   On: 12/07/2013 07:58   Ct Head Wo Contrast  12/28/2013   CLINICAL DATA:  Headache  EXAM: CT HEAD WITHOUT CONTRAST  TECHNIQUE: Contiguous axial images were obtained from the base of the skull through the vertex without intravenous contrast.  COMPARISON:  Prior CT from 02/15/2008  FINDINGS: Diffuse age-related cerebral atrophy with chronic microvascular ischemic disease is present. Findings are advanced as compared to most recent CT examination from 02/15/2008.  Right parafalcine hyperdensity measuring 1.7 x 1.3 cm is seen along the anterior falx (series 2, image 19). Second focus of hyperdensity seen within the right parafalcine region is located slightly more cephalad and measures 1.6 by is 0.7 cm. Findings are most consistent with acute/ subacute subdural hemorrhage. There is no significant mass effect about these bleeds. No midline shift or hydrocephalus. No other intracranial hemorrhage.  The calvarium is intact. Orbits are normal. Paranasal sinuses and mastoid air cells are clear.  IMPRESSION: 1. Acute/early subacute right parafalcine subdural hemorrhage as above. No significant mass effect, midline shift, or hydrocephalus. 2. Advanced age-related atrophy with chronic microvascular ischemic disease, progressed as compared to 2009. Critical Value/emergent results were called by telephone at the time of interpretation on 12/28/2013 at 2:26 AM to Dr. Glynn OctaveSTEPHEN RANCOUR , who verbally acknowledged these results.   Electronically Signed   By: Rise MuBenjamin  McClintock M.D.   On: 12/28/2013 02:29   Dg Chest Port 1 View  12/29/2013   CLINICAL DATA:  Shortness of breath  EXAM: PORTABLE CHEST - 1 VIEW  COMPARISON:  DG CHEST 1V PORT dated 12/28/2013; DG CHEST 2 VIEW dated 12/07/2013; DG CHEST 1V PORT dated 12/03/2013; DG CHEST 2 VIEW dated 12/01/2013  FINDINGS: There are bilateral small pleural effusions. There is bilateral interstitial and  alveolar airspace opacities. There is no pneumothorax. Stable cardiomegaly. The osseous structures are unremarkable.  IMPRESSION: Findings most consistent with congestive failure. No significant interval change.   Electronically Signed   By: Elige KoHetal  Patel   On: 12/29/2013 08:04   Dg Chest Portable 1 View  12/28/2013   CLINICAL DATA:  Abdominal pain and headache  EXAM: PORTABLE CHEST - 1 VIEW  COMPARISON:  12/07/2013  FINDINGS: Cardiopericardial enlargement which is chronic. Diffuse interstitial coarsening. Kerley B-lines. Bilateral pleural effusions which are layering. No pneumothorax.  IMPRESSION: CHF, including pleural effusions.   Electronically Signed   By: Tiburcio PeaJonathan  Watts M.D.   On: 12/28/2013 00:59   [place] Abdomen Limited Ruq  12/28/2013   CLINICAL DATA:  Right upper quadrant pain  EXAM: US ABDOMEN LIMITED - RIGHT UPPER QUADRANT  COMPARISON:  None.  FINDINGS: Gallbladder  Few mid level echoes may represent sludge. No stones. No wall thickening or sonographic Murphy sign.  Common bile duct  Diameter: 2 mm  Liver:  No focal lesion identified. Within normal limits in parenchymal echogenicity.  Other:  Moderate right pleural effusion.  IMPRESSION: 1. Negative for cholelithiasis or acute cholecystitis. 2. Right pleural effusion.   Electronically Signed   By: Tiburcio Pea M.D.   On: 12/28/2013 02:13    Microbiology: Recent Results (from the past 240 hour(s))  URINE CULTURE     Status: None   Collection Time    12/28/13 12:18 AM      Result Value Range Status   Specimen Description URINE, RANDOM   Final   Special Requests NONE   Final   Culture  Setup Time     Final   Value: 12/28/2013 09:27     Performed at [facility] Count     Final   Value: >=100,000 COLONIES/ML     Performed at [facility]   Culture     Final   Value: Multiple bacterial morphotypes present, none predominant. Suggest appropriate recollection if clinically indicated.     Performed at [facility]   Report Status 12/29/2013 FINAL   Final     Labs: Basic Metabolic Panel:  Recent Labs Lab 12/27/13 2328 12/29/13 0536  NA 134* 135*  K 4.1 4.1  CL 92* 97  CO2 26 25  GLUCOSE 150* 152*  BUN 31* 38*  CREATININE 1.20* 1.22*  CALCIUM 9.4 8.4   Liver Function Tests:  Recent Labs Lab 12/27/13 2328  AST 40*  ALT 46*  ALKPHOS 139*  BILITOT 0.4  PROT 6.8  ALBUMIN 3.1*    Recent Labs Lab 12/27/13 2328  LIPASE 38   No results found for this basename: AMMONIA,  in the last 168 hours CBC:  Recent Labs Lab 12/27/13 2328 12/29/13 0536  WBC 6.9 8.9  NEUTROABS 4.0  --   HGB 14.6 12.4  HCT 43.3 37.4  MCV 86.8 87.8  PLT 360 292   Cardiac Enzymes:  Recent Labs Lab 12/27/13 2254 12/28/13 0348 12/28/13 0935 12/28/13 1515  TROPONINI <0.30 <0.30 <0.30 <0.30   BNP: BNP (last 3 results)  Recent Labs  06/29/13 1155 12/01/13 1800 12/27/13 2254  PROBNP 635.0* 4315.0* 10512.0*   CBG: No results found for this basename: GLUCAP,  in the last 168 hours  EKG: atrial fib. LBBB   Signed:  Christiane Ha  [facility] 01/02/2014, 12:43 PM

## 2014-01-02 NOTE — Progress Notes (Signed)
CSW (Clinical Child psychotherapistocial Worker) continues to wait for insurance authorization. CSW has called and spoke with insurance representative and faxed updated clinicals. Will continue to follow up and communicate with facility.  Gabreal Worton, LCSWA [phone]

## 2014-01-02 NOTE — Progress Notes (Signed)
CSW (Clinical Child psychotherapistocial Worker) received call from facility informing that they had received insurance auth. CSW prepared pt dc packet and placed with shadow chart. CSW arranged non-emergent ambulance transport. Pt, pt family, pt nurse, and facility informed. CSW signing off.  Michaeleen Down, LCSWA [phone]

## 2014-01-03 ENCOUNTER — Telehealth: Payer: Self-pay | Admitting: *Deleted

## 2014-01-03 NOTE — Telephone Encounter (Signed)
Patient phoned stating [facility].  FYI.  Please advise if further instruction needed.

## 2014-01-15 ENCOUNTER — Telehealth: Payer: Self-pay | Admitting: Internal Medicine

## 2014-01-15 NOTE — Telephone Encounter (Signed)
[name] has been notified

## 2014-01-15 NOTE — Telephone Encounter (Signed)
No. Dr. Pete GlatterStoneking and others are the facility docs. I do not have privileges there.

## 2014-01-15 NOTE — Telephone Encounter (Signed)
Pt's daughter wants to know if Dr. Debby BudNorins is going to see [facility]Powell [facility].

## 2014-03-21 DEATH — deceased

## 2014-03-27 NOTE — Telephone Encounter (Signed)
error
